# Patient Record
Sex: Female | Born: 1935 | Race: White | Hispanic: No | Smoking: Never smoker
Health system: Southern US, Community
[De-identification: ages and names within clinical notes are randomized; demographics above are authoritative.]

## PROBLEM LIST (undated history)

## (undated) DIAGNOSIS — I428 Other cardiomyopathies: Secondary | ICD-10-CM

## (undated) DIAGNOSIS — N189 Chronic kidney disease, unspecified: Secondary | ICD-10-CM

## (undated) DIAGNOSIS — J45909 Unspecified asthma, uncomplicated: Secondary | ICD-10-CM

## (undated) DIAGNOSIS — R0602 Shortness of breath: Secondary | ICD-10-CM

## (undated) DIAGNOSIS — M1990 Unspecified osteoarthritis, unspecified site: Secondary | ICD-10-CM

## (undated) DIAGNOSIS — M797 Fibromyalgia: Secondary | ICD-10-CM

## (undated) DIAGNOSIS — I10 Essential (primary) hypertension: Secondary | ICD-10-CM

## (undated) DIAGNOSIS — R5382 Chronic fatigue, unspecified: Secondary | ICD-10-CM

## (undated) DIAGNOSIS — M8580 Other specified disorders of bone density and structure, unspecified site: Secondary | ICD-10-CM

## (undated) DIAGNOSIS — J449 Chronic obstructive pulmonary disease, unspecified: Secondary | ICD-10-CM

## (undated) DIAGNOSIS — R140 Abdominal distension (gaseous): Secondary | ICD-10-CM

## (undated) DIAGNOSIS — C3490 Malignant neoplasm of unspecified part of unspecified bronchus or lung: Secondary | ICD-10-CM

## (undated) DIAGNOSIS — K5792 Diverticulitis of intestine, part unspecified, without perforation or abscess without bleeding: Secondary | ICD-10-CM

## (undated) DIAGNOSIS — I447 Left bundle-branch block, unspecified: Secondary | ICD-10-CM

## (undated) DIAGNOSIS — I509 Heart failure, unspecified: Secondary | ICD-10-CM

## (undated) DIAGNOSIS — E790 Hyperuricemia without signs of inflammatory arthritis and tophaceous disease: Secondary | ICD-10-CM

## (undated) DIAGNOSIS — Z95810 Presence of automatic (implantable) cardiac defibrillator: Secondary | ICD-10-CM

## (undated) HISTORY — DX: Chronic kidney disease, unspecified: N18.9

## (undated) HISTORY — DX: Fibromyalgia: M79.7

## (undated) HISTORY — DX: Essential (primary) hypertension: I10

## (undated) HISTORY — DX: Abdominal distension (gaseous): R14.0

## (undated) HISTORY — PX: OTHER SURGICAL HISTORY: SHX169

## (undated) HISTORY — DX: Presence of automatic (implantable) cardiac defibrillator: Z95.810

## (undated) HISTORY — DX: Left bundle-branch block, unspecified: I44.7

## (undated) HISTORY — PX: APPENDECTOMY: SHX54

## (undated) HISTORY — PX: ABDOMINAL HYSTERECTOMY: SUR658

## (undated) HISTORY — PX: TONSILLECTOMY: SUR1361

## (undated) HISTORY — DX: Diverticulitis of intestine, part unspecified, without perforation or abscess without bleeding: K57.92

## (undated) HISTORY — DX: Chronic fatigue, unspecified: R53.82

## (undated) HISTORY — DX: Hyperuricemia without signs of inflammatory arthritis and tophaceous disease: E79.0

## (undated) HISTORY — PX: NECK SURGERY: SHX720

## (undated) HISTORY — DX: Other cardiomyopathies: I42.8

## (undated) HISTORY — PX: REPLACEMENT TOTAL KNEE: SUR1224

## (undated) HISTORY — DX: Shortness of breath: R06.02

## (undated) HISTORY — DX: Other specified disorders of bone density and structure, unspecified site: M85.80

---

## 1997-07-27 ENCOUNTER — Encounter: Admission: RE | Admit: 1997-07-27 | Discharge: 1997-10-25 | Payer: Self-pay | Admitting: Orthopaedic Surgery

## 1998-03-16 ENCOUNTER — Encounter: Payer: Self-pay | Admitting: Emergency Medicine

## 1998-03-16 ENCOUNTER — Emergency Department (HOSPITAL_COMMUNITY): Admission: EM | Admit: 1998-03-16 | Discharge: 1998-03-16 | Payer: Self-pay | Admitting: Emergency Medicine

## 1998-09-28 ENCOUNTER — Ambulatory Visit (HOSPITAL_COMMUNITY): Admission: RE | Admit: 1998-09-28 | Discharge: 1998-09-28 | Payer: Self-pay | Admitting: Gastroenterology

## 1998-10-02 ENCOUNTER — Ambulatory Visit (HOSPITAL_COMMUNITY): Admission: RE | Admit: 1998-10-02 | Discharge: 1998-10-02 | Payer: Self-pay | Admitting: Gastroenterology

## 1998-10-02 ENCOUNTER — Encounter: Payer: Self-pay | Admitting: Gastroenterology

## 1998-11-14 ENCOUNTER — Encounter: Payer: Self-pay | Admitting: Gastroenterology

## 1998-11-14 ENCOUNTER — Ambulatory Visit (HOSPITAL_COMMUNITY): Admission: RE | Admit: 1998-11-14 | Discharge: 1998-11-14 | Payer: Self-pay | Admitting: Gastroenterology

## 1999-03-27 ENCOUNTER — Encounter: Admission: RE | Admit: 1999-03-27 | Discharge: 1999-03-27 | Payer: Self-pay | Admitting: Family Medicine

## 1999-03-27 ENCOUNTER — Encounter: Payer: Self-pay | Admitting: Family Medicine

## 1999-04-03 ENCOUNTER — Encounter: Admission: RE | Admit: 1999-04-03 | Discharge: 1999-04-03 | Payer: Self-pay | Admitting: Family Medicine

## 1999-04-03 ENCOUNTER — Encounter: Payer: Self-pay | Admitting: Family Medicine

## 1999-07-11 ENCOUNTER — Encounter: Admission: RE | Admit: 1999-07-11 | Discharge: 1999-07-11 | Payer: Self-pay | Admitting: Family Medicine

## 1999-07-11 ENCOUNTER — Encounter: Payer: Self-pay | Admitting: Family Medicine

## 2000-07-08 ENCOUNTER — Encounter: Admission: RE | Admit: 2000-07-08 | Discharge: 2000-07-08 | Payer: Self-pay | Admitting: Family Medicine

## 2000-07-08 ENCOUNTER — Encounter: Payer: Self-pay | Admitting: Family Medicine

## 2000-07-13 ENCOUNTER — Encounter: Payer: Self-pay | Admitting: Family Medicine

## 2000-07-13 ENCOUNTER — Encounter: Admission: RE | Admit: 2000-07-13 | Discharge: 2000-07-13 | Payer: Self-pay | Admitting: Family Medicine

## 2000-12-01 ENCOUNTER — Encounter (INDEPENDENT_AMBULATORY_CARE_PROVIDER_SITE_OTHER): Payer: Self-pay | Admitting: Specialist

## 2000-12-01 ENCOUNTER — Ambulatory Visit (HOSPITAL_COMMUNITY): Admission: RE | Admit: 2000-12-01 | Discharge: 2000-12-01 | Payer: Self-pay | Admitting: Gastroenterology

## 2001-05-24 ENCOUNTER — Encounter: Payer: Self-pay | Admitting: Family Medicine

## 2001-05-24 ENCOUNTER — Encounter: Admission: RE | Admit: 2001-05-24 | Discharge: 2001-05-24 | Payer: Self-pay | Admitting: Family Medicine

## 2001-07-19 ENCOUNTER — Encounter: Payer: Self-pay | Admitting: Family Medicine

## 2001-07-19 ENCOUNTER — Encounter: Admission: RE | Admit: 2001-07-19 | Discharge: 2001-07-19 | Payer: Self-pay | Admitting: Family Medicine

## 2001-10-06 ENCOUNTER — Encounter: Payer: Self-pay | Admitting: Family Medicine

## 2001-10-06 ENCOUNTER — Encounter: Admission: RE | Admit: 2001-10-06 | Discharge: 2001-10-06 | Payer: Self-pay | Admitting: Family Medicine

## 2001-12-09 ENCOUNTER — Encounter: Payer: Self-pay | Admitting: Family Medicine

## 2001-12-09 ENCOUNTER — Encounter: Admission: RE | Admit: 2001-12-09 | Discharge: 2001-12-09 | Payer: Self-pay | Admitting: Family Medicine

## 2002-01-24 ENCOUNTER — Encounter: Payer: Self-pay | Admitting: Family Medicine

## 2002-01-24 ENCOUNTER — Encounter: Admission: RE | Admit: 2002-01-24 | Discharge: 2002-01-24 | Payer: Self-pay | Admitting: Family Medicine

## 2002-07-22 ENCOUNTER — Encounter: Admission: RE | Admit: 2002-07-22 | Discharge: 2002-07-22 | Payer: Self-pay | Admitting: Family Medicine

## 2002-07-22 ENCOUNTER — Encounter: Payer: Self-pay | Admitting: Family Medicine

## 2003-01-19 ENCOUNTER — Ambulatory Visit (HOSPITAL_COMMUNITY): Admission: RE | Admit: 2003-01-19 | Discharge: 2003-01-19 | Payer: Self-pay | Admitting: Interventional Cardiology

## 2003-01-19 ENCOUNTER — Encounter: Payer: Self-pay | Admitting: *Deleted

## 2003-01-24 ENCOUNTER — Ambulatory Visit (HOSPITAL_COMMUNITY): Admission: RE | Admit: 2003-01-24 | Discharge: 2003-01-24 | Payer: Self-pay | Admitting: *Deleted

## 2003-01-24 ENCOUNTER — Encounter: Payer: Self-pay | Admitting: *Deleted

## 2003-02-03 ENCOUNTER — Encounter: Payer: Self-pay | Admitting: Family Medicine

## 2003-02-03 ENCOUNTER — Encounter: Admission: RE | Admit: 2003-02-03 | Discharge: 2003-02-03 | Payer: Self-pay | Admitting: Family Medicine

## 2003-02-23 ENCOUNTER — Ambulatory Visit (HOSPITAL_COMMUNITY): Admission: RE | Admit: 2003-02-23 | Discharge: 2003-02-23 | Payer: Self-pay | Admitting: Orthopaedic Surgery

## 2003-07-24 ENCOUNTER — Encounter: Admission: RE | Admit: 2003-07-24 | Discharge: 2003-07-24 | Payer: Self-pay | Admitting: Family Medicine

## 2003-08-22 ENCOUNTER — Encounter: Admission: RE | Admit: 2003-08-22 | Discharge: 2003-08-22 | Payer: Self-pay | Admitting: Family Medicine

## 2004-01-08 ENCOUNTER — Encounter (INDEPENDENT_AMBULATORY_CARE_PROVIDER_SITE_OTHER): Payer: Self-pay | Admitting: Specialist

## 2004-01-08 ENCOUNTER — Ambulatory Visit (HOSPITAL_COMMUNITY): Admission: RE | Admit: 2004-01-08 | Discharge: 2004-01-08 | Payer: Self-pay | Admitting: Gastroenterology

## 2004-02-02 ENCOUNTER — Encounter: Admission: RE | Admit: 2004-02-02 | Discharge: 2004-02-02 | Payer: Self-pay | Admitting: Family Medicine

## 2004-05-21 ENCOUNTER — Inpatient Hospital Stay (HOSPITAL_COMMUNITY): Admission: RE | Admit: 2004-05-21 | Discharge: 2004-05-24 | Payer: Self-pay | Admitting: Orthopaedic Surgery

## 2004-05-22 ENCOUNTER — Ambulatory Visit: Payer: Self-pay | Admitting: Physical Medicine & Rehabilitation

## 2004-05-24 ENCOUNTER — Inpatient Hospital Stay
Admission: RE | Admit: 2004-05-24 | Discharge: 2004-05-31 | Payer: Self-pay | Admitting: Physical Medicine & Rehabilitation

## 2004-08-01 ENCOUNTER — Encounter: Admission: RE | Admit: 2004-08-01 | Discharge: 2004-08-01 | Payer: Self-pay | Admitting: Family Medicine

## 2005-05-22 ENCOUNTER — Encounter: Admission: RE | Admit: 2005-05-22 | Discharge: 2005-05-22 | Payer: Self-pay | Admitting: Family Medicine

## 2005-08-08 ENCOUNTER — Encounter: Admission: RE | Admit: 2005-08-08 | Discharge: 2005-08-08 | Payer: Self-pay | Admitting: Family Medicine

## 2005-12-19 ENCOUNTER — Ambulatory Visit (HOSPITAL_COMMUNITY): Admission: RE | Admit: 2005-12-19 | Discharge: 2005-12-19 | Payer: Self-pay | Admitting: *Deleted

## 2005-12-19 ENCOUNTER — Encounter: Payer: Self-pay | Admitting: Pulmonary Disease

## 2006-01-02 ENCOUNTER — Encounter: Admission: RE | Admit: 2006-01-02 | Discharge: 2006-01-02 | Payer: Self-pay | Admitting: Family Medicine

## 2006-01-15 ENCOUNTER — Ambulatory Visit: Payer: Self-pay | Admitting: Pulmonary Disease

## 2006-01-19 ENCOUNTER — Ambulatory Visit (HOSPITAL_COMMUNITY): Admission: RE | Admit: 2006-01-19 | Discharge: 2006-01-19 | Payer: Self-pay | Admitting: Pulmonary Disease

## 2006-01-19 ENCOUNTER — Encounter: Payer: Self-pay | Admitting: Pulmonary Disease

## 2006-01-20 ENCOUNTER — Ambulatory Visit (HOSPITAL_COMMUNITY): Admission: RE | Admit: 2006-01-20 | Discharge: 2006-01-20 | Payer: Self-pay | Admitting: Pulmonary Disease

## 2006-01-28 ENCOUNTER — Ambulatory Visit: Payer: Self-pay | Admitting: Pulmonary Disease

## 2006-01-29 ENCOUNTER — Ambulatory Visit: Payer: Self-pay | Admitting: Cardiology

## 2006-01-29 ENCOUNTER — Encounter: Payer: Self-pay | Admitting: Pulmonary Disease

## 2006-02-05 ENCOUNTER — Ambulatory Visit: Payer: Self-pay | Admitting: Pulmonary Disease

## 2006-03-09 ENCOUNTER — Ambulatory Visit: Payer: Self-pay | Admitting: Pulmonary Disease

## 2006-03-16 ENCOUNTER — Inpatient Hospital Stay (HOSPITAL_BASED_OUTPATIENT_CLINIC_OR_DEPARTMENT_OTHER): Admission: RE | Admit: 2006-03-16 | Discharge: 2006-03-16 | Payer: Self-pay | Admitting: Interventional Cardiology

## 2006-03-16 ENCOUNTER — Encounter: Payer: Self-pay | Admitting: Pulmonary Disease

## 2006-04-07 ENCOUNTER — Encounter: Payer: Self-pay | Admitting: Pulmonary Disease

## 2006-04-27 ENCOUNTER — Encounter: Admission: RE | Admit: 2006-04-27 | Discharge: 2006-04-27 | Payer: Self-pay | Admitting: Gastroenterology

## 2006-05-12 ENCOUNTER — Ambulatory Visit (HOSPITAL_COMMUNITY): Admission: RE | Admit: 2006-05-12 | Discharge: 2006-05-12 | Payer: Self-pay | Admitting: Gastroenterology

## 2006-08-12 ENCOUNTER — Encounter: Admission: RE | Admit: 2006-08-12 | Discharge: 2006-08-12 | Payer: Self-pay | Admitting: Family Medicine

## 2007-01-11 ENCOUNTER — Encounter: Payer: Self-pay | Admitting: Cardiology

## 2007-01-20 ENCOUNTER — Ambulatory Visit (HOSPITAL_COMMUNITY): Admission: RE | Admit: 2007-01-20 | Discharge: 2007-01-20 | Payer: Self-pay | Admitting: Neurosurgery

## 2007-02-27 HISTORY — PX: NECK SURGERY: SHX720

## 2007-03-10 ENCOUNTER — Inpatient Hospital Stay (HOSPITAL_COMMUNITY): Admission: RE | Admit: 2007-03-10 | Discharge: 2007-03-19 | Payer: Self-pay | Admitting: Neurosurgery

## 2007-03-12 ENCOUNTER — Ambulatory Visit: Payer: Self-pay | Admitting: Physical Medicine & Rehabilitation

## 2007-04-03 ENCOUNTER — Encounter: Payer: Self-pay | Admitting: Pulmonary Disease

## 2007-04-03 DIAGNOSIS — E785 Hyperlipidemia, unspecified: Secondary | ICD-10-CM

## 2007-04-03 DIAGNOSIS — J45909 Unspecified asthma, uncomplicated: Secondary | ICD-10-CM | POA: Insufficient documentation

## 2007-05-21 ENCOUNTER — Encounter: Payer: Self-pay | Admitting: Pulmonary Disease

## 2007-06-24 ENCOUNTER — Encounter: Admission: RE | Admit: 2007-06-24 | Discharge: 2007-09-22 | Payer: Self-pay | Admitting: Neurosurgery

## 2007-08-26 ENCOUNTER — Encounter: Admission: RE | Admit: 2007-08-26 | Discharge: 2007-08-26 | Payer: Self-pay | Admitting: Family Medicine

## 2007-09-23 ENCOUNTER — Encounter: Admission: RE | Admit: 2007-09-23 | Discharge: 2007-10-07 | Payer: Self-pay | Admitting: Neurosurgery

## 2008-07-05 ENCOUNTER — Encounter: Payer: Self-pay | Admitting: Cardiology

## 2008-07-05 ENCOUNTER — Encounter: Payer: Self-pay | Admitting: Internal Medicine

## 2008-07-10 ENCOUNTER — Encounter: Admission: RE | Admit: 2008-07-10 | Discharge: 2008-07-10 | Payer: Self-pay | Admitting: Interventional Cardiology

## 2008-09-20 ENCOUNTER — Encounter: Payer: Self-pay | Admitting: Internal Medicine

## 2008-09-27 ENCOUNTER — Encounter: Admission: RE | Admit: 2008-09-27 | Discharge: 2008-09-27 | Payer: Self-pay | Admitting: Family Medicine

## 2008-09-28 ENCOUNTER — Encounter: Payer: Self-pay | Admitting: Internal Medicine

## 2008-10-04 ENCOUNTER — Encounter: Payer: Self-pay | Admitting: Internal Medicine

## 2008-10-10 ENCOUNTER — Ambulatory Visit: Payer: Self-pay | Admitting: Internal Medicine

## 2008-10-10 DIAGNOSIS — I5022 Chronic systolic (congestive) heart failure: Secondary | ICD-10-CM

## 2008-10-10 DIAGNOSIS — I447 Left bundle-branch block, unspecified: Secondary | ICD-10-CM | POA: Insufficient documentation

## 2008-10-23 ENCOUNTER — Encounter: Payer: Self-pay | Admitting: Internal Medicine

## 2008-10-23 ENCOUNTER — Encounter: Payer: Self-pay | Admitting: Cardiology

## 2008-10-27 ENCOUNTER — Telehealth: Payer: Self-pay | Admitting: Internal Medicine

## 2008-11-06 ENCOUNTER — Encounter: Payer: Self-pay | Admitting: Internal Medicine

## 2008-11-21 ENCOUNTER — Telehealth: Payer: Self-pay | Admitting: Internal Medicine

## 2008-11-22 ENCOUNTER — Ambulatory Visit: Payer: Self-pay | Admitting: Internal Medicine

## 2008-11-22 DIAGNOSIS — R0602 Shortness of breath: Secondary | ICD-10-CM | POA: Insufficient documentation

## 2008-11-22 LAB — CONVERTED CEMR LAB
Basophils Relative: 1 % (ref 0.0–3.0)
CO2: 31 meq/L (ref 19–32)
Chloride: 103 meq/L (ref 96–112)
Eosinophils Absolute: 0.2 10*3/uL (ref 0.0–0.7)
Eosinophils Relative: 2.6 % (ref 0.0–5.0)
HCT: 35.6 % — ABNORMAL LOW (ref 36.0–46.0)
Hemoglobin: 12.3 g/dL (ref 12.0–15.0)
MCHC: 34.5 g/dL (ref 30.0–36.0)
MCV: 95.6 fL (ref 78.0–100.0)
Monocytes Absolute: 0.6 10*3/uL (ref 0.1–1.0)
Neutro Abs: 3.4 10*3/uL (ref 1.4–7.7)
Neutrophils Relative %: 58.7 % (ref 43.0–77.0)
Potassium: 4.3 meq/L (ref 3.5–5.1)
Prothrombin Time: 10.9 s (ref 10.9–13.3)
RBC: 3.73 M/uL — ABNORMAL LOW (ref 3.87–5.11)
Sodium: 140 meq/L (ref 135–145)
WBC: 6 10*3/uL (ref 4.5–10.5)

## 2008-11-29 ENCOUNTER — Inpatient Hospital Stay (HOSPITAL_COMMUNITY): Admission: AD | Admit: 2008-11-29 | Discharge: 2008-11-30 | Payer: Self-pay | Admitting: Internal Medicine

## 2008-11-29 ENCOUNTER — Ambulatory Visit: Payer: Self-pay | Admitting: Internal Medicine

## 2008-11-30 ENCOUNTER — Encounter: Payer: Self-pay | Admitting: Cardiology

## 2008-11-30 ENCOUNTER — Encounter: Payer: Self-pay | Admitting: Internal Medicine

## 2008-12-04 ENCOUNTER — Telehealth (INDEPENDENT_AMBULATORY_CARE_PROVIDER_SITE_OTHER): Payer: Self-pay | Admitting: *Deleted

## 2008-12-04 ENCOUNTER — Telehealth: Payer: Self-pay | Admitting: Internal Medicine

## 2008-12-14 ENCOUNTER — Encounter: Payer: Self-pay | Admitting: Internal Medicine

## 2008-12-14 ENCOUNTER — Ambulatory Visit: Payer: Self-pay

## 2008-12-15 ENCOUNTER — Encounter: Payer: Self-pay | Admitting: Internal Medicine

## 2008-12-20 ENCOUNTER — Telehealth: Payer: Self-pay | Admitting: Internal Medicine

## 2008-12-21 ENCOUNTER — Ambulatory Visit: Payer: Self-pay

## 2008-12-21 ENCOUNTER — Encounter: Payer: Self-pay | Admitting: Internal Medicine

## 2008-12-21 ENCOUNTER — Telehealth (INDEPENDENT_AMBULATORY_CARE_PROVIDER_SITE_OTHER): Payer: Self-pay | Admitting: *Deleted

## 2008-12-22 ENCOUNTER — Telehealth: Payer: Self-pay | Admitting: Internal Medicine

## 2008-12-25 ENCOUNTER — Emergency Department (HOSPITAL_COMMUNITY): Admission: EM | Admit: 2008-12-25 | Discharge: 2008-12-25 | Payer: Self-pay | Admitting: Emergency Medicine

## 2008-12-25 ENCOUNTER — Ambulatory Visit: Payer: Self-pay | Admitting: Internal Medicine

## 2008-12-25 ENCOUNTER — Encounter: Payer: Self-pay | Admitting: Internal Medicine

## 2008-12-25 ENCOUNTER — Telehealth: Payer: Self-pay | Admitting: Nurse Practitioner

## 2008-12-27 ENCOUNTER — Telehealth: Payer: Self-pay | Admitting: Internal Medicine

## 2009-01-03 ENCOUNTER — Telehealth: Payer: Self-pay | Admitting: Internal Medicine

## 2009-01-05 ENCOUNTER — Telehealth (INDEPENDENT_AMBULATORY_CARE_PROVIDER_SITE_OTHER): Payer: Self-pay | Admitting: *Deleted

## 2009-01-08 ENCOUNTER — Telehealth (INDEPENDENT_AMBULATORY_CARE_PROVIDER_SITE_OTHER): Payer: Self-pay | Admitting: *Deleted

## 2009-01-10 ENCOUNTER — Ambulatory Visit: Payer: Self-pay

## 2009-01-10 ENCOUNTER — Ambulatory Visit: Payer: Self-pay | Admitting: Internal Medicine

## 2009-01-10 LAB — CONVERTED CEMR LAB: Pro B Natriuretic peptide (BNP): 62 pg/mL

## 2009-01-18 ENCOUNTER — Encounter: Payer: Self-pay | Admitting: Internal Medicine

## 2009-01-23 ENCOUNTER — Telehealth: Payer: Self-pay | Admitting: Internal Medicine

## 2009-01-25 ENCOUNTER — Telehealth (INDEPENDENT_AMBULATORY_CARE_PROVIDER_SITE_OTHER): Payer: Self-pay | Admitting: *Deleted

## 2009-03-02 ENCOUNTER — Ambulatory Visit: Payer: Self-pay | Admitting: Internal Medicine

## 2009-03-20 ENCOUNTER — Ambulatory Visit: Payer: Self-pay | Admitting: Internal Medicine

## 2009-04-03 ENCOUNTER — Encounter: Payer: Self-pay | Admitting: Internal Medicine

## 2009-05-11 ENCOUNTER — Encounter: Payer: Self-pay | Admitting: Pulmonary Disease

## 2009-06-04 ENCOUNTER — Encounter: Admission: RE | Admit: 2009-06-04 | Discharge: 2009-06-25 | Payer: Self-pay | Admitting: Neurosurgery

## 2009-06-11 ENCOUNTER — Telehealth: Payer: Self-pay | Admitting: Internal Medicine

## 2009-06-13 ENCOUNTER — Ambulatory Visit: Payer: Self-pay

## 2009-06-13 ENCOUNTER — Telehealth (INDEPENDENT_AMBULATORY_CARE_PROVIDER_SITE_OTHER): Payer: Self-pay | Admitting: *Deleted

## 2009-06-18 ENCOUNTER — Encounter: Payer: Self-pay | Admitting: Cardiology

## 2009-07-06 ENCOUNTER — Ambulatory Visit: Payer: Self-pay | Admitting: Cardiology

## 2009-07-10 ENCOUNTER — Encounter: Payer: Self-pay | Admitting: Cardiology

## 2009-07-16 ENCOUNTER — Ambulatory Visit: Payer: Self-pay | Admitting: Cardiology

## 2009-07-20 ENCOUNTER — Encounter: Payer: Self-pay | Admitting: Cardiology

## 2009-07-27 ENCOUNTER — Ambulatory Visit (HOSPITAL_COMMUNITY): Admission: RE | Admit: 2009-07-27 | Discharge: 2009-07-27 | Payer: Self-pay | Admitting: Neurosurgery

## 2009-08-01 ENCOUNTER — Encounter: Payer: Self-pay | Admitting: Cardiology

## 2009-08-06 ENCOUNTER — Ambulatory Visit (HOSPITAL_COMMUNITY): Admission: RE | Admit: 2009-08-06 | Discharge: 2009-08-06 | Payer: Self-pay | Admitting: Cardiology

## 2009-08-06 ENCOUNTER — Encounter: Payer: Self-pay | Admitting: Cardiology

## 2009-08-06 ENCOUNTER — Ambulatory Visit: Payer: Self-pay | Admitting: Cardiology

## 2009-08-06 ENCOUNTER — Ambulatory Visit: Payer: Self-pay | Admitting: Internal Medicine

## 2009-08-06 ENCOUNTER — Ambulatory Visit: Payer: Self-pay

## 2009-08-06 LAB — CONVERTED CEMR LAB
Basophils Relative: 0.7 % (ref 0.0–3.0)
CO2: 32 meq/L (ref 19–32)
Chloride: 104 meq/L (ref 96–112)
Creatinine, Ser: 1.6 mg/dL — ABNORMAL HIGH (ref 0.4–1.2)
Eosinophils Relative: 2.6 % (ref 0.0–5.0)
HCT: 33.6 % — ABNORMAL LOW (ref 36.0–46.0)
Hemoglobin: 11.5 g/dL — ABNORMAL LOW (ref 12.0–15.0)
Lymphs Abs: 1.6 10*3/uL (ref 0.7–4.0)
MCV: 95.9 fL (ref 78.0–100.0)
Monocytes Absolute: 0.6 10*3/uL (ref 0.1–1.0)
Monocytes Relative: 7.3 % (ref 3.0–12.0)
Neutro Abs: 5.9 10*3/uL (ref 1.4–7.7)
Potassium: 4.5 meq/L (ref 3.5–5.1)
Pro B Natriuretic peptide (BNP): 56 pg/mL (ref 0.0–100.0)
WBC: 8.4 10*3/uL (ref 4.5–10.5)

## 2009-08-09 ENCOUNTER — Inpatient Hospital Stay (HOSPITAL_BASED_OUTPATIENT_CLINIC_OR_DEPARTMENT_OTHER): Admission: RE | Admit: 2009-08-09 | Discharge: 2009-08-09 | Payer: Self-pay | Admitting: Cardiology

## 2009-08-27 ENCOUNTER — Ambulatory Visit: Payer: Self-pay | Admitting: Cardiology

## 2009-09-04 ENCOUNTER — Telehealth: Payer: Self-pay | Admitting: Cardiology

## 2009-09-21 ENCOUNTER — Encounter: Payer: Self-pay | Admitting: Internal Medicine

## 2009-09-25 ENCOUNTER — Ambulatory Visit: Payer: Self-pay | Admitting: Internal Medicine

## 2009-09-25 DIAGNOSIS — I959 Hypotension, unspecified: Secondary | ICD-10-CM

## 2009-10-01 ENCOUNTER — Encounter: Payer: Self-pay | Admitting: Cardiology

## 2009-10-01 ENCOUNTER — Ambulatory Visit: Payer: Self-pay | Admitting: Internal Medicine

## 2009-10-01 ENCOUNTER — Ambulatory Visit (HOSPITAL_COMMUNITY): Admission: RE | Admit: 2009-10-01 | Discharge: 2009-10-01 | Payer: Self-pay | Admitting: Cardiology

## 2009-10-04 ENCOUNTER — Encounter: Admission: RE | Admit: 2009-10-04 | Discharge: 2009-10-04 | Payer: Self-pay | Admitting: Family Medicine

## 2009-10-05 ENCOUNTER — Telehealth: Payer: Self-pay | Admitting: Cardiology

## 2009-10-10 ENCOUNTER — Ambulatory Visit: Payer: Self-pay | Admitting: Cardiology

## 2009-10-31 ENCOUNTER — Ambulatory Visit: Payer: Self-pay | Admitting: Pulmonary Disease

## 2009-11-02 ENCOUNTER — Encounter: Payer: Self-pay | Admitting: Pulmonary Disease

## 2009-11-02 ENCOUNTER — Ambulatory Visit: Admission: RE | Admit: 2009-11-02 | Discharge: 2009-11-02 | Payer: Self-pay | Admitting: Pulmonary Disease

## 2009-11-14 ENCOUNTER — Encounter: Payer: Self-pay | Admitting: Pulmonary Disease

## 2009-11-19 ENCOUNTER — Telehealth (INDEPENDENT_AMBULATORY_CARE_PROVIDER_SITE_OTHER): Payer: Self-pay | Admitting: *Deleted

## 2009-11-27 ENCOUNTER — Ambulatory Visit: Payer: Self-pay | Admitting: Cardiology

## 2009-12-06 ENCOUNTER — Ambulatory Visit: Payer: Self-pay | Admitting: Pulmonary Disease

## 2009-12-17 ENCOUNTER — Ambulatory Visit: Payer: Self-pay | Admitting: Internal Medicine

## 2009-12-20 ENCOUNTER — Telehealth: Payer: Self-pay | Admitting: Internal Medicine

## 2009-12-21 ENCOUNTER — Telehealth (INDEPENDENT_AMBULATORY_CARE_PROVIDER_SITE_OTHER): Payer: Self-pay | Admitting: *Deleted

## 2009-12-27 ENCOUNTER — Encounter: Payer: Self-pay | Admitting: Pulmonary Disease

## 2009-12-28 ENCOUNTER — Encounter: Payer: Self-pay | Admitting: Cardiology

## 2010-01-07 ENCOUNTER — Telehealth: Payer: Self-pay | Admitting: Pulmonary Disease

## 2010-01-15 ENCOUNTER — Telehealth (INDEPENDENT_AMBULATORY_CARE_PROVIDER_SITE_OTHER): Payer: Self-pay | Admitting: *Deleted

## 2010-01-16 ENCOUNTER — Ambulatory Visit: Payer: Self-pay | Admitting: Pulmonary Disease

## 2010-01-22 ENCOUNTER — Telehealth: Payer: Self-pay | Admitting: Pulmonary Disease

## 2010-01-22 ENCOUNTER — Telehealth (INDEPENDENT_AMBULATORY_CARE_PROVIDER_SITE_OTHER): Payer: Self-pay | Admitting: *Deleted

## 2010-01-28 ENCOUNTER — Telehealth (INDEPENDENT_AMBULATORY_CARE_PROVIDER_SITE_OTHER): Payer: Self-pay | Admitting: *Deleted

## 2010-02-13 ENCOUNTER — Ambulatory Visit: Payer: Self-pay | Admitting: Pulmonary Disease

## 2010-02-18 ENCOUNTER — Telehealth: Payer: Self-pay | Admitting: Pulmonary Disease

## 2010-03-13 ENCOUNTER — Ambulatory Visit: Payer: Self-pay | Admitting: Internal Medicine

## 2010-03-14 ENCOUNTER — Ambulatory Visit: Payer: Self-pay | Admitting: Cardiology

## 2010-03-14 DIAGNOSIS — R05 Cough: Secondary | ICD-10-CM

## 2010-03-14 DIAGNOSIS — R079 Chest pain, unspecified: Secondary | ICD-10-CM

## 2010-04-01 ENCOUNTER — Ambulatory Visit: Payer: Self-pay | Admitting: Pulmonary Disease

## 2010-05-03 ENCOUNTER — Encounter: Payer: Self-pay | Admitting: Pulmonary Disease

## 2010-05-19 ENCOUNTER — Encounter: Payer: Self-pay | Admitting: Family Medicine

## 2010-05-26 LAB — CONVERTED CEMR LAB
BUN: 21 mg/dL (ref 6–23)
CO2: 30 meq/L (ref 19–32)
Chloride: 103 meq/L (ref 96–112)
Creatinine, Ser: 1.2 mg/dL (ref 0.4–1.2)
Potassium: 5 meq/L (ref 3.5–5.1)
Pro B Natriuretic peptide (BNP): 68 pg/mL (ref 0.0–100.0)

## 2010-05-28 NOTE — Progress Notes (Signed)
Summary: bi pap-LMTCbx1  Phone Note Call from Patient   Caller: Patient Call For: Makayla Lanter Summary of Call: calling about  new nose piece for bi pap . its the same size as her old one what does she need to do with it. Initial call taken by: Rickard Patience,  February 18, 2010 10:17 AM  Follow-up for Phone Call        LMTCbx1. Carron Curie CMA  February 18, 2010 12:02 PM   Pt states mask [facility] gave her is the same as the mask that she already had, did not open same and wanted to know what can she do with it since she will not be using it. I advised pt when she is in our area to drop it off, she agreed to same. Will sign off and forward to [facility] desktop. Pt aware [facility] is out of the office. Zackery Barefoot CMA  February 18, 2010 2:22 PM      Appended Document: bi pap-LMTCbx1 this was a gecko strip for skin breakdown, not a mask.

## 2010-05-28 NOTE — Cardiovascular Report (Signed)
Summary: Heart Failure Program Status Report  Heart Failure Program Status Report   Imported By: Roderic Ovens 07/25/2009 16:23:07  _____________________________________________________________________  External Attachment:    Type:   Image     Comment:   External Document

## 2010-05-28 NOTE — Letter (Signed)
Summary: [facility]  [facility]   Imported By: Sherian Rein 11/08/2009 10:30:49  _____________________________________________________________________  External Attachment:    Type:   Image     Comment:   External Document

## 2010-05-28 NOTE — Letter (Signed)
Summary: [facility] Office Note   [facility] Office Note   Imported By: Roderic Ovens 01/25/2010 15:49:00  _____________________________________________________________________  External Attachment:    Type:   Image     Comment:   External Document

## 2010-05-28 NOTE — Progress Notes (Signed)
Summary: pt has questions  Phone Note Call from Patient Call back at Home Phone [phone]   Caller: Patient Reason for Call: Talk to Nurse, Talk to Doctor Summary of Call: pt has questions regarding her condition and she needs the questions answered before she can go on with something else she needs to do. She will go into more detail when you call her Initial call taken by: Omer Jack,  Sep 04, 2009 9:07 AM  Follow-up for Phone Call        wants to know if she can have a deep tissue message yet.  was told before by Dr Graciela Husbands no because of possible clot which she ruled out for.  Cant have message until MD gives OK because pulmonary and cardiac problems.  It is a deep tissue message Follow-up by: Charolotte Capuchin, RN,  Sep 04, 2009 11:10 AM  Additional Follow-up for Phone Call Additional follow up Details #1::        Can have massage. Additional Follow-up by: Rollene Rotunda, MD, [facility],  Sep 05, 2009 5:14 PM     Appended Document: pt has questions pt aware

## 2010-05-28 NOTE — Progress Notes (Signed)
Summary: low bp  Phone Note Call from Patient Call back at Home Phone [phone]   Reason for Call: Talk to Nurse Complaint: Earache/Ear Infection Summary of Call: low BP 90's/50's, did call primary cardiologist which they aren't doing anything about it, coming in to have pacer check on wednesday, request to be seen by Dr Graciela Husbands then Initial call taken by: Migdalia Dk,  June 11, 2009 9:30 AM  Follow-up for Phone Call        SPOKE WITH PT B/P RUNNING LOW AS LOW AS 72/47 TO AS HIGH AS 119/68. HIGH B/P READING IS FROM 06/11/09 AFTER HOLDING LISINOPRIL 20 MG SINCE 06/06/09. PT C/O FATIGUE AND DIZZINESS HAS APPT WITH YOU ON WED PER PT STATES ALSO FEELS BETER WITH HEART RATES IN THE LOW 60'S.INSTRUCTED PT TO CONT TO HOLD LISINOPRIL UNTIL MESSAGE ADDRESED.VERBALIZED UNDERSTANIDNG. ALSO AWARE TO CALL IF S/S WORSEN.NOT HAPPY WITH OTHER CARDIOLOGISTS. Follow-up by: Scherrie Bateman, LPN,  June 11, 2009 11:00 AM  Additional Follow-up for Phone Call Additional follow up Details #1::        pt returning call, Migdalia Dk  June 11, 2009 2:26 PM  SPOKE WITH PT OTHER NURSE CALLED AND LEFT MESSAGE NOT REALIZING THIS NURSE SPOKE WITH PT PT INSTRUCTED ON WHAT HAD HAPPENED. Additional Follow-up by: Scherrie Bateman, LPN,  June 11, 2009 2:33 PM

## 2010-05-28 NOTE — Progress Notes (Signed)
Summary: breathing problem-orer sent to [facility] to change bipap to 10/6  Phone Note Call from Patient   Caller: Patient Call For: clance Summary of Call: pt calling about breathing problem Initial call taken by: Rickard Patience,  January 22, 2010 1:30 PM  Follow-up for Phone Call        Pt was not informed that [facility] had sent an order to have her bipap setting changed, so I advised pt of this (see phone note from earlier today.) I also advised to call if the changes did not help her.Carron Curie CMA  January 22, 2010 3:21 PM

## 2010-05-28 NOTE — Progress Notes (Signed)
Summary: [facility]   [facility]   Imported By: Roderic Ovens 07/26/2009 15:31:03  _____________________________________________________________________  External Attachment:    Type:   Image     Comment:   External Document

## 2010-05-28 NOTE — Miscellaneous (Signed)
Summary: Orders Update   Clinical Lists Changes  Orders: Added new Referral order of Radiology Referral (Radiology) - Signed 

## 2010-05-28 NOTE — Letter (Signed)
Summary: Cardiac Catheterization Instructions- [facility]  [facility], Main Office  [address]   [place], [place] [address]   Phone: [phone]  Fax: [phone]         07/20/2009 MRN: [id number]  Heather Bullock [address] [place], [place]  [address]  Dear [name],   You are scheduled for a Cardiac Catheterization on 08/09/2009 with Dr.Honest Vanleer  Please arrive to the 1st floor of the Heart and Vascular Center at [facility] at 9:30 am on the day of your procedure. Please do not arrive before 6:30 a.m. Call the Heart and Vascular Center at [phone] if you are unable to make your appointmnet. The Code to get into the parking garage under the building is 0001. Take the elevators to the 1st floor. You must have someone to drive you home. Someone must be with you for the first 24 hours after you arrive home. Please wear clothes that are easy to get on and off and wear slip-on shoes. Do not eat or drink after midnight except water with your medications that morning. Bring all your medications and current insurance cards with you.  ___ DO NOT take these medications before your procedure: ________________________________________________________________  __x_ Make sure you take your aspirin.  __x_ You may take ALL of your medications with water that morning.  Please remember to come to the office for lab work on April 11,2011   The usual length of stay after your procedure is 2 to 3 hours. This can vary.  If you have any questions, please call the office at the number listed above.   Charolotte Capuchin, RN

## 2010-05-28 NOTE — Letter (Signed)
Summary: [facility]  [facility]   Imported By: Sherian Rein 11/01/2009 08:40:52  _____________________________________________________________________  External Attachment:    Type:   Image     Comment:   External Document

## 2010-05-28 NOTE — Progress Notes (Signed)
Summary: results-----requested results.   Phone Note Call from Patient Call back at [facility] Phone [phone]   Caller: Patient Call For: Candelario Steppe Reason for Call: Talk to Nurse Summary of Call: looking for results of nerve conductive test.  Results were to be sent to [facility].  Pt waiting for results.  Dr. Thad Ranger did test, [facility] talked to Dr. Ulla Potash and results were to be sent to [facility]. Initial call taken by: Eugene Gavia,  January 07, 2010 3:16 PM  Follow-up for Phone Call        Aundra Millet, have you seen these results? Pls advise thanks Vernie Murders  January 07, 2010 3:20 PM   Received nerve conducting study from [facility]. The results was put in [facility] very important look at folder [facility]  January 08, 2010 11:26 AM       Additional Follow-up for Phone Call Additional follow up Details #1::        did this pt ever see Dr Debarah Crape, the neuromuscular specialist?  he would have to review the studies with her....did he schedule f/u with her? Additional Follow-up by: Barbaraann Share MD,  January 09, 2010 5:04 PM    Additional Follow-up for Phone Call Additional follow up Details #2::    called and spoke with pt.  pt states she was seen by Dr. Thad Ranger, not Dr. Debarah Crape at [facility].  Pt states a nurse called her about the results and told her "the results were unchanged from the previous neuro test she had done."  Pt states she would like more detailed info regarding this. I informed pt per [facility] she needs to discuss this with Dr. Thad Ranger.  Therefore I encouraged pt to either call [facility] and ask to speak to Dr. Thad Ranger to discuss results in detail or either make an appt with Dr. Thad Ranger to discuss results in person.  Pt verbalized understanding.  Aundra Millet Reynolds LPN  January 09, 2010 5:33 PM    Appended Document: results-----requested results.  please let pt know that I will call dr Thad Ranger since he did not specifically address my question:  Can your spinal cord issue affect your  muscles of breathing?  Hopefully, he will discuss with me, and I am happy to discuss with her.  Appended Document: results-----requested results.  called and spoke with pt.  pt aware  that [facility] will call Dr. Thad Ranger to discuss results and will then discuss with pt.  pt verbalized understanding and will await for [facility] to call her.   Appended Document: results-----requested results.  called and spoke with Dr. Thad Ranger.  He sees most of the issue with c6 and c5, but is unable to test c3,c4.  He does not think she has a myopathy based on signal from infraspinatus.  He cannot exclude a neuromuscular issue such as myasthenia, but is happy to evaluate for this.  I spoke with pt...she either needs to make an apptm with Dr Thad Ranger for further evaluation, or I can refer her to the NM clinic at [facility].  she will call and make appt with Dr Thad Ranger to be eval.  for other NM disease.

## 2010-05-28 NOTE — Assessment & Plan Note (Signed)
Summary: medication titration ok per [facility]   Visit Type:  Follow-up Primary Provider:  Dr.Rob  Ehinger  CC:  CHF.  History of Present Illness: The patient presents for evaluation of dyspnea. At the last appointment I reviewed available data on her previous extensive cardiac workup. She has a dilated cardiomyopathy. She hasn't had improvement despite treatment with resynchronization therapy.  She was switched to carvedilol from Toprol at the last appointment. She's had no improvement in her symptoms. In fact her breathing might be a little worse. She is kept a blood pressure diary and her blood pressures have actually been low. At times they are systolic in the 80s for the entire day. At that point she feels lightheaded and fatigued. She is not describing any new palpitations, presyncope or syncope. She is not describing any new PND or orthopnea. She is unfortunately limited by her symptoms.  Problems Prior to Update: 1)  Automatic Cardiac Crt Stj  (ICD-V45.02) 2)  Cardiomyopathy, Primary, Dilated  (ICD-425.4) 3)  Lbbb  (ICD-426.3) 4)  Systolic Heart Failure, Chronic  (ICD-428.22) 5)  Encounter For Long-term Use of Other Medications  (ICD-V58.69) 6)  Shortness of Breath  (ICD-786.05) 7)  Dyslipidemia  (ICD-272.4) 8)  Asthma, Childhood  (ICD-493.00)  Current Medications (verified): 1)  Mobic 15 Mg  Tabs (Meloxicam) .Marland Kitchen.. 1 Tab Once Daily 2)  Adult Aspirin Ec Low Strength 81 Mg  Tbec (Aspirin) .... Once Daily 3)  Gabapentin 300 Mg Caps (Gabapentin) .... Take One Tab Two Times A Day 4)  Lisinopril 20 Mg Tabs (Lisinopril) .... Once Daily 5)  Furosemide 40 Mg Tabs (Furosemide) .... Take One Twice Daily 6)  Valium 2 Mg Tabs (Diazepam) .... One By Mouth Two Times A Day 7)  Hydrocodone-Acetaminophen 2.5-500 Mg Tabs (Hydrocodone-Acetaminophen) .... As Needed 8)  Multivitamins  Tabs (Multiple Vitamin) .... Once Daily 9)  Prevacid 15 Mg Cpdr (Lansoprazole) .Marland Kitchen.. 1 Tab Once Daily 10)  Osteo Bi-Flex  Triple Strength  Tabs (Misc Natural Products) .Marland Kitchen.. 1 Tab Once Daily 11)  Vitamin D (Ergocalciferol) 50000 Unit Caps (Ergocalciferol) .Marland Kitchen.. 1 Tab Weekly 12)  Vitamin C 500 Mg Tabs (Ascorbic Acid) .... 2 Tabs Once Daily 13)  Calcium Citrate 950 Mg Tabs (Calcium Citrate) .Marland Kitchen.. 1 Tab Once Daily 14)  Pravastatin Sodium 40 Mg Tabs (Pravastatin Sodium) .... One By Mouth Daily 15)  Dulcolax 5 Mg Tbec (Bisacodyl) .Marland Kitchen.. 1 By Mouth Dailyu 16)  Stool Softener 100 Mg Caps (Docusate Sodium) .Marland Kitchen.. 1 Po Daily 17)  Carvedilol 3.125 Mg Tabs (Carvedilol) .... One Two Times A Day 18)  Flector 1.3 % Ptch (Diclofenac Epolamine) .... As Needed For Back Pain  Allergies: 1)  ! Neosporin 2)  ! Reglan 3)  ! * Bacitracin Ointment 4)  ! Relafen 5)  ! * Artho Complex  Past History:  Past Medical History: Reviewed history from 07/06/2009 and no changes required. LBBB Nonischemic cardiomyopathy (EF 25-30% echo 2010) Hypertension Asthma Diverticulitis  Past Surgical History: Reviewed history from 07/06/2009 and no changes required. CRT-D-[employer] Knee replacement Hysterectomy Appendectomy Neck surgery associated with right arm weakness  Review of Systems       As stated in the HPI and negative for all other systems.   Vital Signs:  Patient profile:   77 year old female Height:      63 inches Weight:      172 pounds BMI:     30.58 Pulse rate:   66 / minute BP sitting:   100 / 62  (  left arm) Cuff size:   large  Vitals Entered By: Oswald Hillock (July 16, 2009 2:26 PM)  Physical Exam  General:  Well developed, well nourished, in no acute distress. Head:  normocephalic and atraumatic Eyes:  PERRLA/EOM intact; conjunctiva and lids normal. Mouth:  Teeth, gums and palate normal. Oral mucosa normal. Neck:  Neck supple, no JVD. No masses, thyromegaly or abnormal cervical nodes. Chest Wall:  no deformities or breast masses noted Lungs:  Clear bilaterally to auscultation and percussion. Abdomen:   Bowel sounds positive; abdomen soft and non-tender without masses, organomegaly, or hernias noted. No hepatosplenomegaly. Msk:  Back normal, normal gait. Muscle strength and tone normal. Extremities:  No clubbing or cyanosis. Neurologic:  Alert and oriented x 3. Skin:  Intact without lesions or rashes. Cervical Nodes:  no significant adenopathy Axillary Nodes:  no significant adenopathy Inguinal Nodes:  no significant adenopathy Psych:  Normal affect.   Detailed Cardiovascular Exam  Neck    Carotids: Carotids full and equal bilaterally without bruits.      Neck Veins: Normal, no JVD.    Heart    Inspection: no deformities or lifts noted.      Palpation: normal PMI with no thrills palpable.      Auscultation: regular rate and rhythm, S1, S2 without murmurs, rubs, gallops, or clicks.    Vascular    Abdominal Aorta: no palpable masses, pulsations, or audible bruits.      Femoral Pulses: normal femoral pulses bilaterally.      Pedal Pulses: normal pedal pulses bilaterally.      Radial Pulses: normal radial pulses bilaterally.      Peripheral Circulation: no clubbing, cyanosis, or edema noted with normal capillary refill.      ICD Specifications Following MD:  Sherryl Manges, MD     ICD Vendor:  [facility] [employer]     ICD Model Number:  ZO1096-04     ICD Serial Number:  [id number] ICD DOI:  11/29/2008     ICD Implanting MD:  Sherryl Manges, MD  Lead 1:    Location: RA     DOI: 11/29/2008     Model #: [id number]     Serial #: [id number]     Status: active Lead 2:    Location: RV     DOI: 11/29/2008     Model #: [id number]     Serial #: [id number]     Status: active Lead 3:    Location: LV     DOI: 11/29/2008     Model #: [id number]     Serial #: [id number]     Status: active  ICD Follow Up ICD Dependent:  No       ICD Device Measurements Configuration: BIPOLAR  Episodes Coumadin:  No  Brady Parameters Mode DDD     Lower Rate Limit:  60     Upper Rate Limit 120 PAV 140     Sensed AV Delay:  110  Tachy  Zones VF:  240     VT:  214     VT1:  171     Impression & Recommendations:  Problem # 1:  CARDIOMYOPATHY, PRIMARY, DILATED (ICD-425.4) She has significant symptoms as described. At this point I will repeat a right heart catheterization with left ventricular pressure. This has not been done since 2007. At that point I will do a shunt run. For now she will continue the meds as listed though she will hold carvedilol on the days that her blood pressure is  less than 90. I will check with her upcoming blood work a BNP level as well.  Problem # 2:  AUTOMATIC  CARDIAC CRT  STJ (ICD-V45.02) She is followed in the EP clinic and has had optimaazation of her device without improvement.  Problem # 3:  SHORTNESS OF BREATH (ICD-786.05) As above. I will also consider cardiopulmonary stress testing if the above testing is not helpful in identifying her very limiting symptom of dyspnea Orders: Echocardiogram (Echo)  Other Orders: EKG w/ Interpretation (93000)  Patient Instructions: 1)  Your physician recommends that you schedule a follow-up appointment after cardiac cath. 2)  Your physician recommends that you return for lab work before cath.  BNP,BMP,CBC, PT, PTT 3)  Your physician recommends that you continue on your current medications as directed. Please refer to the Current Medication list given to you today. 4)  Your physician has requested that you have an echocardiogram.  Echocardiography is a painless test that uses sound waves to create images of your heart. It provides your doctor with information about the size and shape of your heart and how well your heart's chambers and valves are working.  This procedure takes approximately one hour. There are no restrictions for this procedure. 5)  Your physician has requested that you have a cardiac catheterization.  Cardiac catheterization is used to diagnose and/or treat various heart conditions. Doctors may recommend this procedure for a number of  different reasons. The most common reason is to evaluate chest pain. Chest pain can be a symptom of coronary artery disease (CAD), and cardiac catheterization can show whether plaque is narrowing or blocking your heart's arteries. This procedure is also used to evaluate the valves, as well as measure the blood flow and oxygen levels in different parts of your heart.  For further information please visit [email or web address]  Please follow instruction sheet, as given.

## 2010-05-28 NOTE — Assessment & Plan Note (Signed)
Summary: per check out/sf   Visit Type:  Follow-up Primary Provider:  Dr. [name]  CC:  Dyspnea.  History of Present Illness: The patient returns for followup of this. She has had an extensive workup as previously described. Unfortunately are not seeing an easily reversible etiology for this. Since I last saw her she had a cardiopulmonary stress test. She had a very poor exercise tolerance probably because of back problems. There was no significant cardiopulmonary limitation. There was a question as to whether or she had RV pacing not CRT pacing with exercise. She continues to be dyspneic with mild activity which is limiting her. However, also limiting her is her severe back pain. She did see Dr. Graciela Husbands since I last saw her. There was a question as to whether she might need some adjustment to her CRT programming. There was also mention of possible referral to Dr. clamps. Finally the patient had been having some low blood pressures. Dr. Graciela Husbands move her lisinopril dosing to nighttime.  I review a blood pressure diary. Her blood pressure does fluctuate. She holds her lisinopril if she notices it to be less than 100. She does have some blood pressures though with systolics in the 130s. She's had no presyncope or syncope.  Current Medications (verified): 1)  Adult Aspirin Ec Low Strength 81 Mg  Tbec (Aspirin) .... Once Daily 2)  Gabapentin 300 Mg Caps (Gabapentin) .... Take One Tab Two Times A Day 3)  Lisinopril 20 Mg Tabs (Lisinopril) .... Take 1 Tablet By Mouth At Bedtime 4)  Furosemide 40 Mg Tabs (Furosemide) .... Take One Twice Daily 5)  Valium 5 Mg Tabs (Diazepam) .... Take One At Bedtime 6)  Hydrocodone-Acetaminophen 2.5-500 Mg Tabs (Hydrocodone-Acetaminophen) .... As Needed 7)  Multivitamins  Tabs (Multiple Vitamin) .... Once Daily 8)  Prevacid 15 Mg Cpdr (Lansoprazole) .Marland Kitchen.. 1 Tab Once Daily 9)  Osteo Bi-Flex Triple Strength  Tabs (Misc Natural Products) .Marland Kitchen.. 1 Tab Once Daily 10)  Vitamin D  (Ergocalciferol) 50000 Unit Caps (Ergocalciferol) .Marland Kitchen.. 1 Tab Weekly 11)  Vitamin C 500 Mg Tabs (Ascorbic Acid) .... 2 Tabs Once Daily 12)  Calcium Citrate 950 Mg Tabs (Calcium Citrate) .Marland Kitchen.. 1 Tab Once Daily 13)  Pravastatin Sodium 40 Mg Tabs (Pravastatin Sodium) .... One By Mouth Daily 14)  Dulcolax 5 Mg Tbec (Bisacodyl) .Marland Kitchen.. 1 By Mouth Dailyu 15)  Stool Softener 100 Mg Caps (Docusate Sodium) .Marland Kitchen.. 1 Po Daily 16)  Carvedilol 3.125 Mg Tabs (Carvedilol) .... One Two Times A Day 17)  Flector 1.3 % Ptch (Diclofenac Epolamine) .... As Needed For Back Pain  Allergies (verified): 1)  ! Neosporin 2)  ! Reglan 3)  ! * Bacitracin Ointment 4)  ! Relafen 5)  ! * Artho Complex  Past History:  Past Medical History: Reviewed history from 07/06/2009 and no changes required. LBBB Nonischemic cardiomyopathy (EF 25-30% echo 2010) Hypertension Asthma Diverticulitis  Past Surgical History: Reviewed history from 07/06/2009 and no changes required. CRT-D-[employer] Knee replacement Hysterectomy Appendectomy Neck surgery associated with right arm weakness  Review of Systems       As stated in the HPI and negative for all other systems.   Vital Signs:  Patient profile:   77 year old female Height:      63 inches Weight:      173 pounds BMI:     30.76 Pulse rate:   63 / minute Resp:     18 per minute BP sitting:   110 / 62  (  right arm)  Vitals Entered By: Marrion Coy, CNA (October 10, 2009 11:22 AM)  Physical Exam  General:  Well developed, well nourished, in no acute distress. Head:  normocephalic and atraumatic Neck:  Neck supple, no JVD. No masses, thyromegaly or abnormal cervical nodes. Chest Wall:  no deformities or breast masses noted, well-healed device pocket Lungs:  Clear bilaterally to auscultation and percussion. Abdomen:  Bowel sounds positive; abdomen soft and non-tender without masses, organomegaly, or hernias noted. No hepatosplenomegaly. Msk:  Back normal, normal gait.  Muscle strength and tone normal. Extremities:  No clubbing or cyanosis. Neurologic:  Alert and oriented x 3. Skin:  Intact without lesions or rashes. Psych:  Normal affect.   Detailed Cardiovascular Exam  Neck    Carotids: Carotids full and equal bilaterally without bruits.      Neck Veins: Normal, no JVD.    Heart    Inspection: no deformities or lifts noted.      Palpation: normal PMI with no thrills palpable.      Auscultation: regular rate and rhythm, S1, S2 without murmurs, rubs, gallops, or clicks.    Vascular    Abdominal Aorta: no palpable masses, pulsations, or audible bruits.      Femoral Pulses: normal femoral pulses bilaterally.      Pedal Pulses: normal pedal pulses bilaterally.      Radial Pulses: normal radial pulses bilaterally.      Peripheral Circulation: no clubbing, cyanosis, or edema noted with normal capillary refill.     EKG  Procedure date:  10/10/2009  Findings:      Sinus rhythm with ventricular pacing 100% capture   ICD Specifications Following MD:  Sherryl Manges, MD     ICD Vendor:  [employer]     ICD Model Number:  [id number]     ICD Serial Number:  [id number] ICD DOI:  11/29/2008     ICD Implanting MD:  Sherryl Manges, MD  Lead 1:    Location: RA     DOI: 11/29/2008     Model #: [id number]     Serial #: [id number]     Status: active Lead 2:    Location: RV     DOI: 11/29/2008     Model #: 7120     Serial #: [id number]     Status: active Lead 3:    Location: LV     DOI: 11/29/2008     Model #: 1158T     Serial #: [id number]     Status: active  ICD Follow Up ICD Dependent:  No       ICD Device Measurements Configuration: BIPOLAR  Episodes Coumadin:  No  Brady Parameters Mode DDD     Lower Rate Limit:  60     Upper Rate Limit 120 PAV 140     Sensed AV Delay:  110  Tachy Zones VF:  240     VT:  214     VT1:  171     Impression & Recommendations:  Problem # 1:  HYPOTENSION (ICD-458.9) I reviewed the blood pressures. I might suggest reducing her  lisinopril as well. Certainly we can titrate any meds upward. She does not want to reduce the lisinopril because of occasional high readings but would rather continue to dose herself not taking it if she reported below systolic 100.  Problem # 2:  SHORTNESS OF BREATH (ICD-786.05) This is of course a predominant complaint. At Dr. Odessa Fleming suggestion I will refer her back to see  Dr. Shelle Iron to see if there might be a pulmonary component.  In addition I said a flag to Dr. Graciela Husbands to ask whether he thought any reprogramming of her CRT device might be in order particularly in light of the fact that there was a question of pacing from the RV with exercise. Orders: Pulmonary Referral (Pulmonary)  Other Orders: EKG w/ Interpretation (93000)  Patient Instructions: 1)  Your physician recommends that you schedule a follow-up appointment in: Feb 2012 to see Dr Antoine Poche 2)  Your physician recommends that you continue on your current medications as directed. Please refer to the Current Medication list given to you today. 3)  You have been referred to Dr Shelle Iron for shortness of breath

## 2010-05-28 NOTE — Progress Notes (Signed)
Summary: referral/cpap  Phone Note Call from Patient Call back at Home Phone [phone]   Caller: Patient Call For: clance Reason for Call: Talk to Nurse Summary of Call: Patient calling about referral to dr Thad Ranger and also cpap machine. Initial call taken by: Lehman Prom,  January 15, 2010 3:01 PM  Follow-up for Phone Call        Called and spoke with pt.  She states that she is still waiting to hear back from Dr Thad Ranger to make followup with him.  She states that she plans to call them back today about this.  She states that [facility] had told her about "face mask" that would help with her breathing.  She states that if this is something he still wants her to have, she would like order for this.  Pls advise thanks Follow-up by: Vernie Murders,  January 15, 2010 3:22 PM  Additional Follow-up for Phone Call Additional follow up Details #1::        I would like to talk with her a little more about this.  See if she will come in to see me for ov to discuss. Additional Follow-up by: Barbaraann Share MD,  January 15, 2010 4:22 PM    Additional Follow-up for Phone Call Additional follow up Details #2::    Spoke with pt and sched ov with [facility] for tommorrow at 3 pm. Follow-up by: Vernie Murders,  January 15, 2010 4:27 PM

## 2010-05-28 NOTE — Procedures (Signed)
Summary: Cardiology Device Clinic   Current Medications (verified): 1)  Adult Aspirin Ec Low Strength 81 Mg  Tbec (Aspirin) .... Once Daily 2)  Gabapentin 300 Mg Caps (Gabapentin) .... Take One Tab Two Times A Day 3)  Lisinopril 10 Mg Tabs (Lisinopril) .Marland Kitchen.. 1 Once Daily 4)  Furosemide 40 Mg Tabs (Furosemide) .... Take One Twice Daily 5)  Valium 5 Mg Tabs (Diazepam) .... Take 1/2 Tab By Mouth Two Times A Day 6)  Hydrocodone-Acetaminophen 10-500 Mg Tabs (Hydrocodone-Acetaminophen) .... Take 1 Tab By Mouth At Bedtime and As Needed 7)  Multivitamins  Tabs (Multiple Vitamin) .... Once Daily 8)  Prevacid 15 Mg Cpdr (Lansoprazole) .Marland Kitchen.. 1 Tab Once Daily 9)  Osteo Bi-Flex Triple Strength  Tabs (Misc Natural Products) .... 2 Tab Once Daily 10)  Vitamin D (Ergocalciferol) 50000 Unit Caps (Ergocalciferol) .Marland Kitchen.. 1 Tab Weekly 11)  Vitamin C 500 Mg Tabs (Ascorbic Acid) .... 2 Tabs Once Daily 12)  Calcium Citrate 950 Mg Tabs (Calcium Citrate) .Marland Kitchen.. 1 Tab Once Daily 13)  Pravastatin Sodium 40 Mg Tabs (Pravastatin Sodium) .... One By Mouth Daily 14)  Dulcolax 5 Mg Tbec (Bisacodyl) .Marland Kitchen.. 1 By Mouth Daily As Needed 15)  Vegetable Laxative Otc .... Take 1 Tablet By Mouth Once A Day As Needed 16)  Carvedilol 3.125 Mg Tabs (Carvedilol) .... One Two Times A Day 17)  Flector 1.3 % Ptch (Diclofenac Epolamine) .... As Needed For Back Pain  Allergies (verified): 1)  ! Neosporin 2)  ! Reglan 3)  ! * Bacitracin Ointment 4)  ! Relafen 5)  ! * Artho Complex 6)  ! * Non Steriodal Anti Inflammatories 7)  ! * Nitroglycerin   ICD Specifications Following MD:  Sherryl Manges, MD     ICD Vendor:  [facility]     ICD Model Number:  ZO1096-04     ICD Serial Number:  [id number] ICD DOI:  11/29/2008     ICD Implanting MD:  Sherryl Manges, MD  Lead 1:    Location: RA     DOI: 11/29/2008     Model #: [id number]     Serial #: [id number]     Status: active Lead 2:    Location: RV     DOI: 11/29/2008     Model #: 7120     Serial #: [id number]      Status: active Lead 3:    Location: LV     DOI: 11/29/2008     Model #: 1158T     Serial #: [id number]     Status: active  ICD Follow Up Remote Check?  No Charge Time:  8.9 seconds     Battery Est. Longevity:  7.2years Underlying rhythm:  SR ICD Dependent:  No       ICD Device Measurements Atrium:  Amplitude: 1.6 mV, Impedance: 580 ohms, Threshold: 0.5 V at 0.4 msec Right Ventricle:  Amplitude: 12 mV, Impedance: 630 ohms, Threshold: 0.625 V at 0.4 msec Left Ventricle:  Impedance: 910 ohms, Threshold: 1.5 V at 0.4 msec Configuration: BIPOLAR Shock Impedance: 48 ohms   Episodes MS Episodes:  0     Percent Mode Switch:  0     Coumadin:  No Shock:  0     ATP:  0     Nonsustained:  0     Atrial Pacing:  37%     Ventricular Pacing:  <1%  Brady Parameters Mode DDD     Lower Rate Limit:  60  Upper Rate Limit 120 PAV 140     Sensed AV Delay:  110  Tachy Zones VF:  240     VT:  214     VT1:  171     Next Cardiology Appt Due:  05/29/2010 Tech Comments:  No parameter changes.  Device fucntion normal.  Ms. Pflum continues to c/o SOB witih minimal exertion.  At her last visit Dr. Graciela Husbands turned off her ventricular pacing.  She hasn't noticed any difference so therefore I left the programming as is.  ROV 3months with the clinic. Altha Harm, LPN  March 13, 2010 11:26 AM

## 2010-05-28 NOTE — Letter (Signed)
Summary[facility]oraha Sprang Physician Office Note  [facility] Office Note   Imported By: Roderic Ovens 07/13/2009 13:34:14  _____________________________________________________________________  External Attachment:    Type:   Image     Comment:   External Document

## 2010-05-28 NOTE — Progress Notes (Signed)
Summary: still SOB on BIPAP  Phone Note Call from Patient Call back at Home Phone [phone]   Caller: Patient Call For: clance Summary of Call: Pt states she was given bipap for her sob, but she isn't any better, also says the bridge of her nose is broken out from the cpap mask, pls advise.//[email or web address] Initial call taken by: Darletta Moll,  January 28, 2010 9:39 AM  Follow-up for Phone Call        Spoke with pt.  She states that her mask that she is using is causing red, scaly rash across the bridge of her nose.  She states that she has tried "gel" given to her by [facility], but this makes mask slide off of her face.  She states that she feels that her breathing has improved at night, but is still strugglin with breathing during the day- no better or worse than when last seen.  She states that she has a nurse at [facility] coming out to visit her on 01/30/10, so if there are any adjustments needed she asks that you send order today.  Pls advise thanks Follow-up by: Vernie Murders,  January 28, 2010 9:58 AM  Additional Follow-up for Phone Call Additional follow up Details #1::        they need to look at a different mask for her!  regarding her breathing, there is nothing else to do from a pulmonary standpoint since this is not a lung disease.  I would give this a little more time after wearing consistently all night long and see if things improve.  let me know. Additional Follow-up by: Barbaraann Share MD,  January 28, 2010 5:41 PM    Additional Follow-up for Phone Call Additional follow up Details #2::    Called, spoke with pt.  She was informed of above per [facility] and verbalized understanding Follow-up by: Gweneth Dimitri RN,  January 28, 2010 5:44 PM

## 2010-05-28 NOTE — Procedures (Signed)
Summary: device check   Allergies: 1)  ! Neosporin 2)  ! Reglan 3)  ! * Bacitracin Ointment 4)  ! Relafen 5)  ! * Artho Complex 6)  ! * Non Steriodal Anti Inflammatories 7)  ! * Nitroglycerin   ICD Specifications Following MD:  Sherryl Manges, MD     ICD Vendor:  [employer]     ICD Model Number:  UX3235-57     ICD Serial Number:  [id number] ICD DOI:  11/29/2008     ICD Implanting MD:  Sherryl Manges, MD  Lead 1:    Location: RA     DOI: 11/29/2008     Model #: [id number]     Serial #: [id number]     Status: active Lead 2:    Location: RV     DOI: 11/29/2008     Model #: 7120     Serial #: [id number]     Status: active Lead 3:    Location: LV     DOI: 11/29/2008     Model #: 1158T     Serial #: [id number]     Status: active  ICD Follow Up ICD Dependent:  No       ICD Device Measurements Configuration: BIPOLAR  Episodes Coumadin:  No  Brady Parameters Mode DDD     Lower Rate Limit:  60     Upper Rate Limit 120 PAV 140     Sensed AV Delay:  110  Tachy Zones VF:  240     VT:  214     VT1:  171

## 2010-05-28 NOTE — Progress Notes (Signed)
Summary: returning a call  Phone Note Call from Patient Call back at Home Phone [phone]   Caller: Patient Call For: clance Summary of Call: claims someone called her about an appt with dr dohmeir who [facility] called pt is waiting on an appt with then i cant find anything in the system Initial call taken by: Lacinda Axon,  December 21, 2009 11:18 AM  Follow-up for Phone Call        No documentation of our staff attempting to contact pt. I called Dr. Oliva Bustard office and spoke with Nicki Guadalajara who advised no one from their office has tried to contact pt. Pt was last see by Darrol Angel November 23, 2009. Pt has no pending appointments with Dr. Vickey Huger. Called and spoke with pt who advised KC was to call and speak to Dr. Vickey Huger to have additional evaluation on pt. Pt states she has called Dr. Algis Downs office and was told they have not received paperwork for [facility] office to schedule appointment. Will forward to [facility] for review. Please advise. Thanks. Zackery Barefoot CMA  December 21, 2009 12:12 PM   Additional Follow-up for Phone Call Additional follow up Details #1::        I did speak with Dr. Vickey Huger, and she offered her an apptm with Dr Debarah Crape who does neuromuscular disease.  I sent the time and date of the apptm to [facility] by flag, asking them to contact pt and let her know.  Unsure if this ever happened.  Someone may have called her about this apptm.  please check and see if she was set up with Dr. Debarah Crape. Additional Follow-up by: Barbaraann Share MD,  December 21, 2009 5:20 PM    Additional Follow-up for Phone Call Additional follow up Details #2::    PCCs, pls advise if you received the flag from [facility] regarding this appt and if so, was the pt contacted about this?  Thanks! Gweneth Dimitri RN  December 21, 2009 5:24 PM    waiting on call back from [facility] Oneita Jolly  December 24, 2009 11:13 AM  spoke to [email or web address] she will get an order from her to make this pt and appt to see dr yen Follow-up by: Oneita Jolly,  December 24, 2009 4:03 PM

## 2010-05-28 NOTE — Progress Notes (Signed)
Summary: ct  Phone Note Call from Patient Call back at Home Phone [phone]   Caller: Patient Call For: clance Reason for Call: Talk to Nurse Summary of Call: pt says no one has called her re: her CT. Initial call taken by: Eugene Gavia,  November 19, 2009 1:59 PM  Follow-up for Phone Call        called spoke with patient who states she was not contacted with her CT appt.  per EMR, Almyra Free spoke with patient regarding this on 7.20.11.  either way, gave pt the date/time - pt would like this rescheduled.  she states any day next week will be fine except for tuesday.  will forward message to [facility].  Follow-up by: Boone Master CNA/MA,  November 19, 2009 2:06 PM  Additional Follow-up for Phone Call Additional follow up Details #1::        appt @[facility]  11/27/09@11 :00am pt is aware of this appt  Additional Follow-up by: Oneita Jolly,  November 19, 2009 3:53 PM

## 2010-05-28 NOTE — Assessment & Plan Note (Signed)
Summary: rov to discuss ct and pft results.   Copy to:  Dr. Antoine Poche Primary Provider/Referring Provider:  Dr. [name]  CC:  Pt is here for a f/u appt to discuss PFT and CT results.  .  History of Present Illness: The pt comes in today for f/u of her recent ct chest and repeat pfts for evaluation of dyspnea.  Her chest ct showed no interstitial disease, and only mild LLL peribronchial thickening with a few scatterred nodules that are unchanged from 2007 (pt live in [place] for 81yrs).  Her pfts showed no obstruction, very mild restriction, mild airtrapping (also on pfts in past but no response to a trial of bronchodilator regimen).  Her TLC is actually improved from prior.  Her dlco was moderately reduced, but corrected with Av.  This is not unexpected given her h/o cardiac disease.  The pt was unable to do post bronchodilator spirometry and muscle pressure evaluation due to severe fatigue.  I have reviewed all of the studies with her in detail, and answered all of her questions.  Current Medications (verified): 1)  Adult Aspirin Ec Low Strength 81 Mg  Tbec (Aspirin) .... Once Daily 2)  Gabapentin 300 Mg Caps (Gabapentin) .... Take One Tab Two Times A Day 3)  Lisinopril 20 Mg Tabs (Lisinopril) .... Take 1 Tablet By Mouth At Bedtime 4)  Furosemide 40 Mg Tabs (Furosemide) .... Take One Twice Daily 5)  Valium 5 Mg Tabs (Diazepam) .... Take 1/2 Tab By Mouth Two Times A Day 6)  Hydrocodone-Acetaminophen 10-500 Mg Tabs (Hydrocodone-Acetaminophen) .... Take 1 Tab By Mouth At Bedtime and As Needed 7)  Multivitamins  Tabs (Multiple Vitamin) .... Once Daily 8)  Prevacid 15 Mg Cpdr (Lansoprazole) .Marland Kitchen.. 1 Tab Once Daily 9)  Osteo Bi-Flex Triple Strength  Tabs (Misc Natural Products) .... 2 Tab Once Daily 10)  Vitamin D (Ergocalciferol) 50000 Unit Caps (Ergocalciferol) .Marland Kitchen.. 1 Tab Weekly 11)  Vitamin C 500 Mg Tabs (Ascorbic Acid) .... 2 Tabs Once Daily 12)  Calcium Citrate 950 Mg Tabs (Calcium Citrate)  .Marland Kitchen.. 1 Tab Once Daily 13)  Pravastatin Sodium 40 Mg Tabs (Pravastatin Sodium) .... One By Mouth Daily 14)  Dulcolax 5 Mg Tbec (Bisacodyl) .Marland Kitchen.. 1 By Mouth Daily As Needed 15)  Vegetable Laxative Otc .... Take 1 Tablet By Mouth Once A Day 16)  Carvedilol 3.125 Mg Tabs (Carvedilol) .... One Two Times A Day 17)  Flector 1.3 % Ptch (Diclofenac Epolamine) .... As Needed For Back Pain  Allergies (verified): 1)  ! Neosporin 2)  ! Reglan 3)  ! * Bacitracin Ointment 4)  ! Relafen 5)  ! * Artho Complex  Review of Systems       The patient complains of shortness of breath with activity, shortness of breath at rest, chest pain, hand/feet swelling, and joint stiffness or pain.  The patient denies productive cough, non-productive cough, coughing up blood, irregular heartbeats, acid heartburn, indigestion, loss of appetite, weight change, abdominal pain, difficulty swallowing, sore throat, tooth/dental problems, headaches, nasal congestion/difficulty breathing through nose, sneezing, itching, ear ache, anxiety, depression, rash, change in color of mucus, and fever.    Vital Signs:  Patient profile:   77 year old female Height:      63 inches Weight:      174 pounds BMI:     30.93 O2 Sat:      93 % on Room air Temp:     98.2 degrees F oral Pulse rate:   63 /  minute BP sitting:   118 / 64  (right arm) Cuff size:   regular  Vitals Entered By: Arman Filter LPN (December 06, 2009 10:50 AM)  O2 Flow:  Room air CC: Pt is here for a f/u appt to discuss PFT and CT results.   Comments Medications reviewed with patient Arman Filter LPN  December 06, 2009 10:56 AM    Physical Exam  General:  ow female in nad Lungs:  decreased diaphragmatic excursion, but clear lung fields. Heart:  rrr Extremities:  mild edema, no cyanosis Neurologic:  alert and oriented, moves all 4.   Impression & Recommendations:  Problem # 1:  SHORTNESS OF BREATH (ICD-786.05) the pt's ct shows no evidence for ISLD, and  therefore I suspect the mild restriction on her pfts is due to either her obesity or some type of muscle weakness.  I do not see any other pulmonary abnormality to explain her dyspnea.  I have been concerned from the beginning about some type of neuromuscular weakness related to her cervical spine disease and surgery.  She also has voice changes/weakness that have remained unexplained.  I will discuss her case with Dr. Vickey Huger, and see if she would be willing to evaluate again.    Medications Added to Medication List This Visit: 1)  Valium 5 Mg Tabs (Diazepam) .... Take 1/2 tab by mouth two times a day 2)  Hydrocodone-acetaminophen 10-500 Mg Tabs (Hydrocodone-acetaminophen) .... Take 1 tab by mouth at bedtime and as needed 3)  Osteo Bi-flex Triple Strength Tabs (Misc natural products) .... 2 tab once daily 4)  Dulcolax 5 Mg Tbec (Bisacodyl) .Marland Kitchen.. 1 by mouth daily as needed 5)  Vegetable Laxative Otc  .... Take 1 tablet by mouth once a day  Other Orders: Est. Patient Level III (36644)  Patient Instructions: 1)  will call Dr Vickey Huger and see about further neurological evaluation.

## 2010-05-28 NOTE — Cardiovascular Report (Signed)
Summary: Heart Failure Program Summary Report  Heart Failure Program Summary Report   Imported By: Roderic Ovens 08/14/2009 11:22:01  _____________________________________________________________________  External Attachment:    Type:   Image     Comment:   External Document

## 2010-05-28 NOTE — Progress Notes (Signed)
Summary: had pacmaker reset now having pains, question about Lisinopril  Phone Note Call from Patient Call back at Home Phone [phone]   Caller: Patient Summary of Call: Pt calling regarding her Lisinopril,was in on 12/17/09 and had pacmaker reset and have been having pain since. Initial call taken by: Judie Grieve,  December 20, 2009 10:57 AM  Follow-up for Phone Call        PER PT UNABLE TO  CUT LISINOPRIL TAB IN HALF INFORMED PT WILL SEND IN NEW SCRIPT VIA EMR . ALSO C/O CHEST PAIN 2 EPISODES ON  TUES AND FEW ON AUGUST 24 AND 1 EPISODE THIS AM PT CONTRIBUTING IT TO REPROGRAMMING OF DEVICE.PLEASE ADVISE.  Follow-up by: Scherrie Bateman, LPN,  December 20, 2009 12:51 PM  Additional Follow-up for Phone Call Additional follow up Details #1::        PER DR Graciela Husbands PAIN NOT RELATED TO DEVICE INSTRUCTED PT TO CONT TO MONITOR IF NO IMPROVEMENT TO CALL BACK  IF S/S INCREASE  GO TO ER. VERBALIZED UNDERSTANDING. Additional Follow-up by: Scherrie Bateman, LPN,  December 20, 2009 6:02 PM    New/Updated Medications: LISINOPRIL 10 MG TABS (LISINOPRIL) 1 once daily Prescriptions: LISINOPRIL 10 MG TABS (LISINOPRIL) 1 once daily  #30 x 11   Entered by:   Scherrie Bateman, LPN   Authorized by:   Nathen May, MD, [facility]   Signed by:   Scherrie Bateman, LPN on 40/01/2724   Method used:   Electronically to        [facility]  [phone]* (retail)       [address]       [place], [place]  [address]       Ph: [phone] or [phone]       Fax: [phone]   RxID:   [id number]

## 2010-05-28 NOTE — Cardiovascular Report (Signed)
Summary: [place]  [place]   Imported By: Sherian Rein 11/01/2009 08:42:26  _____________________________________________________________________  External Attachment:    Type:   Image     Comment:   External Document

## 2010-05-28 NOTE — Miscellaneous (Signed)
  Clinical Lists Changes  Observations: Added new observation of CARDCATHFIND: Normal left and right heart pressures.  Normal right saturations with no evidence of left-to-right shunt.  There was no equalization of pressures in the left and right chambers.   PLAN:  Based on the above, I see no clear cardiac etiology for her dyspnea.  A recent echocardiogram demonstrated that her ejection fraction is much improved.  At this point, we could consider cardiopulmonary stress test.         Rollene Rotunda, MD, [facility]     (08/10/2009 14:59) Added new observation of ECHOINTERP:  - Left ventricle: The cavity size was normal. Wall thickness was       normal. Systolic function was normal. The estimated ejection       fraction was in the range of 55% to 60%. Doppler parameters are       consistent with abnormal left ventricular relaxation (grade 1       diastolic dysfunction).     - Aortic valve: Mild regurgitation.     - Mitral valve: Mild regurgitation.     - Left atrium: The atrium was mildly dilated.        (08/06/2009 15:00)      Cardiac Cath  Procedure date:  08/10/2009  Findings:      Normal left and right heart pressures.  Normal right saturations with no evidence of left-to-right shunt.  There was no equalization of pressures in the left and right chambers.   PLAN:  Based on the above, I see no clear cardiac etiology for her dyspnea.  A recent echocardiogram demonstrated that her ejection fraction is much improved.  At this point, we could consider cardiopulmonary stress test.         Rollene Rotunda, MD, [facility]      Echocardiogram  Procedure date:  08/06/2009  Findings:       - Left ventricle: The cavity size was normal. Wall thickness was       normal. Systolic function was normal. The estimated ejection       fraction was in the range of 55% to 60%. Doppler parameters are       consistent with abnormal left ventricular relaxation (grade 1       diastolic  dysfunction).     - Aortic valve: Mild regurgitation.     - Mitral valve: Mild regurgitation.     - Left atrium: The atrium was mildly dilated.

## 2010-05-28 NOTE — Assessment & Plan Note (Signed)
Summary: DISCUSS OPTIONS//LMR   Visit Type:  Follow-up  CC:  pt states she is here to discuss her options for a mask to help with breathing. pt states her nerve conducting test is scheduled for friday..  Current Medications (verified): 1)  Adult Aspirin Ec Low Strength 81 Mg  Tbec (Aspirin) .... Once Daily 2)  Gabapentin 300 Mg Caps (Gabapentin) .... Take One Tab Two Times A Day 3)  Lisinopril 10 Mg Tabs (Lisinopril) .Marland Kitchen.. 1 Once Daily 4)  Furosemide 40 Mg Tabs (Furosemide) .... Take One Twice Daily 5)  Valium 5 Mg Tabs (Diazepam) .... Take 1/2 Tab By Mouth Two Times A Day 6)  Hydrocodone-Acetaminophen 10-500 Mg Tabs (Hydrocodone-Acetaminophen) .... Take 1 Tab By Mouth At Bedtime and As Needed 7)  Multivitamins  Tabs (Multiple Vitamin) .... Once Daily 8)  Prevacid 15 Mg Cpdr (Lansoprazole) .Marland Kitchen.. 1 Tab Once Daily 9)  Osteo Bi-Flex Triple Strength  Tabs (Misc Natural Products) .... 2 Tab Once Daily 10)  Vitamin D (Ergocalciferol) 50000 Unit Caps (Ergocalciferol) .Marland Kitchen.. 1 Tab Weekly 11)  Vitamin C 500 Mg Tabs (Ascorbic Acid) .... 2 Tabs Once Daily 12)  Calcium Citrate 950 Mg Tabs (Calcium Citrate) .Marland Kitchen.. 1 Tab Once Daily 13)  Pravastatin Sodium 40 Mg Tabs (Pravastatin Sodium) .... One By Mouth Daily 14)  Dulcolax 5 Mg Tbec (Bisacodyl) .Marland Kitchen.. 1 By Mouth Daily As Needed 15)  Vegetable Laxative Otc .... Take 1 Tablet By Mouth Once A Day 16)  Carvedilol 3.125 Mg Tabs (Carvedilol) .... One Two Times A Day 17)  Flector 1.3 % Ptch (Diclofenac Epolamine) .... As Needed For Back Pain  Allergies: 1)  ! Neosporin 2)  ! Reglan 3)  ! * Bacitracin Ointment 4)  ! Relafen 5)  ! * Artho Complex 6)  ! * Non Steriodal Anti Inflammatories 7)  ! * Nitroglycerin  Vital Signs:  Patient profile:   77 year old female Height:      63 inches Weight:      175.13 pounds BMI:     31.14 O2 Sat:      96 % on Room air Temp:     98.1 degrees F oral Pulse rate:   63 / minute BP sitting:   136 / 78  (left arm) Cuff  size:   regular  Vitals Entered By: Carver Fila (January 16, 2010 2:59 PM)  O2 Flow:  Room air CC: pt states she is here to discuss her options for a mask to help with breathing. pt states her nerve conducting test is scheduled for friday. Comments meds and allergies updated Phone number updated Carver Fila  January 16, 2010 3:05 PM     Other Orders: DME Referral (DME) Est. Patient Level III [id number])  Patient Instructions: 1)  will start on bipap as a trial to see if your breathing will improve during the day 2)  please call if tolerance issues with the device 3)  followup with me in 4weeks.   Orders Added: 1)  DME Referral [DME] 2)  Est. Patient Level III [95284]    Immunization History:  Influenza Immunization History:    Influenza:  historical (01/26/2009)   Appended Document: rov to discuss bilevel treatment.     Copy to:  Dr. Antoine Poche Primary Provider/Referring Provider:  Dr. [name]   History of Present Illness: the pt comes in today for discussion of possible treatment options for her dyspnea.  I believe this is due to some type of neuromuscular disease or issue  that is affecting diaphragm function and muscles of respirations.  She has cervical spine issues, and has documented problem at c5 and c6.  She has a known CM, but no significant lung disease noted.  Her history and description of her sob is c/w some type of neuromuscular issue.  Allergies: 1)  ! Neosporin 2)  ! Reglan 3)  ! * Bacitracin Ointment 4)  ! Relafen 5)  ! * Artho Complex 6)  ! * Non Steriodal Anti Inflammatories 7)  ! * Nitroglycerin  Physical Exam  General:  wd female in nad   Impression & Recommendations:  Problem # 1:  SHORTNESS OF BREATH (ICD-786.05) I am very suspicious the pt has a neuromuscular issue leading to some of her dyspnea that can occur at rest or with exertion.  Pulmonary w/u has been unrevealing.  She did have hypercarbia in april of this year, with a  pCO2 of 50 at one time.  She is currently having a w/u with [facility] as well.  I have recommended a trial of bilevel in order to rest her muscles of respiration while sleeping.  This will often translate into improved respiratory muscle strength during the day, and less symptomatic dyspnea.  The pt is willing to give this a try.  Other than this, I really do not have anything else to offer from a pulmonary standpoint.  Time spent discussing the above with the pt was .  Patient Instructions: 1)  please see first part of addendum

## 2010-05-28 NOTE — Letter (Signed)
Summary: [facility] Physician  [facility] Physician   Imported By: Roderic Ovens 08/16/2009 15:24:13  _____________________________________________________________________  External Attachment:    Type:   Image     Comment:   External Document

## 2010-05-28 NOTE — Assessment & Plan Note (Signed)
Summary: PER PAULA/CP/SAF   Visit Type:  Follow-up Primary Provider:  Dr. [name]  CC:  chest pain.  History of Present Illness: The patient was added to my schedule to discuss an episode of chest discomfort. This happened about 5 days ago. This was at rest. Across her upper chest. It was a burning discomfort. It lasted for about. There was no radiation to her neck or to her arms. There was no associated nausea vomiting or diaphoresis. She did not have palpitations, presyncope or syncope. She had not had this before. She did become weak with it. It went away spontaneously. She has had no further symptoms. She mentioned it to our electrophysiology nurses at her recent followup and was referred to come here. She continues to be limited by tiredness and significant dyspnea. It is suggested that she might have a neurologic disorder contributing to some difficulty with her breathing. She has otherwise had extensive workup.  Current Medications (verified): 1)  Adult Aspirin Ec Low Strength 81 Mg  Tbec (Aspirin) .... Once Daily 2)  Gabapentin 300 Mg Caps (Gabapentin) .... Take One Tab Two Times A Day 3)  Lisinopril 10 Mg Tabs (Lisinopril) .Marland Kitchen.. 1 Once Daily 4)  Furosemide 40 Mg Tabs (Furosemide) .... Take One Twice Daily 5)  Valium 5 Mg Tabs (Diazepam) .... Take 1/2 Tab By Mouth Two Times A Day 6)  Hydrocodone-Acetaminophen 10-500 Mg Tabs (Hydrocodone-Acetaminophen) .... Take 1 Tab By Mouth At Bedtime and As Needed 7)  Multivitamins  Tabs (Multiple Vitamin) .... Once Daily 8)  Prevacid 15 Mg Cpdr (Lansoprazole) .Marland Kitchen.. 1 Tab Once Daily 9)  Osteo Bi-Flex Triple Strength  Tabs (Misc Natural Products) .... 2 Tab Once Daily 10)  Vitamin D (Ergocalciferol) 50000 Unit Caps (Ergocalciferol) .Marland Kitchen.. 1 Tab Weekly 11)  Vitamin C 500 Mg Tabs (Ascorbic Acid) .... 2 Tabs Once Daily 12)  Calcium Citrate 950 Mg Tabs (Calcium Citrate) .Marland Kitchen.. 1 Tab Once Daily 13)  Pravastatin Sodium 40 Mg Tabs (Pravastatin Sodium) ....  One By Mouth Daily 14)  Dulcolax 5 Mg Tbec (Bisacodyl) .Marland Kitchen.. 1 By Mouth Daily As Needed 15)  Vegetable Laxative Otc .... Take 1 Tablet By Mouth Once A Day As Needed 16)  Carvedilol 3.125 Mg Tabs (Carvedilol) .... One Two Times A Day 17)  Flector 1.3 % Ptch (Diclofenac Epolamine) .... As Needed For Back Pain  Allergies (verified): 1)  ! Neosporin 2)  ! Reglan 3)  ! * Bacitracin Ointment 4)  ! Relafen 5)  ! * Artho Complex 6)  ! * Non Steriodal Anti Inflammatories 7)  ! * Nitroglycerin  Past History:  Past Medical History: Reviewed history from 10/31/2009 and no changes required. LBBB Nonischemic cardiomyopathy (EF 25-30% echo 2010) Hypertension Diverticulitis  Past Surgical History: Reviewed history from 07/06/2009 and no changes required. CRT-D-[employer] Knee replacement Hysterectomy Appendectomy Neck surgery associated with right arm weakness  Review of Systems       As stated in the HPI and negative for all other systems.   Vital Signs:  Patient profile:   77 year old female Height:      63 inches Weight:      172 pounds BMI:     30.58 Pulse rate:   67 / minute Resp:     16 per minute BP sitting:   140 / 68  (right arm)  Vitals Entered By: Marrion Coy, CNA (March 14, 2010 11:10 AM)  Physical Exam  General:  Well developed, well nourished, in no acute  distress. Head:  normocephalic and atraumatic Mouth:  Teeth, gums and palate normal. Oral mucosa normal. Neck:  Neck supple, no JVD. No masses, thyromegaly or abnormal cervical nodes. Chest Wall:  no deformities or breast masses noted Lungs:  Clear bilaterally to auscultation and percussion. Heart:  Non-displaced PMI, chest non-tender; regular rate and rhythm, S1, S2 without murmurs, rubs or gallops. Carotid upstroke normal, no bruit. Normal abdominal aortic size, no bruits. Femorals normal pulses, no bruits. Pedals normal pulses. No edema, no varicosities. Abdomen:  Bowel sounds positive; abdomen soft and  non-tender without masses, organomegaly, or hernias noted. No hepatosplenomegaly. Msk:  Back normal, normal gait. Muscle strength and tone normal. Extremities:  No clubbing or cyanosis. Neurologic:  Alert and oriented x 3. Skin:  Intact without lesions or rashes. Cervical Nodes:  no significant adenopathy Axillary Nodes:  no significant adenopathy Inguinal Nodes:  no significant adenopathy Psych:  Normal affect.   EKG  Procedure date:  03/14/2010  Findings:      Sinus rhythm, left bundle branch block   ICD Specifications Following MD:  Sherryl Manges, MD     ICD Vendor:  [facility] [employer]     ICD Model Number:  ZO1096-04     ICD Serial Number:  [id number] ICD DOI:  11/29/2008     ICD Implanting MD:  Sherryl Manges, MD  Lead 1:    Location: RA     DOI: 11/29/2008     Model #: [id number]     Serial #: [id number]     Status: active Lead 2:    Location: RV     DOI: 11/29/2008     Model #: [id number]     Serial #: [id number]     Status: active Lead 3:    Location: LV     DOI: 11/29/2008     Model #: [id number]     Serial #: [id number]     Status: active  ICD Follow Up ICD Dependent:  No       ICD Device Measurements Configuration: BIPOLAR  Episodes Coumadin:  No  Brady Parameters Mode DDD     Lower Rate Limit:  60     Upper Rate Limit 120 PAV 140     Sensed AV Delay:  110  Tachy Zones VF:  240     VT:  214     VT1:  171     Impression & Recommendations:  Problem # 1:  CHEST PAIN (ICD-786.50) Her chest pain very atypical. There has been no objective evidence of ischemia. She has had normal coronaries and I reviewed this From 2007. Stress testing this year with cardiopulmonary testing did not suggest ischemia. At this point I would not suggest further cardiovascular testing unless her symptoms recur.  Problem # 2:  SYSTOLIC HEART FAILURE, CHRONIC (ICD-428.22) She continues with medical management. No change in therapy is indicated.  Problem # 3:  HYPOTENSION (ICD-458.9) Despite the fact that her blood pressure  is elevated now she has been hypotensive previously. Therefore, I am hesitant to titrate meds unless she demonstrates a clear trend of normal or higher blood pressures.  Problem # 4:  COUGH (ICD-786.2) She is describing a nonproductive dry cough. I am going to switch her to Cozaar 25 mg daily and discontinue her lisinopril.  Other Orders: EKG w/ Interpretation (93000)  Patient Instructions: 1)  Your physician recommends that you schedule a follow-up appointment as needed 2)  Your physician has recommended you make the following change in your medication:  Stop Lisinopril and start Losartan  25 mg once a day. Prescriptions: LOSARTAN POTASSIUM 25 MG TABS (LOSARTAN POTASSIUM) one daily by mouth  #90 x 3   Entered by:   Charolotte Capuchin, RN   Authorized by:   Rollene Rotunda, MD, [facility]   Signed by:   Charolotte Capuchin, RN on 03/14/2010   Method used:   Electronically to        [facility]  [phone]* (retail)       [address]       [place], [place]  [address]       Ph: [phone] or [phone]       Fax: [phone]   RxID:   [id number]  I have reviewed and approved all prescriptions at the time of this visit. Rollene Rotunda, MD, [facility]  March 14, 2010 12:19 PM

## 2010-05-28 NOTE — Assessment & Plan Note (Signed)
Summary: OLD PATIENT OF DR.SMITH/[name]   Visit Type:  Follow-up Primary Provider:  Dr.Rob  Ehinger  CC:  Dyspnea and Cardiomyopathy.  History of Present Illness: The patient presents for evaluation of dyspnea. She reports a long history of cardiomyopathy dating back years to when she lived in [place]. She has had 2 catheterizations with the most recent being in 2004. I reviewed these records at which point she had a right heart catheterization as well with only mildly elevated right-sided pressures. Coronary arteries were essentially normal. Unfortunately she's had a slowly progressive course of dyspnea to the point where she is now short of breath taking a shower or doing even minimal activities. She does not describe PND although she has chronically slept on 3 pillows. She was followed by another cardiology group it is seeking another assessment. She has been seen by Dr. Graciela Husbands in our office.  He inserted a biventricular ICD in October when it was found that her ejection fraction had progressively declined with an EF now 20-30%. Initially after resynchronization therapy she said she had significant improvement. However, this lasted for only a few weeks. She's had adjustments to her pacemaker timing with optimization but no significant improvement in symptoms. Other evaluations have included referral to Dr. Shelle Iron for pulmonary workup. No therapies have thus far been successful in improving her dyspnea and no clear etiology is defined. She was seen by an ENT but reports a diagnosis with this either. I did review the last echocardiogram demonstrating moderate left ventricular dilatation with an EF of 25-30% but no significant valvular abnormalities. She's had chronic left bundle-branch block. I do note that recent BNP is having well below 100. She did not improve with an increased dose of diuretic recently. She denies chest pressure, neck or arm discomfort. She doesn't report palpitations, presyncope or  syncope. She's had some mild chronic lower extremity swelling.  Current Medications (verified): 1)  Mobic 15 Mg  Tabs (Meloxicam) .Marland Kitchen.. 1 Tab Once Daily 2)  Adult Aspirin Ec Low Strength 81 Mg  Tbec (Aspirin) .... Once Daily 3)  Gabapentin 300 Mg Caps (Gabapentin) .... Take One Tab Two Times A Day 4)  Lisinopril 20 Mg Tabs (Lisinopril) .... Once Daily 5)  Furosemide 40 Mg Tabs (Furosemide) .... Take One Twice Daily 6)  Valium 2 Mg Tabs (Diazepam) .... One By Mouth Two Times A Day 7)  Hydrocodone-Acetaminophen 2.5-500 Mg Tabs (Hydrocodone-Acetaminophen) .... As Needed 8)  Multivitamins  Tabs (Multiple Vitamin) .... Once Daily 9)  Prevacid 15 Mg Cpdr (Lansoprazole) .Marland Kitchen.. 1 Tab Once Daily 10)  Osteo Bi-Flex Triple Strength  Tabs (Misc Natural Products) .Marland Kitchen.. 1 Tab Once Daily 11)  Vitamin D (Ergocalciferol) 50000 Unit Caps (Ergocalciferol) .Marland Kitchen.. 1 Tab Weekly 12)  Vitamin C 500 Mg Tabs (Ascorbic Acid) .... 2 Tabs Once Daily 13)  Calcium Citrate 950 Mg Tabs (Calcium Citrate) .Marland Kitchen.. 1 Tab Once Daily 14)  Pravastatin Sodium 40 Mg Tabs (Pravastatin Sodium) .... One By Mouth Daily 15)  Dulcolax 5 Mg Tbec (Bisacodyl) .Marland Kitchen.. 1 By Mouth Dailyu 16)  Stool Softener 100 Mg Caps (Docusate Sodium) .Marland Kitchen.. 1 Po Daily 17)  Carvedilol 3.125 Mg Tabs (Carvedilol) .... One Two Times A Day  Allergies (verified): 1)  ! Neosporin 2)  ! Reglan 3)  ! * Bacitracin Ointment 4)  ! Relafen 5)  ! * Artho Complex  Past History:  Past Medical History: LBBB Nonischemic cardiomyopathy (EF 25-30% echo 2010) Hypertension Asthma Diverticulitis  Past Surgical History: CRT-D-[employer] Knee replacement Hysterectomy  Appendectomy Neck surgery associated with right arm weakness  Family History: Reviewed history from 10/10/2008 and no changes required. Negative FH of Diabetes, Hypertension, or Coronary Artery Disease son died suddenly autopsy apparently showed coronary disease but in the son's father's family is a broad  history of premature sudden cardiac death that his mom and unexplained  Social History: Reviewed history from 10/10/2008 and no changes required. Widowed ; 3 children 2 surviving Tobacco Use - Never  Alcohol Use - no Regular Exercise - no  Vital Signs:  Patient profile:   77 year old female Height:      63 inches Weight:      170 pounds BMI:     30.22 Pulse rate:   69 / minute Resp:     16 per minute BP sitting:   118 / 74  (right arm)  Vitals Entered By: Marrion Coy, CNA (July 06, 2009 12:01 PM)  Physical Exam  General:  Well developed, well nourished, in no acute distress. Head:  normocephalic and atraumatic Eyes:  PERRLA/EOM intact; conjunctiva and lids normal. Mouth:  Teeth, gums and palate normal. Oral mucosa normal. Neck:  Neck supple, no JVD. No masses, thyromegaly or abnormal cervical nodes. Chest Wall:  no deformities or breast masses noted Lungs:  Clear bilaterally to auscultation and percussion. Abdomen:  Bowel sounds positive; abdomen soft and non-tender without masses, organomegaly, or hernias noted. No hepatosplenomegaly. Msk:  Back normal, normal gait. Muscle strength and tone normal. Extremities:  No clubbing or cyanosis. Neurologic:  Alert and oriented x 3. Skin:  Intact without lesions or rashes. Cervical Nodes:  no significant adenopathy Axillary Nodes:  no significant adenopathy Inguinal Nodes:  no significant adenopathy Psych:  Normal affect.   Detailed Cardiovascular Exam  Neck    Carotids: Carotids full and equal bilaterally without bruits.      Neck Veins: Normal, no JVD.    Heart    Inspection: no deformities or lifts noted.      Palpation: normal PMI with no thrills palpable.      Auscultation: regular rate and rhythm, S1, S2 without murmurs, rubs, gallops, or clicks.    Vascular    Abdominal Aorta: no palpable masses, pulsations, or audible bruits.      Femoral Pulses: normal femoral pulses bilaterally.      Pedal Pulses: normal  pedal pulses bilaterally.      Radial Pulses: normal radial pulses bilaterally.      Peripheral Circulation: no clubbing, cyanosis, or edema noted with normal capillary refill.     EKG  Procedure date:  07/06/2009  Findings:      sinus rhythm rate 69, ventricular pacing 100% capture   ICD Specifications Following MD:  Sherryl Manges, MD     ICD Vendor:  [employer]     ICD Model Number:  ZO1096-04     ICD Serial Number:  [id number] ICD DOI:  11/29/2008     ICD Implanting MD:  Sherryl Manges, MD  Lead 1:    Location: RA     DOI: 11/29/2008     Model #: [id number]     Serial #: [id number]     Status: active Lead 2:    Location: RV     DOI: 11/29/2008     Model #: [id number]     Serial #: [id number]     Status: active Lead 3:    Location: LV     DOI: 11/29/2008     Model #: [id number]  Serial #: [id number]     Status: active  ICD Follow Up ICD Dependent:  No       ICD Device Measurements Configuration: BIPOLAR  Episodes Coumadin:  No  Brady Parameters Mode DDD     Lower Rate Limit:  60     Upper Rate Limit 120 PAV 140     Sensed AV Delay:  110  Tachy Zones VF:  240     VT:  214     VT1:  171     Impression & Recommendations:  Problem # 1:  SHORTNESS OF BREATH (ICD-786.05) This complaint seems out of proportion to the degree of cardiomyopathy and certainly out of proportion to her BNP. I'll make a simple changed her meds to begin Coreg instead of Toprol with plans for up titration. I don't suspect dramatic improvement with this. Other testing that might be helpful his cardiopulmonary stress testing or repeat right heart catheterization with a shunt run. I see her back in a couple of weeks to titrate her meds and further decide on repeat studies. Orders: EKG w/ Interpretation (93000)  Problem # 2:  DYSLIPIDEMIA (ICD-272.4) This will be followed by her primary M.D.  Problem # 3:  AUTOMATIC  CARDIAC CRT  STJ (ICD-V45.02) She is followed in our EP clinic.  Patient Instructions: 1)  Your physician  recommends that you schedule a follow-up appointment in: 2 weeks with Dr Antoine Poche 2)  Your physician has recommended you make the following change in your medication: Stop Metoprolol and start Carvedilol 3.125 mg twice a day 3)  Your physician has requested that you limit the intake of sodium (salt) in your diet to two grams daily. Please see [facility] handout. 4)  You have been diagnosed with Congestive Heart Failure or CHF.  CHF is a condition in which a problem with the structure or function of the heart impairs its ability to supply sufficient blood flow to meet the body's needs.  For further information please visit www.cardiosmart.org for detailed information on CHF. 5)  Your physician recommends that you weigh, daily, at the same time every day, and in the same amount of clothing.  Please record your daily weights on the handout provided and bring it to your next appointment. Prescriptions: FUROSEMIDE 40 MG TABS (FUROSEMIDE) take one twice daily  #60 x 11   Entered by:   Charolotte Capuchin, RN   Authorized by:   Rollene Rotunda, MD, [facility]   Signed by:   Charolotte Capuchin, RN on 07/06/2009   Method used:   Electronically to        [facility]  [phone]* (retail)       [address]       [place], [place]  [address]       Ph: [phone] or [phone]       Fax: [phone]   RxID:   [id number] LISINOPRIL 20 MG TABS (LISINOPRIL) once daily  #30 x 11   Entered by:   Charolotte Capuchin, RN   Authorized by:   Rollene Rotunda, MD, [facility]   Signed by:   Charolotte Capuchin, RN on 07/06/2009   Method used:   Electronically to        [facility]  [phone]* (retail)       [address]       [place], [place]  [address]       Ph:  [phone] or [phone]       Fax: [phone]   RxID:   [id number] CARVEDILOL 3.125 MG TABS (CARVEDILOL) one two times a day  #60 x 6   Entered by:   Charolotte Capuchin, RN    Authorized by:   Rollene Rotunda, MD, [facility]   Signed by:   Charolotte Capuchin, RN on 07/06/2009   Method used:   Electronically to        [facility]  [phone]* (retail)       [address]       [place], [place]  [address]       Ph: [phone] or [phone]       Fax: [phone]   RxID:   [id number]

## 2010-05-28 NOTE — Progress Notes (Signed)
Summary: increased sob with bipap    Phone Note Other Incoming Call back at [phone]   Caller: Theodoro Grist from [facility] Summary of Call: Theodoro Grist called wanting to speak to Dr. Shelle Iron or his nurse regarding pt.  Only info he would give me is that pt contacted him recently stating she is having more increased sob since starting on Bipap.    I ATC pt at home # to get more info regarding this.  [email or web address].  Aundra Millet Reynolds LPN  January 22, 2010 10:46 AM  Initial call taken by: Arman Filter LPN,  January 22, 2010 10:46 AM  Follow-up for Phone Call        See other phone note dated 01/22/10 Follow-up by: Vernie Murders,  January 22, 2010 12:57 PM

## 2010-05-28 NOTE — Procedures (Signed)
Summary: device/saf   Current Medications (verified): 1)  Mobic 15 Mg  Tabs (Meloxicam) .Marland Kitchen.. 1 Tab Once Daily 2)  Metoprolol Succinate 25 Mg Xr24h-Tab (Metoprolol Succinate) .... Once Daily 3)  Adult Aspirin Ec Low Strength 81 Mg  Tbec (Aspirin) .... Once Daily 4)  Gabapentin 300 Mg Caps (Gabapentin) .... Take One Tab Two Times A Day 5)  Lisinopril 20 Mg Tabs (Lisinopril) .... Once Daily 6)  Furosemide 40 Mg Tabs (Furosemide) .... Take One Twice Daily 7)  Valium 2 Mg Tabs (Diazepam) .... One By Mouth Two Times A Day 8)  Hydrocodone-Acetaminophen 2.5-500 Mg Tabs (Hydrocodone-Acetaminophen) .... As Needed 9)  Multivitamins  Tabs (Multiple Vitamin) .... Once Daily 10)  Prevacid 15 Mg Cpdr (Lansoprazole) .Marland Kitchen.. 1 Tab Once Daily 11)  Osteo Bi-Flex Triple Strength  Tabs (Misc Natural Products) .Marland Kitchen.. 1 Tab Once Daily 12)  Vitamin D (Ergocalciferol) 50000 Unit Caps (Ergocalciferol) .Marland Kitchen.. 1 Tab Weekly 13)  Vitamin C 500 Mg Tabs (Ascorbic Acid) .... 2 Tabs Once Daily 14)  Calcium Citrate 950 Mg Tabs (Calcium Citrate) .Marland Kitchen.. 1 Tab Once Daily 15)  Pravastatin Sodium 40 Mg Tabs (Pravastatin Sodium) .... One By Mouth Daily  Allergies (verified): 1)  ! Neosporin 2)  ! Reglan 3)  ! * Bacitracin Ointment 4)  ! Relafen 5)  ! * Artho Complex    ICD Specifications Following MD:  Sherryl Manges, MD     ICD Vendor:  [facility] Jude     ICD Model Number:  DG6440-34     ICD Serial Number:  [id number] ICD DOI:  11/29/2008     ICD Implanting MD:  Sherryl Manges, MD  Lead 1:    Location: RA     DOI: 11/29/2008     Model #: [id number]     Serial #: [id number]     Status: active Lead 2:    Location: RV     DOI: 11/29/2008     Model #: 7120     Serial #: [id number]     Status: active Lead 3:    Location: LV     DOI: 11/29/2008     Model #: [id number]     Serial #: [id number]     Status: active  ICD Follow Up Remote Check?  No Charge Time:  8.4 seconds     Battery Est. Longevity:  7.6 years Underlying rhythm:  SR ICD Dependent:  No        ICD Device Measurements Atrium:  Amplitude: 1.7 mV, Impedance: 630 ohms, Threshold: 0.5 V at 0.4 msec Right Ventricle:  Amplitude: 12 mV, Impedance: 640 ohms, Threshold: 0.5 V at 0.4 msec Left Ventricle:  Impedance: 1000 ohms, Threshold: 0.875 V at 0.4 msec Configuration: BIPOLAR Shock Impedance: 53 ohms   Episodes MS Episodes:  1     Percent Mode Switch:  <1%     Coumadin:  No Shock:  0     ATP:  0     Nonsustained:  0     Atrial Pacing:  55%     Ventricular Pacing:  100%  Brady Parameters Mode DDD     Lower Rate Limit:  60     Upper Rate Limit 120 PAV 140     Sensed AV Delay:  110  Tachy Zones VF:  240     VT:  214     VT1:  171     Next Cardiology Appt Due:  08/26/2009 Tech Comments:  The patient continues to c/o SOB with minimal  exertion and fatigue.  On her last visit we reprogrammed her AV delays and turned off her rate response.  Her QRS today was wider with LV-RV so we did not reprogram her.  She also was c/o her b/p dipping below 90 systolic, she checks it twice daily.  Her primary Cardiology is Dr. Katrinka Blazing but today she is requesting to being seen by someone here permanently.  We have scheduled her to establish with Dr. Antoine Poche and a 3 month visit with Dr. Graciela Husbands for her device check. Altha Harm, LPN  June 13, 2009 3:27 PM

## 2010-05-28 NOTE — Cardiovascular Report (Signed)
Summary: Office Visit   Office Visit   Imported By: Roderic Ovens 12/17/2009 14:23:22  _____________________________________________________________________  External Attachment:    Type:   Image     Comment:   External Document

## 2010-05-28 NOTE — Progress Notes (Signed)
Summary: pt would like results   CPX  Phone Note Call from Patient Call back at Home Phone [phone]   Caller: Patient Reason for Call: Talk to Nurse, Talk to Doctor, Lab or Test Results Summary of Call: pt had a test done on Monday 6/6 and she wants to know the results of that test Initial call taken by: Omer Jack,  October 05, 2009 11:23 AM  Follow-up for Phone Call        results not available yet.  Pt has an appointment Wed with Dr [place]acedale Lions Follow-up by: Charolotte Capuchin, RN,  October 05, 2009 2:52 PM

## 2010-05-28 NOTE — Cardiovascular Report (Signed)
Summary: Pre Cath Orders  Pre Cath Orders   Imported By: Roderic Ovens 08/15/2009 09:18:09  _____________________________________________________________________  External Attachment:    Type:   Image     Comment:   External Document  Appended Document: Pre Cath Orders Preliminarily reviewed. Forwarded to MD desktop for review and signature

## 2010-05-28 NOTE — Progress Notes (Signed)
  Phone Note Call from Patient   Caller: [email or web address]  Call For: clance Summary of Call: ist night on bipap she did well but has headache and more sob this am,  dave's suggestion that we lower the pressure to 8/4 Initial call taken by: Oneita Jolly,  January 22, 2010 10:52 AM  Follow-up for Phone Call        lets try 10/6, but make sure she isn't pulling mask too tight and causing headaches. Follow-up by: Barbaraann Share MD,  January 22, 2010 12:41 PM  Additional Follow-up for Phone Call Additional follow up Details #1::        Spoke with Theodoro Grist and notified of the above.  Order sent to [facility]. Additional Follow-up by: Vernie Murders,  January 22, 2010 12:57 PM

## 2010-05-28 NOTE — Assessment & Plan Note (Signed)
Summary: rov for dyspnea   Visit Type:  Follow-up Copy to:  Dr. Annitta Jersey Neurologic Primary Provider/Referring Provider:  Dr. [name]  CC:  4 week follow up. pt states she still gets real sob with any activity.  Pt states while being on bipap she sleeps better at night but she can't tell a difference in the breathing during the day. pt states the mask has been leaving marks on her face. pt states she has tried many masks and they all do the same.  pt is never smoker.  History of Present Illness: the pt comes in today for f/u of her dyspnea.  She has had an extensive pulmonary w/u with no intrathoracic cause for her sob.  The working hypothesis is that she may have some type of neuromuscular disease that may be contributing to this.  She has had cervical spine interventions, and is known to have some type of c5/c6 issue, but unknown if involves high cervical spine.  I have felt that she has some type of respiratory muscle weakness/fatigue, and she has had documented hypercarbia at one time.  She has been tried on bipap to help with sleep and also to see if her breathing would be better during the day.  She comes in where she feels that she is definitely sleeping better, but has not seen an improvement in her sob with exertion during the day.  She has no issue with her current pressure level, but is having issues with mask fit.  She has not been able to tolerate anything but a full face mask.  She is due to see a new neurologist (her last just left the practice), and he will review her last round of testing for NM disease.    Current Medications (verified): 1)  Adult Aspirin Ec Low Strength 81 Mg  Tbec (Aspirin) .... Once Daily 2)  Gabapentin 300 Mg Caps (Gabapentin) .... Take One Tab Two Times A Day 3)  Lisinopril 10 Mg Tabs (Lisinopril) .Marland Kitchen.. 1 Once Daily 4)  Furosemide 40 Mg Tabs (Furosemide) .... Take One Twice Daily 5)  Valium 5 Mg Tabs (Diazepam) .... Take 1/2 Tab By Mouth Two  Times A Day 6)  Hydrocodone-Acetaminophen 10-500 Mg Tabs (Hydrocodone-Acetaminophen) .... Take 1 Tab By Mouth At Bedtime and As Needed 7)  Multivitamins  Tabs (Multiple Vitamin) .... Once Daily 8)  Prevacid 15 Mg Cpdr (Lansoprazole) .Marland Kitchen.. 1 Tab Once Daily 9)  Osteo Bi-Flex Triple Strength  Tabs (Misc Natural Products) .... 2 Tab Once Daily 10)  Vitamin D (Ergocalciferol) 50000 Unit Caps (Ergocalciferol) .Marland Kitchen.. 1 Tab Weekly 11)  Vitamin C 500 Mg Tabs (Ascorbic Acid) .... 2 Tabs Once Daily 12)  Calcium Citrate 950 Mg Tabs (Calcium Citrate) .Marland Kitchen.. 1 Tab Once Daily 13)  Pravastatin Sodium 40 Mg Tabs (Pravastatin Sodium) .... One By Mouth Daily 14)  Dulcolax 5 Mg Tbec (Bisacodyl) .Marland Kitchen.. 1 By Mouth Daily As Needed 15)  Vegetable Laxative Otc .... Take 1 Tablet By Mouth Once A Day As Needed 16)  Carvedilol 3.125 Mg Tabs (Carvedilol) .... One Two Times A Day 17)  Flector 1.3 % Ptch (Diclofenac Epolamine) .... As Needed For Back Pain  Allergies (verified): 1)  ! Neosporin 2)  ! Reglan 3)  ! * Bacitracin Ointment 4)  ! Relafen 5)  ! * Artho Complex 6)  ! * Non Steriodal Anti Inflammatories 7)  ! * Nitroglycerin  Review of Systems       The patient complains of shortness  of breath with activity, acid heartburn, hand/feet swelling, and joint stiffness or pain.  The patient denies shortness of breath at rest, productive cough, non-productive cough, coughing up blood, chest pain, irregular heartbeats, indigestion, loss of appetite, weight change, abdominal pain, difficulty swallowing, sore throat, tooth/dental problems, headaches, nasal congestion/difficulty breathing through nose, sneezing, itching, ear ache, anxiety, depression, rash, change in color of mucus, and fever.    Vital Signs:  Patient profile:   77 year old female Height:      63 inches Weight:      174.50 pounds BMI:     31.02 O2 Sat:      98 % on Room air Temp:     97.7 degrees F oral Pulse rate:   65 / minute BP sitting:   130 / 70   (left arm) Cuff size:   regular  Vitals Entered By: Carver Fila (February 13, 2010 1:43 PM)  O2 Flow:  Room air CC: 4 week follow up. pt states she still gets real sob with any activity.  Pt states while being on bipap she sleeps better at night but she can't tell a difference in the breathing during the day. pt states the mask has been leaving marks on her face. pt states she has tried many masks and they all do the same.  pt is never smoker Comments meds and allergies updated Phone number updated Carver Fila  February 13, 2010 1:43 PM    Physical Exam  General:  obese female in nad Nose:  mild excoriation over bridge of nose, but no skin breakdown from mask Lungs:  minimal crackles in bases Heart:  rrr Extremities:  mild edema but no cyanosis  Neurologic:  alert and oriented, does not appear sleepy. moves all 4.   Impression & Recommendations:  Problem # 1:  SHORTNESS OF BREATH (ICD-786.05) the pt has no parenchymal lung disease or airway disease to explain her sob.  There is some suggestion of NM weakness playing a role here, and we are trying her on bipap to see if it would help.  She is sleeping better, but has not seen an improvement in daytime dyspnea.  She is due to establish with a new neurologist soon, and will continue on bilevel for now to see how she does.  Medications Added to Medication List This Visit: 1)  Vegetable Laxative Otc  .... Take 1 tablet by mouth once a day as needed  Other Orders: Est. Patient Level III (16109) DME Referral (DME)  Patient Instructions: 1)  will see if we can get a cpap mask for you that will fit better. 2)  stay on bipap to help your sleep 3)  keep apptm with new neurologist to review studies to date. 4)  followup with me in 6mos or sooner if problems   Immunization History:  Influenza Immunization History:    Influenza:  historical (02/08/2010)

## 2010-05-28 NOTE — Assessment & Plan Note (Signed)
Summary: 3 MONTH/[name]   Referring Provider:  Verdis Prime Primary Provider:  Trish Fountain  CC:  3 month follow up. Pt SOB today.  Says some days are better.  History of Present Illness: Heather Bullock is seen in followup for a cardiomyopathy with depressed left ventricular function for which she underwent CRT implantation. This is associated with subsequent significant improvement and then  deterioration. she continues to struggle with shortness of breath. Some days are better than others. There there does not seem to be a correlation with swelling of her feet    she is undergone extensive and recurrent evaluation including a catheterization April 14 demonstrating normal left and right heart pressures. She had normal right saturations with no evidence of left to right shunt. There is no equalization of pressures in the left and the right chambers.  There is no clear cardiac etiology for her dyspnea. At this point cardiopulmonary stress testing is pending for next week   She also notes that she has had episodes of hypotension. She has recorded her blood pressure is associated with a weak feeling and has obtained numbers of 70-80 or so. These can persist for a day or 2 before normalization. They do not seem to correlate with her shortness of breath.     Anticoagulation Management History:      Positive risk factors for bleeding include an age of 34 years or older and presence of serious comorbidities.  The bleeding index is 'intermediate risk'.  Positive CHADS2 values include History of CHF and Age > 75 years old.  Her last INR was 1.0 ratio.     Current Medications (verified): 1)  Adult Aspirin Ec Low Strength 81 Mg  Tbec (Aspirin) .... Once Daily 2)  Gabapentin 300 Mg Caps (Gabapentin) .... Take One Tab Two Times A Day 3)  Lisinopril 20 Mg Tabs (Lisinopril) .... Once Daily 4)  Furosemide 40 Mg Tabs (Furosemide) .... Take One Twice Daily 5)  Valium 5 Mg Tabs (Diazepam) .... Take One At  Bedtime 6)  Hydrocodone-Acetaminophen 2.5-500 Mg Tabs (Hydrocodone-Acetaminophen) .... As Needed 7)  Multivitamins  Tabs (Multiple Vitamin) .... Once Daily 8)  Prevacid 15 Mg Cpdr (Lansoprazole) .Marland Kitchen.. 1 Tab Once Daily 9)  Osteo Bi-Flex Triple Strength  Tabs (Misc Natural Products) .Marland Kitchen.. 1 Tab Once Daily 10)  Vitamin D (Ergocalciferol) 50000 Unit Caps (Ergocalciferol) .Marland Kitchen.. 1 Tab Weekly 11)  Vitamin C 500 Mg Tabs (Ascorbic Acid) .... 2 Tabs Once Daily 12)  Calcium Citrate 950 Mg Tabs (Calcium Citrate) .Marland Kitchen.. 1 Tab Once Daily 13)  Pravastatin Sodium 40 Mg Tabs (Pravastatin Sodium) .... One By Mouth Daily 14)  Dulcolax 5 Mg Tbec (Bisacodyl) .Marland Kitchen.. 1 By Mouth Dailyu 15)  Stool Softener 100 Mg Caps (Docusate Sodium) .Marland Kitchen.. 1 Po Daily 16)  Carvedilol 3.125 Mg Tabs (Carvedilol) .... One Two Times A Day 17)  Flector 1.3 % Ptch (Diclofenac Epolamine) .... As Needed For Back Pain  Allergies (verified): 1)  ! Neosporin 2)  ! Reglan 3)  ! * Bacitracin Ointment 4)  ! Relafen 5)  ! * Artho Complex  Past History:  Past Medical History: Last updated: 07/06/2009 LBBB Nonischemic cardiomyopathy (EF 25-30% echo 2010) Hypertension Asthma Diverticulitis  Past Surgical History: Last updated: 07/06/2009 CRT-D-[employer] Jude's Knee replacement Hysterectomy Appendectomy Neck surgery associated with right arm weakness  Family History: Last updated: 10/10/2008 Negative FH of Diabetes, Hypertension, or Coronary Artery Disease son died suddenly autopsy apparently showed coronary disease but in the son's father's family is  a broad history of premature sudden cardiac death that his mom and unexplained  Social History: Last updated: 07/06/2009 Widowed ; 3 children 2 surviving Tobacco Use - Never  Alcohol Use - no Regular Exercise - no  Vital Signs:  Patient profile:   77 year old female Height:      63 inches Weight:      174 pounds BMI:     30.93 Pulse rate:   64 / minute Pulse rhythm:   regular BP  sitting:   108 / 62  (right arm) Cuff size:   regular  Vitals Entered By: Judithe Modest CMA (Sep 25, 2009 11:00 AM)  Physical Exam  General:  The patient was alert and oriented in no acute distress. HEENT Normal.  Neck veins were flat, carotids were brisk.  Lungs were clear.  Heart sounds were regular without murmurs or gallops.  Abdomen was soft with active bowel sounds. There is no clubbing cyanosis; trace edema Skin Warm and dry \\par    ICD Specifications Following MD:  Sherryl Manges, MD     ICD Vendor:  St Jude     ICD Model Number:  NA3557-32     ICD Serial Number:  [id number] ICD DOI:  11/29/2008     ICD Implanting MD:  Sherryl Manges, MD  Lead 1:    Location: RA     DOI: 11/29/2008     Model #: [id number]     Serial #: [id number]     Status: active Lead 2:    Location: RV     DOI: 11/29/2008     Model #: 7120     Serial #: [id number]     Status: active Lead 3:    Location: LV     DOI: 11/29/2008     Model #: 1158T     Serial #: [id number]     Status: active  ICD Follow Up Charge Time:  8.5 seconds     Battery Est. Longevity:  7.4-7.69YRS Underlying rhythm:  SR ICD Dependent:  No       ICD Device Measurements Atrium:  Amplitude: 2.8 mV, Impedance: 550 ohms, Threshold: 0.375 V at 0.4 msec Right Ventricle:  Amplitude: 12.0 mV, Impedance: 540 ohms, Threshold: 0.5 V at 0.4 msec Left Ventricle:  Impedance: 910 ohms, Threshold: 0.875 V at 0.4 msec Configuration: BIPOLAR Shock Impedance: 45 ohms   Episodes MS Episodes:  0     Percent Mode Switch:  0%     Coumadin:  No Shock:  0     ATP:  0     Nonsustained:  0     Atrial Therapies:  0 Atrial Pacing:  51%     Ventricular Pacing:  >99%  Brady Parameters Mode DDD     Lower Rate Limit:  60     Upper Rate Limit 120 PAV 140     Sensed AV Delay:  110  Tachy Zones VF:  240     VT:  214     VT1:  171     Tech Comments:  NORMAL DEVICE FUNCTION.  NO EPISODES SINCE LAST CHECK.  TURNED ACAP CONFIRM ON AND A AMPLITUDE FROM 2.0 TO 1.375 VVella Kohler  Sep 25, 2009 11:19 AM  Impression & Recommendations:  Problem # 1:  SHORTNESS OF BREATH (ICD-786.05) Or shortness of breath continues to be a major issue. She'll undertake krypton or stress testing next week. She is to followup with Dr. Antoine Poche after that. She seen Dr.  Clance in the past. His input may be desirable. Her updated medication list for this problem includes:    Adult Aspirin Ec Low Strength 81 Mg Tbec (Aspirin) ..... Once daily    Lisinopril 20 Mg Tabs (Lisinopril) .Marland Kitchen... Take 1 tablet by mouth at bedtime    Furosemide 40 Mg Tabs (Furosemide) .Marland Kitchen... Take one twice daily    Carvedilol 3.125 Mg Tabs (Carvedilol) ..... One two times a day  Problem # 2:  CARDIOMYOPATHY, PRIMARY, RECOVERED-EF 55% (ICD-425.4) Her cardiomyopathy has largely recovered. At this point given her hypotension I don't plan to decrease her lisinopril from 20-10. It made be worth further down titrating. Her updated medication list for this problem includes:    Adult Aspirin Ec Low Strength 81 Mg Tbec (Aspirin) ..... Once daily    Lisinopril 20 Mg Tabs (Lisinopril) .Marland Kitchen... Take 1 tablet by mouth at bedtime    Furosemide 40 Mg Tabs (Furosemide) .Marland Kitchen... Take one twice daily    Carvedilol 3.125 Mg Tabs (Carvedilol) ..... One two times a day  Problem # 3:  HYPOTENSION (ICD-458.9) her episodic hypotension is quite problematic for her in terms of weakness. I don't understand the mechanism of it. We'll plan to decrease her lisinopril as noted.  Problem # 4:  ICD CRT  STJ (ICD-V45.02) Device parameters and data were reviewed and LV offset was programmed at -40.  Patient Instructions: 1)  Your physician has recommended you make the following change in your medication: Change Lisinopril to taking at bedtime.  2)  Your physician recommends that you schedule a follow-up appointment in: 3 months with Dr Graciela Husbands.

## 2010-05-28 NOTE — Assessment & Plan Note (Signed)
Summary: f19m/icd check also   Referring Provider:  Dr. Antoine Poche Primary Provider:  Dr. [name]  CC:  f60m.  ICD check.  Pt still feeling SOB and has since last visit seen Dr. Shelle Iron and had a CT scan.  Pt does not feel improved.  History of Present Illness: Heather Bullock continues to struggle with shortness of breath for which she seen Dr. Shelle Iron and for which a neuromuscular consultation is pending She underwent a CPX testing which suggested submaximal effort and a possibility was raised as to RV apical pacing. She has undergone extensive and recurrent evaluation including a catheterization April 14 demonstrating normal left and right heart pressures. She had normal right saturations with no evidence of left to right shunt. There is no equalization of pressures in the left and the right chambers.   her symptoms remain quite limiting. They are variable day to day  CT reviewed without clear contributing diagnosis      Current Medications (verified): 1)  Adult Aspirin Ec Low Strength 81 Mg  Tbec (Aspirin) .... Once Daily 2)  Gabapentin 300 Mg Caps (Gabapentin) .... Take One Tab Two Times A Day 3)  Lisinopril 20 Mg Tabs (Lisinopril) .... Take 1 Tablet By Mouth At Bedtime 4)  Furosemide 40 Mg Tabs (Furosemide) .... Take One Twice Daily 5)  Valium 5 Mg Tabs (Diazepam) .... Take 1/2 Tab By Mouth Two Times A Day 6)  Hydrocodone-Acetaminophen 10-500 Mg Tabs (Hydrocodone-Acetaminophen) .... Take 1 Tab By Mouth At Bedtime and As Needed 7)  Multivitamins  Tabs (Multiple Vitamin) .... Once Daily 8)  Prevacid 15 Mg Cpdr (Lansoprazole) .Marland Kitchen.. 1 Tab Once Daily 9)  Osteo Bi-Flex Triple Strength  Tabs (Misc Natural Products) .... 2 Tab Once Daily 10)  Vitamin D (Ergocalciferol) 50000 Unit Caps (Ergocalciferol) .Marland Kitchen.. 1 Tab Weekly 11)  Vitamin C 500 Mg Tabs (Ascorbic Acid) .... 2 Tabs Once Daily 12)  Calcium Citrate 950 Mg Tabs (Calcium Citrate) .Marland Kitchen.. 1 Tab Once Daily 13)  Pravastatin Sodium 40 Mg Tabs  (Pravastatin Sodium) .... One By Mouth Daily 14)  Dulcolax 5 Mg Tbec (Bisacodyl) .Marland Kitchen.. 1 By Mouth Daily As Needed 15)  Vegetable Laxative Otc .... Take 1 Tablet By Mouth Once A Day 16)  Carvedilol 3.125 Mg Tabs (Carvedilol) .... One Two Times A Day 17)  Flector 1.3 % Ptch (Diclofenac Epolamine) .... As Needed For Back Pain  Allergies (verified): 1)  ! Neosporin 2)  ! Reglan 3)  ! * Bacitracin Ointment 4)  ! Relafen 5)  ! * Artho Complex  Past History:  Past Medical History: Last updated: 10/31/2009 LBBB Nonischemic cardiomyopathy (EF 25-30% echo 2010) Hypertension Diverticulitis  Past Surgical History: Last updated: 07/06/2009 CRT-D-[employer]. Jude's Knee replacement Hysterectomy Appendectomy Neck surgery associated with right arm weakness  Family History: Last updated: 10/31/2009 Negative FH of Diabetes, Hypertension, or Coronary Artery Disease son died suddenly autopsy apparently showed coronary disease but in the son's father's family is a broad history of premature sudden cardiac death that his mom and unexplained Family History Colon Cancer---sister Family History Lung Cancer---father  Social History: Last updated: 07/06/2009 Widowed ; 3 children 2 surviving Tobacco Use - Never  Alcohol Use - no Regular Exercise - no  Vital Signs:  Patient profile:   77 year old female Height:      63 inches Weight:      173 pounds BMI:     30.76 Pulse rate:   73 / minute Pulse rhythm:   regular Cuff size:  regular  Vitals Entered By: Judithe Modest CMA (December 17, 2009 10:53 AM)  Physical Exam  General:  The patient was alert and oriented with mild shortness of breath while talking HEENT Normal.  Neck veins were flat, carotids were brisk.  Lungs were clear.  Heart sounds were regular without murmurs or gallops.  Abdomen was soft with active bowel sounds. There is no clubbing cyanosis or edema. Skin Warm and dry     ICD Specifications Following MD:  Sherryl Manges, MD      ICD Vendor:  St Jude     ICD Model Number:  [id number]     ICD Serial Number:  [id number] ICD DOI:  11/29/2008     ICD Implanting MD:  Sherryl Manges, MD  Lead 1:    Location: RA     DOI: 11/29/2008     Model #: [id number]     Serial #: [id number]     Status: active Lead 2:    Location: RV     DOI: 11/29/2008     Model #: 7120     Serial #: [id number]     Status: active Lead 3:    Location: LV     DOI: 11/29/2008     Model #: 1158T     Serial #: [id number]     Status: active  ICD Follow Up Battery Voltage:  86% V     Charge Time:  8.9 seconds     Battery Est. Longevity:  7.2 YRS Underlying rhythm:  SR ICD Dependent:  No       ICD Device Measurements Atrium:  Amplitude: 2.1 mV, Impedance: 540 ohms, Threshold: 0.5 V at 0.4 msec Right Ventricle:  Amplitude: 12.0 mV, Impedance: 580 ohms, Threshold: 0.5 V at 0.4 msec Left Ventricle:  Impedance: 950 ohms, Threshold: 1.0 V at 0.4 msec Configuration: BIPOLAR Shock Impedance: 43 ohms   Episodes MS Episodes:  0     Coumadin:  No Shock:  0     ATP:  0     Nonsustained:  0     Atrial Therapies:  0 Atrial Pacing:  46%     Ventricular Pacing:  >99%  Brady Parameters Mode DDD     Lower Rate Limit:  60     Upper Rate Limit 120 PAV 140     Sensed AV Delay:  110  Tachy Zones VF:  240     VT:  214     VT1:  171     Next Cardiology Appt Due:  02/26/2010 Tech Comments:  PT COMPLAINS OF SOB AND FATIGUE. TURNED RATE RESPONSE ON AND PERFORMED WALK TEST--PT FELT WORSE WITH RATE RESPONSE. NO EPISODES SINCE LAST CHECK. NORMAL DEVICE FUNCTION. NO CHANGES MADE EXCEPT TURNING RATE RESPONSE ON DURING MODE SWITCH.  Heather Bullock  December 17, 2009 11:28 AM  Impression & Recommendations:  Problem # 1:  ICD CRT  STJ (ICD-V45.02) I have taken the liberty of programming her ventricular pacing off. We'll see  Problem # 2:  HYPOTENSION (ICD-458.9) she continues to have problems with dizziness-orthostatic. I have decreased her lisinopril from 20 mg to 10 mg daily  Problem #  3:  LBBB (ICD-426.3) as above Her updated medication list for this problem includes:    Adult Aspirin Ec Low Strength 81 Mg Tbec (Aspirin) ..... Once daily    Lisinopril 20 Mg Tabs (Lisinopril) .Marland Kitchen... Take 1 tablet by mouth at bedtime    Carvedilol 3.125 Mg Tabs (Carvedilol) ..... One two times a  day  Problem # 4:  SHORTNESS OF BREATH (ICD-786.05) this remains a major issue. As noted neuro-muscular evaluation is pending Her updated medication list for this problem includes:    Adult Aspirin Ec Low Strength 81 Mg Tbec (Aspirin) ..... Once daily    Lisinopril 20 Mg Tabs (Lisinopril) .Marland Kitchen... Take 1 tablet by mouth at bedtime    Furosemide 40 Mg Tabs (Furosemide) .Marland Kitchen... Take one twice daily    Carvedilol 3.125 Mg Tabs (Carvedilol) ..... One two times a day  Patient Instructions: 1)  Your physician recommends that you schedule a follow-up appointment in: 3 MONTHS WITH KRISTIN AND PAULA 2)  Your physician has recommended you make the following change in your medication: LISINOPRIL 10 MG AT BEDTIME

## 2010-05-28 NOTE — Progress Notes (Signed)
  Pt.Signed ROI, faxed over to [facility] on [address] to get records for [facility]  June 13, 2009 12:51 PM    Appended Document:  Recieved Records from [facility] forwarded to [employer] Document:  Recieved another Set of Records from [employer] forwarded to [employer])

## 2010-05-28 NOTE — Assessment & Plan Note (Signed)
Summary: eph/jml   Visit Type:  Follow-up Referring Provider:  Verdis Prime Primary Provider:  Trish Fountain  CC:  edema in legs last evening and sob.  History of Present Illness: This is a 77 year old white female patient with history of cardiomyopathy, whose ejection fraction recently improved from 25-30% up to 55-60%. She continues to have dyspnea on exertion and underwent right heart and left heart catheterization, August 09, 2009. This showed normal left and right heart pressures. She had normal right saturations with no evidence of left to right shunt. There is no equalization of pressures in the left. The right chambers. There is no clear cardiac etiology for her dyspnea. At this point cardiopulmonary stress testing was recommended.  Patient comes in today and still complains of dyspnea on exertion, as well as no energy. She has trouble doing her daily activities, such as shopping or doing laundry. She seems quite depressed overall of this. She states she can't afford cardiac rehabilitation.  Current Medications (verified): 1)  Adult Aspirin Ec Low Strength 81 Mg  Tbec (Aspirin) .... Once Daily 2)  Gabapentin 300 Mg Caps (Gabapentin) .... Take One Tab Two Times A Day 3)  Lisinopril 20 Mg Tabs (Lisinopril) .... Once Daily 4)  Furosemide 40 Mg Tabs (Furosemide) .... Take One Twice Daily 5)  Valium 5 Mg Tabs (Diazepam) .... Take One At Bedtime 6)  Hydrocodone-Acetaminophen 2.5-500 Mg Tabs (Hydrocodone-Acetaminophen) .... As Needed 7)  Multivitamins  Tabs (Multiple Vitamin) .... Once Daily 8)  Prevacid 15 Mg Cpdr (Lansoprazole) .Marland Kitchen.. 1 Tab Once Daily 9)  Osteo Bi-Flex Triple Strength  Tabs (Misc Natural Products) .Marland Kitchen.. 1 Tab Once Daily 10)  Vitamin D (Ergocalciferol) 50000 Unit Caps (Ergocalciferol) .Marland Kitchen.. 1 Tab Weekly 11)  Vitamin C 500 Mg Tabs (Ascorbic Acid) .... 2 Tabs Once Daily 12)  Calcium Citrate 950 Mg Tabs (Calcium Citrate) .Marland Kitchen.. 1 Tab Once Daily 13)  Pravastatin Sodium 40 Mg Tabs  (Pravastatin Sodium) .... One By Mouth Daily 14)  Dulcolax 5 Mg Tbec (Bisacodyl) .Marland Kitchen.. 1 By Mouth Dailyu 15)  Stool Softener 100 Mg Caps (Docusate Sodium) .Marland Kitchen.. 1 Po Daily 16)  Carvedilol 3.125 Mg Tabs (Carvedilol) .... One Two Times A Day 17)  Flector 1.3 % Ptch (Diclofenac Epolamine) .... As Needed For Back Pain  Allergies: 1)  ! Neosporin 2)  ! Reglan 3)  ! * Bacitracin Ointment 4)  ! Relafen 5)  ! * Artho Complex  Past History:  Past Medical History: Last updated: 07/06/2009 LBBB Nonischemic cardiomyopathy (EF 25-30% echo 2010) Hypertension Asthma Diverticulitis  Past Surgical History: Last updated: 07/06/2009 CRT-D-St. Jude's Knee replacement Hysterectomy Appendectomy Neck surgery associated with right arm weakness  Social History: Last updated: 07/06/2009 Widowed ; 3 children 2 surviving Tobacco Use - Never  Alcohol Use - no Regular Exercise - no  Review of Systems       see the history of present illness  Vital Signs:  Patient profile:   77 year old female Height:      63 inches Weight:      169 pounds Pulse rate:   60 / minute Pulse rhythm:   regular BP sitting:   118 / 68  (left arm)  Vitals Entered By: Jacquelin Hawking, CMA (Aug 27, 2009 11:10 AM)  Physical Exam  General:   Well-nournished, in no acute distress. Neck: No JVD, HJR, Bruit, or thyroid enlargement Lungs: No tachypnea, clear without wheezing, rales, or rhonchi Cardiovascular: RRR, PMI not displaced, heart sounds normal, no murmurs,  gallops, bruit, thrill, or heave. Abdomen: BS normal. Soft without organomegaly, masses, lesions or tenderness. Extremitiesthe right groin without hematoma or hemorrhage,without cyanosis, clubbing or edema. Good distal pulses bilateral SKin: Warm, no lesions or rashes  Musculoskeletal: No deformities Neuro: no focal signs     ICD Specifications Following MD:  Sherryl Manges, MD     ICD Vendor:  St Jude     ICD Model Number:  ZO1096-04     ICD Serial  Number:  [id number] ICD DOI:  11/29/2008     ICD Implanting MD:  Sherryl Manges, MD  Lead 1:    Location: RA     DOI: 11/29/2008     Model #: [id number]     Serial #: [id number]     Status: active Lead 2:    Location: RV     DOI: 11/29/2008     Model #: 7120     Serial #: [id number]     Status: active Lead 3:    Location: LV     DOI: 11/29/2008     Model #: 1158T     Serial #: [id number]     Status: active  ICD Follow Up ICD Dependent:  No       ICD Device Measurements Configuration: BIPOLAR  Episodes Coumadin:  No  Brady Parameters Mode DDD     Lower Rate Limit:  60     Upper Rate Limit 120 PAV 140     Sensed AV Delay:  110  Tachy Zones VF:  240     VT:  214     VT1:  171     Impression & Recommendations:  Problem # 1:  CARDIOMYOPATHY, PRIMARY, DILATED (ICD-425.4) patient had a recent echo, August 06, 2009 with improvement of her ejection fraction from 55-60%. Right and left heart catheterization did not reveal a clear etiology for her dyspnea. Cardiopulmonary stress testing has been recommended. The patient remains symptomatic. Blood pressures and weights from home have been very stable. I spent about 20 min of time with the patient answering all of her questions. Her updated medication list for this problem includes:    Adult Aspirin Ec Low Strength 81 Mg Tbec (Aspirin) ..... Once daily    Lisinopril 20 Mg Tabs (Lisinopril) ..... Once daily    Furosemide 40 Mg Tabs (Furosemide) .Marland Kitchen... Take one twice daily    Carvedilol 3.125 Mg Tabs (Carvedilol) ..... One two times a day  Problem # 2:  SHORTNESS OF BREATH (ICD-786.05) Patient's dyspnea continues. Cardiopulmonary testing will be ordered. Her updated medication list for this problem includes:    Adult Aspirin Ec Low Strength 81 Mg Tbec (Aspirin) ..... Once daily    Lisinopril 20 Mg Tabs (Lisinopril) ..... Once daily    Furosemide 40 Mg Tabs (Furosemide) .Marland Kitchen... Take one twice daily    Carvedilol 3.125 Mg Tabs (Carvedilol) ..... One two times a  day  Other Orders: [facility] (CPX Test)  Patient Instructions: 1)  Your physician recommends that you schedule a follow-up appointment in: 1 months with Dr. Antoine Poche. 2)  Your physician has recommended that you have a cardiopulmonary stress test (CPX).  CPX testing is a non-invasive measurement of heart and lung function. It replaces a traditional treadmill stress test. This type of test provides a tremendous amount of information that relates not only to your present condition but also for future outcomes.  This test combines measurements of your ventilation, respiratory gas exchange in the lungs, electrocardiogram (EKG),  blood pressure and physical response before, during, and following an exercise protocol.

## 2010-05-28 NOTE — Assessment & Plan Note (Signed)
Summary: consult for dyspnea   Visit Type:  Initial Consult Copy to:  Dr. Antoine Poche Primary Provider/Referring Provider:  Dr. [name]  CC:  Pulmonary consult for increased sob. the patient c/o sob with exertion and at rest..  History of Present Illness: The pt is a 77y/o female who I have been asked to see for dyspnea.  She is known to me from a previous evaluation in 2007, where nothing specific was found.  She had an unremarkable right and left heart cath, unimpressive hrct, and pfts which showed only mild airtrapping and couldn't do DLCO manuever.  She was tried on symbicort for a short period with no change in symptoms.  She had ENT eval for her chronic dysphonia, and tells me she had nothing structurally going on.  I had also referred her to neuro for possible muscle weakness, and have never received those records.  The pt tells me they didn't think she had anything to explain her sob, but I will try and obtain those records.  Currently, the pt is continuing to have doe that is interfering with her QOL, but does not feel it has been progressive from 2007.  She still finds it difficult to take a deep breath, and get chest pressure when she exerts herself that does not go away until she lies down to rest.  She still has a weak voice with intermittant dysphonia.  She experiences dyspnea just walking thru her house, and with any of her ADL's.  She denies any cough or mucus.  She tells me that her breathing was very good when they first put in her pacer, but over a period of time it went back to her previous level.  The pt weights is actually 4 pounds less than when I last saw her.  She has had a f/u cardiac evaluation with normal right heart cath and normal shunt run, echo that showed improvement in her EF, and a CPST that showed no cardiopulmonary limitation but did reveal poor exercise tolerance due to her back issues.    Preventive Screening-Counseling & Management  Alcohol-Tobacco     Alcohol  drinks/day: 0     Smoking Status: never  Current Medications (verified): 1)  Adult Aspirin Ec Low Strength 81 Mg  Tbec (Aspirin) .... Once Daily 2)  Gabapentin 300 Mg Caps (Gabapentin) .... Take One Tab Two Times A Day 3)  Lisinopril 20 Mg Tabs (Lisinopril) .... Take 1 Tablet By Mouth At Bedtime 4)  Furosemide 40 Mg Tabs (Furosemide) .... Take One Twice Daily 5)  Valium 5 Mg Tabs (Diazepam) .... Take One At Bedtime 6)  Hydrocodone-Acetaminophen 2.5-500 Mg Tabs (Hydrocodone-Acetaminophen) .... As Needed 7)  Multivitamins  Tabs (Multiple Vitamin) .... Once Daily 8)  Prevacid 15 Mg Cpdr (Lansoprazole) .Marland Kitchen.. 1 Tab Once Daily 9)  Osteo Bi-Flex Triple Strength  Tabs (Misc Natural Products) .Marland Kitchen.. 1 Tab Once Daily 10)  Vitamin D (Ergocalciferol) 50000 Unit Caps (Ergocalciferol) .Marland Kitchen.. 1 Tab Weekly 11)  Vitamin C 500 Mg Tabs (Ascorbic Acid) .... 2 Tabs Once Daily 12)  Calcium Citrate 950 Mg Tabs (Calcium Citrate) .Marland Kitchen.. 1 Tab Once Daily 13)  Pravastatin Sodium 40 Mg Tabs (Pravastatin Sodium) .... One By Mouth Daily 14)  Dulcolax 5 Mg Tbec (Bisacodyl) .Marland Kitchen.. 1 By Mouth Dailyu 15)  Stool Softener 100 Mg Caps (Docusate Sodium) .Marland Kitchen.. 1 Po Daily 16)  Carvedilol 3.125 Mg Tabs (Carvedilol) .... One Two Times A Day 17)  Flector 1.3 % Ptch (Diclofenac Epolamine) .... As Needed  For Back Pain  Allergies (verified): 1)  ! Neosporin 2)  ! Reglan 3)  ! * Bacitracin Ointment 4)  ! Relafen 5)  ! * Artho Complex  Past History:  Past Medical History: LBBB Nonischemic cardiomyopathy (EF 25-30% echo 2010) Hypertension Diverticulitis  Past Surgical History: Reviewed history from 07/06/2009 and no changes required. CRT-D-[employer] Knee replacement Hysterectomy Appendectomy Neck surgery associated with right arm weakness  Family History: Reviewed history from 10/10/2008 and no changes required. Negative FH of Diabetes, Hypertension, or Coronary Artery Disease son died suddenly autopsy apparently showed  coronary disease but in the son's father's family is a broad history of premature sudden cardiac death that his mom and unexplained Family History Colon Cancer---sister Family History Lung Cancer---father  Social History: Reviewed history from 07/06/2009 and no changes required. Widowed ; 3 children 2 surviving Tobacco Use - Never  Alcohol Use - no Regular Exercise - no Alcohol drinks/day:  0  Review of Systems       The patient complains of shortness of breath with activity, shortness of breath at rest, chest pain, hand/feet swelling, and joint stiffness or pain.  The patient denies productive cough, non-productive cough, coughing up blood, irregular heartbeats, acid heartburn, indigestion, loss of appetite, weight change, abdominal pain, difficulty swallowing, sore throat, tooth/dental problems, headaches, nasal congestion/difficulty breathing through nose, sneezing, itching, ear ache, anxiety, depression, rash, change in color of mucus, and fever.    Vital Signs:  Patient profile:   77 year old female Height:      63 inches (160.02 cm) Weight:      174 pounds (79.09 kg) BMI:     30.93 O2 Sat:      99 % on Room air Temp:     97.8 degrees F (36.56 degrees C) oral Pulse rate:   78 / minute BP sitting:   118 / 80  (left arm) Cuff size:   regular  Vitals Entered By: Michel Bickers CMA (October 31, 2009 10:48 AM)  O2 Sat at Rest %:  99 O2 Flow:  Room air CC: Pulmonary consult for increased sob. the patient c/o sob with exertion and at rest. Comments Medications reviewed. Daytime phone verified. Michel Bickers CMA  October 31, 2009 10:50 AM   Physical Exam  General:  ow female in nad Eyes:  PERRLA and EOMI.   Nose:  patent without discharge Mouth:  clear Neck:  no jvd, tmg, LN Lungs:  totally clear to auscultation poor chest wall excursion during deep inspiration Heart:  rrr, no mrg Abdomen:  soft and nontender, bs+ Extremities:  mild edema, pulses intact distally no  cyanosis Neurologic:  alert and oriented, moves all 4.   Impression & Recommendations:  Problem # 1:  SHORTNESS OF BREATH (ICD-786.05) the pt is continuing to have dyspnea that is unexplained.  She has had an extensive cardiac and pulmonary w/u in the past, and repeat cardiac w/u presently with nothing being found to explain her symptoms.  I am still impressed as before with her poor inspiratory excursion, her voice changes, and the fact that she gets very sob when lying down unless elevated on multiple pillows.  I wonder if she has some type of neuromuscular disease, or if her spine issues (especially in neck) are resulting in nerve compression/palsy of critical nerve roots which supply the specific muscles of respiration?? This may explain why there is no specific cardiopulmonary pathology found, but an issue with the "bellows"?  I would like to recheck  her pfts along with muscle pressures, and may also recheck HRCT.  Will try and obtain records from neurology as well.    Other Orders: Consultation Level V (66440) Pulmonary Referral (Pulmonary) T-2 View CXR (71020TC)  Patient Instructions: 1)  will set up for breathing studies and chest xray, and will arrange for followup once results are available.  Appended Document: consult for dyspnea received results of PFTs from [facility] and put in your very important look at folder.    Appended Document: consult for dyspnea pfts show no obstruction except minimal airtrapping, mild restriction, and a moderate decrease in dlco that corrects with Av.  Her TLC is actually better than last check a few years ago.  She could not do DLCO manuever in past for comparison to current value.  The pt was not able to do muscle pressures due to extreme fatigue.  Megan, let pt know that pfts show she does have an abnormal "lung capacity", but the numbers are actually better than previous study.  She needs a repeat ct chest to compare to last years to see if that has  changed.  Go ahead and make ov after ct so we can discuss results.  Will send an order to [facility] once you give me the ok.  Appended Document: consult for dyspnea called and spoke with pt.  pt aware of PFT results. Pt verbalized understanding.  Pt ok with scheduling f/u CT scan.  Will schedule pt for an OV to discuss these results once I know the date of the CT scan.  Will forward message back to [facility] to send order for CT to [facility].

## 2010-05-30 NOTE — Assessment & Plan Note (Signed)
Summary: Heather Bullock for dyspnea   Visit Type:  Follow-up Copy to:  Dr. Annitta Jersey Neurologic Primary Destenie Ingber/Referring Jahniah Pallas:  Dr. [name]  CC:  follow up. pt states her sob is not doing well during the day. pt states she uses her bipap everynight x5-6 hrs a night. Pt states she is having no problems with her mask. pt states her mask is making marks on her face.  History of Present Illness: the pt comes in today for f/u of her dyspnea.  She is felt to have some type of neuromuscular issue that is contributing to this problem, and she is being tried on bilevel at HS to see if this will help respiratory muscle fatigue during the day to improve her breathing.  She has seen a great improvement in her sleep, and feels much more rested upon arising.  However, she has not seen a difference in her sob during the day.  She has had no issues with mask fit or pressure tolerance.  Current Medications (verified): 1)  Adult Aspirin Ec Low Strength 81 Mg  Tbec (Aspirin) .... Once Daily 2)  Gabapentin 300 Mg Caps (Gabapentin) .... Take One Tab Two Times A Day 3)  Furosemide 40 Mg Tabs (Furosemide) .... Take One Twice Daily 4)  Valium 5 Mg Tabs (Diazepam) .... Take 1/2 Tab By Mouth Two Times A Day 5)  Hydrocodone-Acetaminophen 10-500 Mg Tabs (Hydrocodone-Acetaminophen) .... Take 1 Tab By Mouth At Bedtime and As Needed 6)  Multivitamins  Tabs (Multiple Vitamin) .... Once Daily 7)  Prevacid 15 Mg Cpdr (Lansoprazole) .Marland Kitchen.. 1 Tab Once Daily 8)  Osteo Bi-Flex Triple Strength  Tabs (Misc Natural Products) .... 2 Tab Once Daily 9)  Vitamin D (Ergocalciferol) 50000 Unit Caps (Ergocalciferol) .Marland Kitchen.. 1 Tab Weekly 10)  Vitamin C 500 Mg Tabs (Ascorbic Acid) .... 2 Tabs Once Daily 11)  Calcium Citrate 950 Mg Tabs (Calcium Citrate) .Marland Kitchen.. 1 Tab Once Daily 12)  Pravastatin Sodium 40 Mg Tabs (Pravastatin Sodium) .... One By Mouth Daily 13)  Dulcolax 5 Mg Tbec (Bisacodyl) .Marland Kitchen.. 1 By Mouth Daily As Needed 14)  Vegetable  Laxative Otc .... Take 1 Tablet By Mouth Once A Day As Needed 15)  Carvedilol 3.125 Mg Tabs (Carvedilol) .... One Two Times A Day 16)  Flector 1.3 % Ptch (Diclofenac Epolamine) .... As Needed For Back Pain 17)  Losartan Potassium 25 Mg Tabs (Losartan Potassium) .... One Daily By Mouth  Allergies (verified): 1)  ! Neosporin 2)  ! Reglan 3)  ! * Bacitracin Ointment 4)  ! Relafen 5)  ! * Artho Complex 6)  ! * Non Steriodal Anti Inflammatories 7)  ! * Nitroglycerin  Review of Systems       The patient complains of shortness of breath with activity, shortness of breath at rest, non-productive cough, difficulty swallowing, hand/feet swelling, and joint stiffness or pain.  The patient denies productive cough, coughing up blood, chest pain, irregular heartbeats, acid heartburn, indigestion, weight change, abdominal pain, tooth/dental problems, headaches, nasal congestion/difficulty breathing through nose, sneezing, itching, ear ache, anxiety, depression, rash, change in color of mucus, and fever.    Vital Signs:  Patient profile:   77 year old female Height:      63 inches Weight:      174.38 pounds BMI:     31.00 O2 Sat:      95 % on Room air Temp:     97.9 degrees F oral Pulse rate:   68 / minute BP  sitting:   120 / 62  (left arm) Cuff size:   regular  Vitals Entered By: Carver Fila (April 01, 2010 1:38 PM)  O2 Flow:  Room air CC: follow up. pt states her sob is not doing well during the day. pt states she uses her bipap everynight x5-6 hrs a night. Pt states she is having no problems with her mask. pt states her mask is making marks on her face Comments meds and allergies updated Phone number updated Carver Fila  April 01, 2010 1:38 PM    Physical Exam  General:  wd female in nad Nose:  no skin breakdown or pressure necrosis from cpap mask Lungs:  clear to auscultation Heart:  rrr Extremities:  no significant edema, no cyanosis  Neurologic:  alert, oriented, moves all  4   Impression & Recommendations:  Problem # 1:  SHORTNESS OF BREATH (ICD-786.05)  felt to be secondary to some type of neuromuscular process?  She is scheduled to see neurology in the near future, and has had what sounds like a tensilon test??  She has seen a significant improvement in her sleep with the bilevel, but has not seen a difference in her breathing.  She has had a complete pulmonary w/u, with nothing specific being found.    Other Orders: Est. Patient Level III (45409)  Patient Instructions: 1)  continue with bilevel 2)  followup with me in 6mos, but we will talk again once I receive the new consultation from your upcoming neurology apptm.

## 2010-05-30 NOTE — Miscellaneous (Signed)
Summary: Physician Statement for Device / [facility]  Physician Statement for Device / [facility]   Imported By: Lennie Odor 05/10/2010 09:55:19  _____________________________________________________________________  External Attachment:    Type:   Image     Comment:   External Document

## 2010-05-31 NOTE — Letter (Signed)
Summary: [facility]  [facility] Progress [facility]   Imported By: Roderic Ovens 07/13/2009 13:35:03  _____________________________________________________________________  External Attachment:    Type:   Image     Comment:   External Document

## 2010-05-31 NOTE — Cardiovascular Report (Signed)
Summary: Office Note   Office Note   Imported By: Roderic Ovens 10/09/2009 13:20:51  _____________________________________________________________________  External Attachment:    Type:   Image     Comment:   External Document

## 2010-05-31 NOTE — Letter (Signed)
Summary: [facility]  [facility]   Imported By: Roderic Ovens 08/16/2009 15:25:23  _____________________________________________________________________  External Attachment:    Type:   Image     Comment:   External Document

## 2010-06-18 ENCOUNTER — Telehealth (INDEPENDENT_AMBULATORY_CARE_PROVIDER_SITE_OTHER): Payer: Self-pay | Admitting: *Deleted

## 2010-06-20 ENCOUNTER — Encounter (INDEPENDENT_AMBULATORY_CARE_PROVIDER_SITE_OTHER): Payer: Medicare PPO

## 2010-06-20 ENCOUNTER — Encounter: Payer: Self-pay | Admitting: Internal Medicine

## 2010-06-20 DIAGNOSIS — I428 Other cardiomyopathies: Secondary | ICD-10-CM

## 2010-06-25 ENCOUNTER — Ambulatory Visit: Payer: Self-pay | Admitting: Cardiology

## 2010-06-25 NOTE — Progress Notes (Signed)
  Phone Note Other Incoming   Request: Send information Summary of Call: Mailed patient a [facility] medical release form to  [address] [place], [place] [address]

## 2010-07-04 NOTE — Procedures (Signed)
Summary: device/saf  [facility]   Current Medications (verified): 1)  Adult Aspirin Ec Low Strength 81 Mg  Tbec (Aspirin) .... Once Daily 2)  Gabapentin 300 Mg Caps (Gabapentin) .... Take One Tab Two Times A Day 3)  Furosemide 40 Mg Tabs (Furosemide) .... Take One Twice Daily 4)  Valium 5 Mg Tabs (Diazepam) .... Take 1/2 Tab By Mouth Two Times A Day 5)  Hydrocodone-Acetaminophen 10-500 Mg Tabs (Hydrocodone-Acetaminophen) .... Take 1 Tab By Mouth At Bedtime and As Needed 6)  Multivitamins  Tabs (Multiple Vitamin) .... Once Daily 7)  Prevacid 15 Mg Cpdr (Lansoprazole) .Marland Kitchen.. 1 Tab Once Daily 8)  Osteo Bi-Flex Triple Strength  Tabs (Misc Natural Products) .... 2 Tab Once Daily 9)  Vitamin D (Ergocalciferol) 50000 Unit Caps (Ergocalciferol) .Marland Kitchen.. 1 Tab Weekly 10)  Vitamin C 500 Mg Tabs (Ascorbic Acid) .... 2 Tabs Once Daily 11)  Calcium Citrate 950 Mg Tabs (Calcium Citrate) .Marland Kitchen.. 1 Tab Once Daily 12)  Pravastatin Sodium 40 Mg Tabs (Pravastatin Sodium) .... One By Mouth Daily 13)  Dulcolax 5 Mg Tbec (Bisacodyl) .Marland Kitchen.. 1 By Mouth Daily As Needed 14)  Vegetable Laxative Otc .... Take 1 Tablet By Mouth Once A Day As Needed 15)  Carvedilol 3.125 Mg Tabs (Carvedilol) .... One Two Times A Day 16)  Flector 1.3 % Ptch (Diclofenac Epolamine) .... As Needed For Back Pain 17)  Losartan Potassium 25 Mg Tabs (Losartan Potassium) .... One Daily By Mouth  Allergies (verified): 1)  ! Neosporin 2)  ! Reglan 3)  ! * Bacitracin Ointment 4)  ! Relafen 5)  ! * Artho Complex 6)  ! * Non Steriodal Anti Inflammatories 7)  ! * Nitroglycerin   ICD Specifications Following MD:  Sherryl Manges, MD     ICD Vendor:  [facility] [employer]     ICD Model Number:  [id number]     ICD Serial Number:  [id number] ICD DOI:  11/29/2008     ICD Implanting MD:  Sherryl Manges, MD  Lead 1:    Location: RA     DOI: 11/29/2008     Model #: [id number]     Serial #: [id number]     Status: active Lead 2:    Location: RV     DOI: 11/29/2008     Model #: [id number]     Serial #:  [id number]     Status: active Lead 3:    Location: LV     DOI: 11/29/2008     Model #: [id number]     Serial #: [id number]     Status: active  ICD Follow Up ICD Dependent:  No       ICD Device Measurements Configuration: BIPOLAR  Episodes Coumadin:  No  Brady Parameters Mode DDD     Lower Rate Limit:  60     Upper Rate Limit 120 PAV 140     Sensed AV Delay:  110  Tachy Zones VF:  240     VT:  214     VT1:  171     Tech Comments:  See [employer]

## 2010-07-04 NOTE — Cardiovascular Report (Signed)
Summary: Office Visit   Office Visit   Imported By: Roderic Ovens 06/27/2010 16:16:04  _____________________________________________________________________  External Attachment:    Type:   Image     Comment:   External Document

## 2010-07-08 ENCOUNTER — Ambulatory Visit (INDEPENDENT_AMBULATORY_CARE_PROVIDER_SITE_OTHER): Payer: Medicare PPO | Admitting: Cardiology

## 2010-07-08 ENCOUNTER — Encounter: Payer: Self-pay | Admitting: Cardiology

## 2010-07-08 DIAGNOSIS — I10 Essential (primary) hypertension: Secondary | ICD-10-CM

## 2010-07-08 DIAGNOSIS — I5032 Chronic diastolic (congestive) heart failure: Secondary | ICD-10-CM

## 2010-07-16 NOTE — Assessment & Plan Note (Signed)
Summary: rov/sl/sp   Visit Type:  Follow-up Primary Provider:  Dr. [name]  CC:  Dyspnea .  History of Present Illness: The patient presents for followup of her dyspnea. Since I last saw her in November she saw Dr. Shelle Iron and she has been treated with CPAP. This helps her breathing at night. However, she is still quite dyspneic during the day. It is thought there might be some component of a neuromuscular disorder. It was suggested that she be seen at [facility]. However, she does not want to do this. She does get dyspneic with mild exertion. She is not describing new PND or orthopnea. She is not describing new palpitations, presyncope or syncope. She has diffuse joint and muscle aches and has been diagnosed with fibromyalgia. She also has some chronic back pain.  Current Medications (verified): 1)  Adult Aspirin Ec Low Strength 81 Mg  Tbec (Aspirin) .... Once Daily 2)  Gabapentin 300 Mg Caps (Gabapentin) .... Take One Tab Two Times A Day 3)  Furosemide 40 Mg Tabs (Furosemide) .... Take One Twice Daily 4)  Valium 5 Mg Tabs (Diazepam) .... Take 1/2 Tab By Mouth Two Times A Day 5)  Hydrocodone-Acetaminophen 10-500 Mg Tabs (Hydrocodone-Acetaminophen) .... Take 1 Tab By Mouth At Bedtime and As Needed 6)  Multivitamins  Tabs (Multiple Vitamin) .... Once Daily 7)  Prevacid 15 Mg Cpdr (Lansoprazole) .Marland Kitchen.. 1 Tab Once Daily 8)  Osteo Bi-Flex Triple Strength  Tabs (Misc Natural Products) .... 2 Tab Once Daily 9)  Vitamin D (Ergocalciferol) 50000 Unit Caps (Ergocalciferol) .Marland Kitchen.. 1 Tab Weekly 10)  Vitamin C 500 Mg Tabs (Ascorbic Acid) .... 2 Tabs Once Daily 11)  Calcium Citrate 950 Mg Tabs (Calcium Citrate) .Marland Kitchen.. 1 Tab Once Daily 12)  Pravastatin Sodium 40 Mg Tabs (Pravastatin Sodium) .... One By Mouth Daily 13)  Dulcolax 5 Mg Tbec (Bisacodyl) .Marland Kitchen.. 1 By Mouth Daily As Needed 14)  Vegetable Laxative Otc .... Take 1 Tablet By Mouth Once A Day As Needed 15)  Carvedilol 3.125 Mg Tabs (Carvedilol) .... One Two  Times A Day 16)  Flector 1.3 % Ptch (Diclofenac Epolamine) .... As Needed For Back Pain 17)  Losartan Potassium 25 Mg Tabs (Losartan Potassium) .... One Daily By Mouth  Allergies (verified): 1)  ! Neosporin 2)  ! Reglan 3)  ! * Bacitracin Ointment 4)  ! Relafen 5)  ! * Artho Complex 6)  ! * Non Steriodal Anti Inflammatories 7)  ! * Nitroglycerin  Past History:  Past Medical History: LBBB Nonischemic cardiomyopathy (EF 25-30% echo 2010, 60% by echo November 2001) Hypertension Diverticulitis  Past Surgical History: Reviewed history from 07/06/2009 and no changes required. CRT-D-St. Jude's Knee replacement Hysterectomy Appendectomy Neck surgery associated with right arm weakness  Review of Systems       As stated in the HPI and negative for all other systems.   Vital Signs:  Patient profile:   77 year old female Height:      63 inches Weight:      171 pounds BMI:     30.40 Pulse rate:   64 / minute Resp:     18 per minute BP sitting:   138 / 72  (right arm)  Vitals Entered By: Marrion Coy, CNA (July 08, 2010 11:54 AM)  Physical Exam  General:  Well developed, well nourished, in no acute distress. Head:  normocephalic and atraumatic Eyes:  PERRLA/EOM intact; conjunctiva and lids normal. Mouth:  Teeth, gums and palate normal. Oral mucosa  normal. Neck:  Neck supple, no JVD. No masses, thyromegaly or abnormal cervical nodes. Lungs:  Clear bilaterally to auscultation and percussion. Abdomen:  Bowel sounds positive; abdomen soft and non-tender without masses, organomegaly, or hernias noted. No hepatosplenomegaly. Msk:  Back normal, normal gait. Muscle strength and tone normal. Pulses:  pulses normal in all 4 extremities Extremities:  No clubbing or cyanosis, Mild edema Neurologic:  Alert and oriented x 3. Skin:  Intact without lesions or rashes. Cervical Nodes:  no significant adenopathy Axillary Nodes:  no significant adenopathy Inguinal Nodes:  no significant  adenopathy Psych:  Normal affect.   Detailed Cardiovascular Exam  Neck    Carotids: Carotids full and equal bilaterally without bruits.      Neck Veins: Normal, no JVD.    Heart    Inspection: no deformities or lifts noted.      Palpation: normal PMI with no thrills palpable.      Auscultation: regular rate and rhythm, S1, S2 without murmurs, rubs, gallops, or clicks.    Vascular    Abdominal Aorta: no palpable masses, pulsations, or audible bruits.      Femoral Pulses: normal femoral pulses bilaterally.      Pedal Pulses: normal pedal pulses bilaterally.      Radial Pulses: normal radial pulses bilaterally.      Peripheral Circulation: no clubbing, cyanosis,  with normal capillary refill.     EKG  Procedure date:  07/08/2010  Findings:      Sinus rhythm, rate 65, left bundle branch block, left axis deviation   ICD Specifications Following MD:  Sherryl Manges, MD     ICD Vendor:  St Jude     ICD Model Number:  EA5409-81     ICD Serial Number:  [id number] ICD DOI:  11/29/2008     ICD Implanting MD:  Sherryl Manges, MD  Lead 1:    Location: RA     DOI: 11/29/2008     Model #: [id number]     Serial #: [id number]     Status: active Lead 2:    Location: RV     DOI: 11/29/2008     Model #: [id number]     Serial #: [id number]     Status: active Lead 3:    Location: LV     DOI: 11/29/2008     Model #: [id number]     Serial #: [id number]     Status: active  ICD Follow Up ICD Dependent:  No       ICD Device Measurements Configuration: BIPOLAR  Episodes Coumadin:  No  Brady Parameters Mode DDD     Lower Rate Limit:  60     Upper Rate Limit 120 PAV 140     Sensed AV Delay:  110  Tachy Zones VF:  240     VT:  214     VT1:  171     Impression & Recommendations:  Problem # 1:  SHORTNESS OF BREATH (ICD-786.05) I had had no suggestion that this was recurrent systolic dysfunction. BNP was normal in the fall. It is probably multifactorial. Because she has some increased lower extremity edema today I will  increase Lasix by 20 mg for 2 days only and repeat a basic metabolic profile with another BNP in 2 weeks.  Problem # 2:  LBBB (ICD-426.3) We reviewed this physiology. No change in therapy is indicated. Orders: EKG w/ Interpretation (93000)  Problem # 3:  HYPOTENSION (ICD-458.9) She brings a blood pressure diary  today which I reviewed. Her blood pressures are actually trending up about 140 systolic and so I will increase her Cozaar to 25 b.i.d.  Patient Instructions: 1)  Your physician recommends that you schedule a follow-up appointment in: 6 months with Dr Antoine Poche 2)  Your physician recommends that you return for lab work in:  2 weeks for BNP and BMP  428.32  401.1 v58.69 3)  Your physician has recommended you make the following change in your medication: Increase Losartan to 25 mg two times a day, take extra Lasix for 2 days Prescriptions: CARVEDILOL 3.125 MG TABS (CARVEDILOL) one two times a day  #180 x 3   Entered by:   Charolotte Capuchin, RN   Authorized by:   Rollene Rotunda, MD, [facility]   Signed by:   Charolotte Capuchin, RN on 07/08/2010   Method used:   Faxed to ...       [facility] (mail-order)             , [place]         Ph: [phone]       Fax: [phone]   RxID:   [id number] PRAVASTATIN SODIUM 40 MG TABS (PRAVASTATIN SODIUM) one by mouth daily  #90 x 3   Entered by:   Charolotte Capuchin, RN   Authorized by:   Rollene Rotunda, MD, [facility]   Signed by:   Charolotte Capuchin, RN on 07/08/2010   Method used:   Faxed to ...       [facility] (mail-order)             , [place]         Ph: [phone]       Fax: [phone]   RxID:   [id number] FUROSEMIDE 40 MG TABS (FUROSEMIDE) take one twice daily  #180 x 3   Entered by:   Charolotte Capuchin, RN   Authorized by:   Rollene Rotunda, MD, [facility]   Signed by:   Charolotte Capuchin, RN on 07/08/2010   Method used:   Faxed to ...       [facility] (mail-order)             , [place]         Ph:  [phone]       Fax: [phone]   RxID:   [id number] LOSARTAN POTASSIUM 25 MG TABS (LOSARTAN POTASSIUM) one two times a day  #180 x 3   Entered by:   Charolotte Capuchin, RN   Authorized by:   Rollene Rotunda, MD, [facility]   Signed by:   Charolotte Capuchin, RN on 07/08/2010   Method used:   Faxed to ...       [facility] (mail-order)             , [place]         Ph: [phone]       Fax: [phone]   RxID:   [id number]  I have reviewed and approved all prescriptions at the time of this visit. Rollene Rotunda, MD, [facility]  July 08, 2010 12:53 PM

## 2010-07-17 LAB — POCT I-STAT 3, VENOUS BLOOD GAS (G3P V)
Acid-Base Excess: 1 mmol/L (ref 0.0–2.0)
Acid-Base Excess: 2 mmol/L (ref 0.0–2.0)
Acid-Base Excess: 2 mmol/L (ref 0.0–2.0)
Bicarbonate: 26.4 mEq/L — ABNORMAL HIGH (ref 20.0–24.0)
Bicarbonate: 26.8 mEq/L — ABNORMAL HIGH (ref 20.0–24.0)
Bicarbonate: 27.9 mEq/L — ABNORMAL HIGH (ref 20.0–24.0)
Bicarbonate: 28.6 mEq/L — ABNORMAL HIGH (ref 20.0–24.0)
Bicarbonate: 28.7 mEq/L — ABNORMAL HIGH (ref 20.0–24.0)
O2 Saturation: 62 %
O2 Saturation: 67 %
O2 Saturation: 67 %
TCO2: 29 mmol/L (ref 0–100)
TCO2: 29 mmol/L (ref 0–100)
TCO2: 29 mmol/L (ref 0–100)
TCO2: 30 mmol/L (ref 0–100)
TCO2: 30 mmol/L (ref 0–100)
pCO2, Ven: 45.9 mmHg (ref 45.0–50.0)
pCO2, Ven: 46.1 mmHg (ref 45.0–50.0)
pCO2, Ven: 47.3 mmHg (ref 45.0–50.0)
pCO2, Ven: 47.5 mmHg (ref 45.0–50.0)
pCO2, Ven: 48.2 mmHg (ref 45.0–50.0)
pCO2, Ven: 49.4 mmHg (ref 45.0–50.0)
pH, Ven: 7.372 — ABNORMAL HIGH (ref 7.250–7.300)
pH, Ven: 7.373 — ABNORMAL HIGH (ref 7.250–7.300)
pH, Ven: 7.373 — ABNORMAL HIGH (ref 7.250–7.300)
pH, Ven: 7.389 — ABNORMAL HIGH (ref 7.250–7.300)
pH, Ven: 7.457 — ABNORMAL HIGH (ref 7.250–7.300)
pO2, Ven: 33 mmHg (ref 30.0–45.0)
pO2, Ven: 36 mmHg (ref 30.0–45.0)
pO2, Ven: 36 mmHg (ref 30.0–45.0)
pO2, Ven: 36 mmHg (ref 30.0–45.0)

## 2010-07-17 LAB — POCT I-STAT 3, ART BLOOD GAS (G3+)
Acid-Base Excess: 2 mmol/L (ref 0.0–2.0)
O2 Saturation: 93 %
TCO2: 28 mmol/L (ref 0–100)
pCO2 arterial: 43.8 mmHg (ref 35.0–45.0)

## 2010-07-22 ENCOUNTER — Other Ambulatory Visit (INDEPENDENT_AMBULATORY_CARE_PROVIDER_SITE_OTHER): Payer: Medicare PPO | Admitting: *Deleted

## 2010-07-22 DIAGNOSIS — I5022 Chronic systolic (congestive) heart failure: Secondary | ICD-10-CM

## 2010-07-22 DIAGNOSIS — R0609 Other forms of dyspnea: Secondary | ICD-10-CM

## 2010-07-22 DIAGNOSIS — Z79899 Other long term (current) drug therapy: Secondary | ICD-10-CM

## 2010-07-22 DIAGNOSIS — I10 Essential (primary) hypertension: Secondary | ICD-10-CM

## 2010-07-22 LAB — BASIC METABOLIC PANEL
BUN: 27 mg/dL — ABNORMAL HIGH (ref 6–23)
Chloride: 102 mEq/L (ref 96–112)
GFR: 44.27 mL/min — ABNORMAL LOW (ref >60.00)
Potassium: 4.2 mEq/L (ref 3.5–5.1)
Sodium: 140 mEq/L (ref 135–145)

## 2010-07-22 LAB — BRAIN NATRIURETIC PEPTIDE: Pro B Natriuretic peptide (BNP): 74.5 pg/mL (ref 0.0–100.0)

## 2010-07-24 ENCOUNTER — Telehealth: Payer: Self-pay | Admitting: *Deleted

## 2010-07-24 NOTE — Telephone Encounter (Signed)
Message copied by Rocco Serene on Wed Jul 24, 2010  9:05 AM ------      Message from: Rollene Rotunda      Created: Tue Jul 23, 2010  8:35 PM       The labs are OK.  Call Ms. Spaugh with the results and send results to the primary provider.

## 2010-08-03 LAB — HEMOGLOBIN AND HEMATOCRIT, BLOOD
HCT: 32.7 % — ABNORMAL LOW (ref 36.0–46.0)
Hemoglobin: 11.2 g/dL — ABNORMAL LOW (ref 12.0–15.0)

## 2010-08-15 ENCOUNTER — Ambulatory Visit (INDEPENDENT_AMBULATORY_CARE_PROVIDER_SITE_OTHER): Payer: Medicare PPO | Admitting: Pulmonary Disease

## 2010-08-15 ENCOUNTER — Encounter: Payer: Self-pay | Admitting: Pulmonary Disease

## 2010-08-15 VITALS — BP 118/64 | HR 65 | Temp 98.0°F | Ht 63.0 in | Wt 173.4 lb

## 2010-08-15 DIAGNOSIS — R0600 Dyspnea, unspecified: Secondary | ICD-10-CM | POA: Insufficient documentation

## 2010-08-15 DIAGNOSIS — R0609 Other forms of dyspnea: Secondary | ICD-10-CM

## 2010-08-15 NOTE — Patient Instructions (Signed)
Stay on bipap, and keep up with mask changes and supplies followup with me in one year.

## 2010-08-15 NOTE — Progress Notes (Signed)
  Subjective:    Patient ID: Heather Bullock, female    DOB: [birth year], 77 y.o.   MRN: [id number]  HPI The pt comes in today for f/u of her dyspnea.  This is felt to be due to some type of NM disease, and she has been on bilevel at night successfully as a respiratory assist device.  She feels she sleeps much better, and is more rested.  However, it has not made a difference with her symptoms during the day .  She is having no issue with mask fit or pressure currently    Review of Systems  Constitutional: Negative for fever and unexpected weight change.  HENT: Positive for rhinorrhea, sneezing and dental problem. Negative for ear pain, nosebleeds, congestion, sore throat, trouble swallowing, postnasal drip and sinus pressure.   Eyes: Negative for redness and itching.  Respiratory: Positive for chest tightness and shortness of breath. Negative for cough and wheezing.   Cardiovascular: Positive for leg swelling. Negative for palpitations.  Gastrointestinal: Negative for nausea and vomiting.  Genitourinary: Negative for dysuria.  Musculoskeletal: Negative for joint swelling.  Skin: Negative for rash.  Neurological: Negative for headaches.  Hematological: Does not bruise/bleed easily.  Psychiatric/Behavioral: Negative for dysphoric mood. The patient is not nervous/anxious.        Objective:   Physical Exam Ow female in nad No skin breakdown or pressure necrosis from cpap mask LE without significant edema, no cyanosis Alert, oriented, moves all 4        Assessment & Plan:

## 2010-08-21 ENCOUNTER — Telehealth: Payer: Self-pay | Admitting: Cardiology

## 2010-08-21 NOTE — Telephone Encounter (Signed)
Pt calling re her blood pressure. Pt has question re blood pressure meds. Pt states her blood pressure goes up and down.

## 2010-08-21 NOTE — Telephone Encounter (Signed)
Pt states her BP is going up and down frequently.  Last night it was 153/78 when she took it and only 84/48 this am.  She states this happens quite often where her BP runs 91/42  93 50 in the AM but then will be high in the pm. Reviewed medications with pt and she is taking them as prescribed.  Cozaar 25 mg BID, Carvediolol 3.125 mg BID, Furosemide 40 mg BID.  She also takes Valium 5 mg at bedtime and Hydrocodone-Acetaminophen at bedtime for sleep problems she has due to chronic pain.  Pt states she feels very bad when her BP go to extremes and has to go back to bed.  Pt aware I will discuss with Dr Antoine Poche and call her back with changes

## 2010-08-23 NOTE — Assessment & Plan Note (Signed)
The pt is doing well with bilevel, and feels it continues to help her sleep.  She is having no mask or pressure issues.  I have asked her to keep up with mask changes and supplies, and to f/u with me in one year.

## 2010-08-23 NOTE — Telephone Encounter (Signed)
Pt aware to take 50 mg Cozaar in the AM and none in the pm.  She will continue to monitor her BP thru the weekend.

## 2010-08-23 NOTE — Telephone Encounter (Signed)
We will ask her to start taking the 50mg  Cozaar all in the am and none in the PM.

## 2010-08-28 ENCOUNTER — Other Ambulatory Visit: Payer: Self-pay | Admitting: Family Medicine

## 2010-08-28 DIAGNOSIS — Z1231 Encounter for screening mammogram for malignant neoplasm of breast: Secondary | ICD-10-CM

## 2010-08-28 NOTE — Telephone Encounter (Signed)
Patient states this morning she felt like she was going to past out. She tried to take her B/P with her regular machine , but she was unable to get it. She was able to  get 100/60 with the CHF program machine. Then at about 9:30 AM again she felt lightheaded like she was going to passed out. Her  B/P was 64/41. She went to bed and fell asleep. When she got up  About 2 hours later, she felt better. Her B/P now is 118/66.  Pt. Would like to know why her B/P goes up and down. Patient thinks that the B/P medication dose is too much for her. Pt. Is taken the Cozaar 50 mg in the AM only as recommended.

## 2010-08-28 NOTE — Telephone Encounter (Signed)
Per pt calling ,C/O blood pressure issues.lightheadedness, weak.   100/60 taken by machine. Morning meds was taken.

## 2010-08-28 NOTE — Telephone Encounter (Signed)
OK to go back to Cozaar 25 mg daily.  This can be take at night.  Keep the BP diary.

## 2010-08-29 NOTE — Telephone Encounter (Signed)
Pt is aware to take 25 mg at bedtime only.  BP today was better because she had not taken her medication since yesterday.  She will restart tonight and follow her BP at home.  She will let us know if she has any problems with it being too low on the decreased amount.

## 2010-08-30 ENCOUNTER — Telehealth: Payer: Self-pay | Admitting: Cardiology

## 2010-08-30 NOTE — Telephone Encounter (Signed)
Pt having problem with bp-pls call

## 2010-08-30 NOTE — Telephone Encounter (Signed)
Per Dr. Shirlee Latch (DOD) recommendation pt will stop Cozaar for now.  She will continue to keep a bp log.  Pt and I also discussed spacing  her coreg and furosemide during the day.  Pt will call back if she has further problems. Mylo Red RN

## 2010-08-30 NOTE — Telephone Encounter (Signed)
Pt calls today with low blood pressure and feeling lightheaded.  Pt restarted Cozaar 25mg  last pm. This am her blood pressure was 118/76.  She felt better and took her furosemide, neurontin, coreg, and zanaflex.  By 10am  She was not feeling well.  Her bp was 72/47.  She laid down for awhile.  She calls at 3:10  And her bp is 127/68 with hr 66.  She is feeling much better. Pt wants to know what she should do about her medications and her bp dropping.  This has been going on since 4/19 according to pt.  She is keeping a bp log. I will send to DOD for review and return call to pt. Mylo Red RN

## 2010-09-10 NOTE — Discharge Summary (Signed)
NAMEJANNIE, Heather Bullock NO.:  [id number]   MEDICAL RECORD NO.:  [id number]          PATIENT TYPE:  INP   LOCATION:  3709                         FACILITY:  [facility]   PHYSICIAN:  Duke Salvia, MD, FACCDATE OF BIRTH:  14-Jul-1928   DATE OF ADMISSION:  11/29/2008  DATE OF DISCHARGE:  11/30/2008                               DISCHARGE SUMMARY   TIME FOR THIS DICTATION AND PREPARATION:  Greater than 45 minutes.   She has allergies to REGLAN, NEOSPORIN, and now CODEINE since Tylenol 3  gave her nausea.   FINAL DIAGNOSIS:  Discharging day 1, status post implant of a St. Jude  Unify biventricular implantable cardioverter-defibrillator.  The patient  did not have a defibrillator threshold study.   SECONDARY DIAGNOSES:  1. Nonischemic cardiomyopathy, ejection fraction 25-30% by      echocardiogram.      a.     New York Heart Association class IIB - III congestive heart       failure.  2. Left bundle-branch block.  3. Dyslipidemia.  4. Childhood asthma.  5. Glaucoma, now in remission after cataract surgery.  6. Hypertension.  7. Status post right total knee arthroplasty, hysterectomy,      appendectomy.   PROCEDURE:  On November 29, 2008, implant of the [place]. Jude Unify system, Dr.  Sherryl Manges.  X-ray shows that the leads are in appropriate position,  and there is no pneumothorax.  The device has been interrogated within  normal limits.   BRIEF HISTORY:  [name] is a 77 year old female.  She is referred for  consultation by Dr. Katrinka Blazing.  The patient has a nonischemic cardiomyopathy  and congestive heart failure which is impacting her lifestyle.   She was initially diagnosed with a cardiomyopathy about 12 years ago at  catheterization.  She has remained notably nonischemic.  Repeat  catheterizations showed no obstructive coronary artery disease, ejection  fraction 40-50%.   Over the last year, she has begun developing progressive symptoms of  shortness of breath  and peripheral edema.  Most recent ejection fraction  by echocardiogram was 20-30%.  The patient is on beta-blockers and ACE  inhibitors.  She has some improvement in her symptoms; however, she is  unable to walk more than 100 feet.  The patient has peripheral edema,  nocturnal dyspnea, 3- to 4-pillow orthopnea, and electrocardiogram  showed left bundle-branch block.  The patient has no history of syncope.   ASSESSMENT:  The patient has long-standing history of cardiomyopathy  which dates back to 12 years and indication for ICD shows that the  patient has class III congestive heart failure, nonischemic  cardiomyopathy.  Her uptitration of medication is limited by hypotension  and bradycardia.  The patient will have elective placement of a BiV ICD   HOSPITAL COURSE:  The patient presents electively on November 29, 2008.  She underwent implantation of the St. Jude Unify device with Dr. Sherryl Manges.  Once again, all 3 leads are in appropriate position by followup  chest x-ray.  The patient has moderate discomfort at the incision site.  She has had some nausea on Tylenol No. 3 in the postprocedure period.  This has now resolved with Zofran and Maalox.  The patient unfortunately  took Tylenol No. 3 her second dose in 6 hours on an empty stomach.  The  device has been interrogated postprocedure day #1, all values within  normal limits.  She will see Dr[facility] Graciela [facility] on Thursday,  December 14, 2008, at 10 o'clock, also once again on March 02, 2009, at  11 o'clock.  She will have pain medication given at discharge for  discomfort for a short period.   MEDICATIONS AT DISCHARGE:  Bacitracin ointment, apply topically for a  tape abrasion with covering of a 2 x 2 gauze, also new medication  Darvocet-N 100 one to two tablets every 6 hours as needed, this should  avoid nausea.  She will continue her regular medications which are as  follows:  1. Aspirin 81 mg daily.  2. Dulcolax 5 mg  daily.  3. Calcium and vitamin D 1 tablet daily.  4. Furosemide 40 mg daily.  5. Gabapentin 300 mg twice daily.  6. Hydrochlorothiazide 25 mg one-half tab daily.  7. The patient has Vicodin 1 tab daily at bedtime as needed.  8. Lisinopril 20 mg daily.  9. Metoprolol succinate 25 mg 1 tablet daily.  10.Mobic 15 mg daily at noon.  11.Multivitamin daily.  12.Prevacid 15 mg daily after dinner.  13.Valium 5 mg daily at bedtime as needed.  14.Simvastatin 40 mg daily at bedtime.  15.Vitamin C 500 mg 2 tablets daily.  16.Vitamin D two 50,000 units weekly.   The patient is asked to hold off on taking travoprost 0.004% ophthalmic  solution.  She had been on this for glaucoma caused by cataracts.  Now  that she has had cataract resection, she no longer needs to take this.   Laboratory studies pertinent to this admission drawn on November 22, 2008,  sodium 140, potassium 4.3, chloride 103, carbonate 31, glucose 120, BUN  is 22, creatinine 1.1.  white cells 6.0, hemoglobin 12.3, hematocrit  35.6, platelets 255, protime 10.9, INR is 1.0.      Maple Mirza, [place]      Duke Salvia, MD, [facility]  Electronically Signed    GM/MEDQ  D:  11/30/2008  T:  11/30/2008  Job:  [id number]   cc:   Lyn Records, M.D.  Bryan Lemma. Manus Gunning, M.D.

## 2010-09-10 NOTE — Op Note (Signed)
NAMESHALESE, STRAHAN NO.:  [id number]   MEDICAL RECORD NO.:  [id number]          PATIENT TYPE:  INP   LOCATION:  3009                         FACILITY:  [facility]   PHYSICIAN:  Cristi Loron, M.D.DATE OF BIRTH:  [birth year]   DATE OF PROCEDURE:  03/15/2007  DATE OF DISCHARGE:                               OPERATIVE REPORT   BRIEF HISTORY:  The patient is a 77 year old white female on whom I  performed a C4-C5 and C5-C6 anterior cervical discectomy with fusion and  plating last week.  Postoperatively, the patient had weakness in the  left deltoid and biceps consistent with left C5 neurapraxia.  It seemed  to slowly improve.  This was worked up further with a cervical CT which  demonstrated the patient had some residual foraminal stenosis at C4-C5  on the right.  This stenosis compared to a preoperative MRI had improved  with surgery.  We followed this up with a cervical MRI that made sure  there was no evidence of spinal cord contusion, hematoma, etc., and the  cervical MRI demonstrated no such findings but did demonstrate some  residual neural foramen stenosis.  I discussed this fully with the  patient and her son.  I told her that I had a doubt whether this  stenosis was the cause of her neurapraxia but in order to eliminate the  possibility that this stenosis was causing her the weakness, I elected  we explore her C4-C5 fusion and remove the right uncovertebral joint to  make more room for the right C5 nerve.  I explained this procedure; the  risks, benefits, and alternatives.  I also discussed the alternative  treatment option of observation but I did tell her that if we waited and  did this foraminotomy at a later date, the surgery would be more  difficult, i.e., if the fusion is solid, the surgery is more extensive.  The patient has weighed the risks, benefits, and alternatives of surgery  and decided to proceed with the operation.  All questions were  answered.   PREOPERATIVE DIAGNOSES:  Right C4-C5 foraminal stenosis, right C5  neurapraxia.   POSTOPERATIVE DIAGNOSES:  Right C4-C5 foraminal stenosis, right C5  neurapraxia.   PROCEDURE:  Exploration of C4-C5 fusion and right C4-C5 foraminotomy,  removal of right uncovertebral joint.   SURGEON:  Cristi Loron, MD   ASSISTANT:  Clydene Fake, MD   ANESTHESIA:  General endotracheal.   ESTIMATED BLOOD LOSS:  50 mL.   SPECIMENS:  None.   DRAINS:  None.   COMPLICATIONS:  None.   DESCRIPTION OF PROCEDURE:  The patient was brought to the operating room  by the anesthesia team.  General endotracheal anesthesia was induced.  The patient remained in the supine position.  A roll was placed under  her shoulders placing the neck in slight extension.  Her anterior  cervical region was then prepared with Betadine scrub and Betadine  solution.  Sterile drapes were applied.  I then used a #15 blade scalpel  to incise the previous surgical incision.  We then carefully  dissected  down towards the anterior cervical spine; medial to sternocleidomastoid  muscle, jugular vein, and carotid artery.  I encountered a small  prevertebral liquefied hematoma, was drained with suction.  I then used  a hand-held retractors to retract the esophagus medially exposing the  anterior cervical plate.  I locked the cams, removed the screws, and  removed the plate and then placed distraction screws at C4 and C5 to  distract the interspace and then removed the interbody prosthesis.  I  then brought the operative microscope into the field and under its  magnification and illumination completed the decompression.  I inspected  the right C5 nerve root.  The nerve root appeared to be decompressed.  I  could easily run a black nerve hook out for the neuroforamen without any  obvious stenosis.  I was underwhelmed about the degree of foraminal  stenosis.  In any event, I used the high-speed drill to drill out  some  more spondylosis laterally to detach the uncovertebral joint and the  spondylosis more laterally, and then I used the Kerrison punch and then  a nerve hook to remove the uncovertebral joint which was from the caudal  lateral edge of the right C4.  At this point, the foramen was wide open.  Again, I carefully palpated along the C5 nerve root to make sure there  was no residual neural compression and satisfied with the decompression.  I irrigated the wound out with bacitracin solution, achieved hemostasis  with bipolar electrocautery.  I then decided to place a larger spacer in  the interspace.  I used a 7-mm small PEEK interbody prosthesis.  We  prefilled this with VITOSS bone graft substitute and placed into the  distracted interspace and removed the distraction screws.  There was a  good snug fit of prosthesis in the interspace.  We then filled laterally  in the disk space with VITOSS.  We then replaced the patient's anterior  cervical plate.  We placed five 12-mm rescue screws.  We did not have  six available to [place] presently, and then placed one of the old screws in  the remaining preexisting holes at C4, C5, and C6.  There was good bony  purchase.  I then secured the screws by locking each cam.  We obtained a  lateral radiograph, which demonstrated good positioning of plate,  screws, and interbody prosthesis at C4 down through C6.  I then obtained  hemostasis using bipolar cautery.  Irrigated the wound out with  bacitracin solution, removed the retractor.  I inspected the esophagus  for any damage; there was none apparent.  We then reapproximated the  patient's platysma muscle with interrupted 3-0 Vicryl suture,  subcutaneous tissue with interrupted 3-0 Vicryl suture, and the skin  with Steri-Strips and Benzoin.  We then placed a sterile dressing over  the wound and then removed the drapes.  The patient was subsequently  extubated by the anesthesia team and transported to  post-anesthesia care  unit in stable condition.  All sponge, instruments, and needle counts  were correct at the end of this case.      Cristi Loron, M.D.  Electronically Signed     JDJ/MEDQ  D:  03/16/2007  T:  03/17/2007  Job:  [id number]

## 2010-09-10 NOTE — Op Note (Signed)
NAMEJOHNNAE, IMPASTATO NO.:  [id number]   MEDICAL RECORD NO.:  [id number]          PATIENT TYPE:  INP   LOCATION:  3709                         FACILITY:  [facility]   PHYSICIAN:  Duke Salvia, MD, FACCDATE OF BIRTH:  December 22, 1928   DATE OF PROCEDURE:  11/29/2008  DATE OF DISCHARGE:                               OPERATIVE REPORT   PREOPERATIVE DIAGNOSES:  Congestive heart failure, nonischemic  cardiomyopathy, and left bundle-branch block.   POSTOPERATIVE DIAGNOSES:  Congestive heart failure, nonischemic  cardiomyopathy, and left bundle-branch block.   PROCEDURE:  Dual-chamber defibrillator implantation with left  ventricular lead placement and high-voltage testing.   DESCRIPTION OF PROCEDURE:  Following obtaining informed consent, the  patient was brought to the electrophysiology laboratory and placed on  the fluoroscopic table in the supine position.  After routine prep and  drape of the left upper chest, lidocaine was infiltrated in the  prepectoral subclavicular region.  An incision was made and carried down  to layer of the prepectoral fascia with electrocautery and sharp  dissection.  A pocket was formed similarly.  Hemostasis was obtained.   Thereafter, attention was turned to gain the access to extrathoracic  left subclavian vein, which was accomplished without difficulty and  without the aspiration of air or puncture of the artery.  Three separate  venipunctures were accomplished.  Guidewires were placed and retained.  Sequentially, an 8-French, 9.5-French, and 7-French sheath were placed  through which were passed respectively a St. Jude Durata [id number] 58-cm  dual coil active-fixation defibrillator lead, serial number [id number], a  Medtronic MB2 coronary sinus cannulation catheter and a St. Jude [id number]  46-cm active-fixation atrial lead, serial number [id number].  Under  fluoroscopic guidance, the RV lead was manipulated to the right  ventricular apex where  the bipolar R wave was 11.4 with a pace impedance  of 885 ohms, threshold of 0.6 V at 0.5 msec.  Current threshold was  appropriate, and there was no diaphragmatic pacing at 10 V.  The current  of injury was brisk.  This lead was secured to the prepectoral fascia.   Through the MB2 cannulation catheter, a nonocclusive venogram was  accomplished demonstrating a mid lateral branch.  This was targeted with  a whisper wire successfully.  An 1158T St. Jude QuickFlex lead, serial  number [id number] was manipulated to a junction between the mid and distal  third.  In this location, the bipolar L wave was 5.8 with a pace  impedance of 936 ohms and threshold 1.4 V at 0.5 msec.  There was no  diaphragmatic pacing at 10 V.  At this point, the 9.5-French sheath was  left in place.  An atrial lead was manipulated to the right atrial  appendage where the bipolar P wave was 3.7 with a pace impedance of 1126  ohms, threshold was initially high at 2.8 V.  Current was okay, and  current of injury was brisk, and there was no diaphragmatic pacing at 10  V.  This lead was secured to the prepectoral fascia, and then the LV  deployment system was  removed under fluoroscopy without displacement of  the LV lead.   The pocket was then copiously irrigated with antibiotic-containing  saline solution.  Hemostasis was assured, and Vitasure was applied to  the pocket.  The leads were attached to a St. Jude Unify pulse  generator, serial number [id number], model [id number] - 40Q.  Through the  device, the bipolar P wave was 3 with a pace impedance of 1150 ohms, and  threshold of 1 V.  The R wave was 12 with a pace impedance of 600 and  threshold of 0.5, and the LV impedance was 945 with threshold 1.5 V.   Because of the patient's class IIIB and Aaren heart failure, high-voltage  testing was done only in a synchronized mode and it was measured at 42  ohms.  At this point, the device was implanted.  The hemostasis was  assured.   The leads and the pulse generator were placed in the pocket  and secured to the prepectoral fascia, and the wound was closed in 3  layers in a normal fashion.  The wound was washed, dried, and a benzoin,  Steri-Strips dressing was applied.  Needle count, sponge counts, and  instrument counts were correct at the end of the procedure according to  the staff.  The patient tolerated the procedure without apparent  complication.      Duke Salvia, MD, [facility]  Electronically Signed     SCK/MEDQ  D:  11/29/2008  T:  11/29/2008  Job:  [id number]

## 2010-09-10 NOTE — Op Note (Signed)
Heather Bullock, SZATKOWSKI NO.:  [id number]   MEDICAL RECORD NO.:  [id number]          PATIENT TYPE:  INP   LOCATION:  3009                         FACILITY:  [facility]   PHYSICIAN:  Cristi Loron, M.D.DATE OF BIRTH:  1928-06-27   DATE OF PROCEDURE:  03/10/2007  DATE OF DISCHARGE:                               OPERATIVE REPORT   BRIEF HISTORY:  The patient is 77 year old white female who suffered  from neck and arm pain.  She failed medical management, was worked up  with lumbar MRI and a lumbar myelo-CT, which demonstrated that the  patient has spondylosis stenosis at C4-C5 and C5-C6.  I discussed the  various treatments options including surgery.  The patient has weighed  the risks, benefits, and alternatives of surgery and proceed with C4-C5  and C5-C6 anterior cervical discectomy and fusion with plating.   PREOPERATIVE DIAGNOSIS:  C4-C5 and C5-C6 extensive anterior cervical  diskectomy/decompression; insertion of C4-C5, C5-C6, interbody  prosthesis (Alphatec PEEK interbody prosthesis); C4-C5 and C5-C6  anterior interbody arthrodesis with local morselized autograft bone and  VITOSS bone-graft extender; anterior cervical instrumentation C4-C6 with  Codman Slim-Loc titanium plate and screws.   SURGEON:  Cristi Loron, M.D.   ASSISTANT:  Stefani Dama, M.D.   ANESTHESIA:  General endotracheal.   ESTIMATED BLOOD LOSS:  75 mL.   SPECIMENS:  None.   DRAINS:  None.   COMPLICATIONS:  None.   PROCEDURE:  The patient was brought to the operating room by anesthesia  team.  General endotracheal anesthesia was induced.  The patient  remained in supine position.  A roll was placed under shoulders to place  her neck in a slight extension.  Her anterior cervical region was then  prepared with Betadine scrub and Betadine solution.  Sterile drapes were  applied.  I then injected the area to incise with Marcaine with  epinephrine solution.  I used a scalpel to  make a transverse incision in  the patient's left anterior neck.  I used the Metzenbaum scissors to  divide the platysma muscle and then to dissect medial to  sternocleidomastoid muscle, jugular vein, and carotid artery.  I  carefully dissected down towards the anterior cervical spine and  identified the esophagus, retracting it medially.  We then used Kitner  swabs to clear soft tissue from the anterior cervical spine and inserted  a bent spinal needle into the upper exposed intervertebral disk space.  We obtained an intraoperative radiograph to confirm our location.   We then used electrocautery to detach the medial border of the longus  colli muscle bilaterally from C5-C6 and C6-C7 intervertebral disk space.  We inserted the Caspar self-retaining retractor underneath the longus  colli muscle bilaterally to provide exposure.  We began at C4-C5.  We  incised C4-C5 intervertebral disk with 15-blade scalpel.  This disk  space was quite spondylotic.  We then inserted distraction screws at C5  and C4, distracted the interspace and then used a high-speed drill to  decorticate the vertebral endplates at C4-C5, drilled away the remainder  C4-C5 intervertebral disk  to drill away some posterior spondylosis and  to thin out the posterior longitudinal ligament.  We then incised the  ligament with arachnoid knife and removed it with Kerrison punch  undercutting the vertebral endplates at C4-C5 decompressing the thecal  sac.  We then performed a foraminotomy about C5 nerve root completing  the decompression at this level.   We then repeated this procedure in analogous fashion at C5-C6,  decompressing thecal sac, and bilateral C6 nerve roots.   Having completed the decompression at C4-C5 and C5-C6, we now turned  attention to arthrodesis.  We used trial spacers, determined to use  small 5-mm interbody Alphatec PEEK prosthesis.  We prefilled these  prosthesis with local autograft bone in VITOSS and  then we inserted the  prosthesis into the distracted interspaces at C4-C5 and C5-C6.  We  removed distraction screws.  There was a good snug fit of the prosthesis  at both levels.  We then filled lateral to the prosthesis with VITOSS  and local autograft bone completing the interbody fusion..   We now turned attention to anterior spinal fixation.  We used a high-  speed drill to remove some ventral spondylosis from the C4-C5, C5-C6  vertebral endplates, so that the plate lay down flat.  We selected the  appropriate length Codman Slim-Loc titanium plate and laid along the  anterior aspect of the vertebral bodies of C4-C6.  We then drilled two  12-mm holes at C4, C5, and C6 and secured the plate to the vertebral  bodies by placing two 12-mm self-tapping screws at C4, C5, and C6.  We  obtained intraoperative radiograph that demonstrated good position of  plate screws by prosthesis with limited visualization of the lower plate  screws that looked good in vivo.  We, therefore, secured the screws and  plate by locking each cam.  This completed the instrumentation.   We then irrigated the wound out with bacitracin solution.  We obtained  hemostasis using bipolar cautery.  We then removed the Caspar self-  retaining retractor, inspected esophagus for any damage, was not  apparent.  We then reapproximated the patient's platysma muscle with  interrupted 3-0 Vicryl suture, subcutaneous tissue with interrupted 3-0  Vicryl suture, and the skin with Steri-Strips and Benzoin.  A sterile  dressing was applied.  The drapes were removed and the patient was  subsequently extubated by the anesthesia team and transported to post  anesthesia care unit in stable condition.  All sponge, instrument, and  needle counts were correct in this case.      Cristi Loron, M.D.  Electronically Signed     JDJ/MEDQ  D:  03/10/2007  T:  03/11/2007  Job:  [id number]

## 2010-09-11 ENCOUNTER — Encounter: Payer: Self-pay | Admitting: Cardiology

## 2010-09-13 NOTE — Discharge Summary (Signed)
NAMEMENNA, ABELN NO.:  [id number]   MEDICAL RECORD NO.:  [id number]          PATIENT TYPE:  ORB   LOCATION:  4525                         FACILITY:  [facility]   PHYSICIAN:  Ellwood Dense, M.D.   DATE OF BIRTH:  [birth year]   DATE OF ADMISSION:  05/24/2004  DATE OF DISCHARGE:                                 DISCHARGE SUMMARY   Initial dictation [phone].   ADDENDUM:  To enable the patient to complete her full family teaching and  gain independence that she needed to be in a room for discharge home,  discharge was delayed 1 day until 05/31/04 during that stay and she remained  medically stable.  She continued on her Coumadin therapy, arrangements had  been made with the [facility]  to complete Coumadin protocol.  Her surgical  site was healing nicely with anticipation to follow up with Dr. Jerl Santos in  1 week.      DA/MEDQ  D:  05/30/2004  T:  05/30/2004  Job:  [id number]   cc:   Lubertha Basque. Jerl Santos, M.D.  [address]  [place]  [place] [address]  Fax: [phone]   Bryan Lemma. Manus Gunning, M.D.  [address] [place]  [place] [address]  Fax: [phone]

## 2010-09-13 NOTE — Op Note (Signed)
[name], Heather Bullock NO.:  [id number]   MEDICAL RECORD NO.:  [id number]                   PATIENT TYPE:  OIB   LOCATION:  2853                                 FACILITY:  [facility]   PHYSICIAN:  Lubertha Basque. Jerl Santos, M.D.             DATE OF BIRTH:  1928/08/18   DATE OF PROCEDURE:  02/23/2003  DATE OF DISCHARGE:  02/23/2003                                 OPERATIVE REPORT   PREOPERATIVE DIAGNOSES:  1. Right knee torn medial meniscus.  2. Right knee degenerative joint disease.   POSTOPERATIVE DIAGNOSES:  1. Right knee torn medial meniscus.  2. Right knee degenerative joint disease.   PROCEDURE:  1. Right knee partial medial meniscectomy.  2. Right knee chondroplasty and abrasion arthroplasty.   ATTENDING SURGEON:  Lubertha Basque. Jerl Santos, M.D.   ASSISTANT:  Lindwood Qua, P.A.   ANESTHESIA:  General.   INDICATION FOR PROCEDURE:  The patient is a 77 year old woman with a long  history of right knee pain and swelling.  This has persisted despite oral  anti-inflammatories and injection.  She has undergone a scan which does show  some medial meniscus tears along with some mild degenerative changes.  She  has been left with disabling pain at activity and at rest and is offered an  arthroscopy.  Informed operative consent was obtained after discussion of  possible complications of reaction to anesthesia and infection.  She had  some medical issues which initially canceled her case on a day surgery basis  and she was subsequently scheduled for the case in the main operating at the  request of the anesthesiologist.   DESCRIPTION OF PROCEDURE:  The patient was taken to the operating suite  where a general anesthetic was applied without difficulty.  She was  positioned supine and prepped and draped in normal sterile fashion.  After  the administration of brief IV antibiotics, an arthroscopy of the right knee  was performed through two inferior portals.   Suprapatellar pouch was benign,  while the patellofemoral joint did exhibit some grade 4 change on both  surfaces.  A thorough chondroplasty was done, along with a brief abrasion.  In the medial compartment, she had some grade 3 changes with no bare bone  exposed.  She did have a degenerative radial tear over the posterior horn of  the medial meniscus.  With some moderate difficulty, we performed about a  10% or 15% partial medial meniscectomy, removing unstable tissues back to a  stable rim.  The lateral compartment was completely benign.  The ACL  appeared to be intact.  The knee was clearly irrigated at the end of the  case, followed by placement of Marcaine with epinephrine and morphine plus  Depo-Medrol.  Estimated blood loss and intraoperative fluids can be obtained  anesthesia records.  At the end of the case, Adaptic was applied, followed  by dry gauze  and a loose Ace wrap.    DISPOSITION:  The patient was extubated in the operating room and taken to  the recovery room in stable condition.  Plans were for her to go home the  same day and to follow up in the office in one week.  I will contact her by  phone tonight.                                                Lubertha Basque Jerl Santos, M.D.    PGD/MEDQ  D:  02/23/2003  T:  02/23/2003  Job:  [id number]

## 2010-09-13 NOTE — Assessment & Plan Note (Signed)
[facility]                               PULMONARY OFFICE NOTE   Heather, Bullock                           MRN:          [id number]  DATE:03/09/2006                            DOB:          08-25-1928    SUBJECTIVE:  Heather Bullock comes in today for followup of her significant  dyspnea on exertion.  The patient at the last visit was tried on Symbicort  for the possibility of reactive airway disease related to bronchial wall  thickening and very mild bronchiectasis.  The patient saw no difference in  her breathing at all and, therefore, has discontinued it.  In the meantime,  she has seen Dr. Nash Shearer from neurology to try and rule out a neuromuscular  disease which may be contributing to her shortness of breath.  Her PFTs  showed very mild restriction that seemed out of proportion to her degree of  dyspnea.  The patient has continued to have a fairly weak voice and  hoarseness over the last few months, but complains of quite a bit of reflux  and epigastric fullness.  She has been on Prevacid in the past for this but  is not taking it currently.  I have asked her to discuss this with her  primary care doctor.   PHYSICAL EXAMINATION:  VITAL SIGNS:  Blood pressure is 120/74, pulse 52,  temperature 97.4, weight 178 pounds.  Oxygen saturation on room air is 97%.   IMPRESSION:  Significant dyspnea that is interfering with her activities of  daily living.  It continues to be out of proportion to her objective  findings so far.  I can really find nothing from a pulmonary standpoint to  explain her symptomatology.  I do think it is a good idea to have neurology  follow to make sure that this is a neuromuscular disease; however, I  typically do not see these kind of symptoms from this until her total lung  capacity is significantly reduced.  The only other recommendation that I may  have is to get ENT to look at her vocal cords and upper airway to make sure  there is not an abnormality there that could be increasing her resistance to  airflow even at rest.  The other possibility is to consider a right heart  catheterization that would help put the issue to rest in terms of her  filling pressures and whether or not congestive heart failure could be  contributing to this.  It should be noted that the patient's echo has been  stable and her B natriuretic peptide has been at a fairly low level.  I will  discuss this with Dr. Katrinka Blazing.   PLAN:  1. Discontinue Symbicort.  2. Consider right heart catheterization for PA pressures and also left      heart filling pressures.  3. Would recommend an ENT evaluation of her upper airway at some point if      the MRI is unremarkable as is the right heart catheterization.  4. The patient will follow up with me on a p.r.n.  basis but I will      continue remain closely interested in this case to see if there is      anything else that I can add.    ______________________________  Barbaraann Share, MD,FCCP    [facility]/MedQ  DD: 03/09/2006  DT: 03/09/2006  Job #: [id number]   cc:   Lyn Records, M.D.  Bryan Lemma. Manus Gunning, M.D.  Bevelyn Buckles. Nash Shearer, M.D.

## 2010-09-13 NOTE — Discharge Summary (Signed)
NAMEBERENIZE, GATLIN NO.:  [id number]   MEDICAL RECORD NO.:  [id number]          PATIENT TYPE:  INP   LOCATION:  3009                         FACILITY:  [facility]   PHYSICIAN:  Cristi Loron, M.D.DATE OF BIRTH:  06/24/1928   DATE OF ADMISSION:  03/10/2007  DATE OF DISCHARGE:  03/19/2007                               DISCHARGE SUMMARY   BRIEF HISTORY:  The patient is a 77 year old white female who has  suffered some neck and arm pain consistent with a cervical  radiculopathy.  She failed medical management.  She worked up with  cervical MRI and a myelo-CT demonstrates spondylosis that she has had at  C4-5, C5-6.  Her symptoms seemed consistent with a C5 and C6  radiculopathy.  I have discussed various treatment options with the  patient including surgery.  The patient has weighed the risks, benefits,  and alternatives of the surgery and wants to proceed with a C4-5 and C5-  6 anterior cervical discectomy and fusion with plate.   For further details of this admission, please refer to the typed history  and physical.   HOSPITAL COURSE:  I admitted the patient to [facility] on  March 10, 2007.  On the day of admission, I performed a C4-5 and C5-6  anterior cervical discectomy and fusion with plate placement of  interbody prosthesis.  The surgery went well (for full details of that  operation, please refer to typed operative note).   POSTOPERATIVE COURSE:  The patient's postoperative course was  remarkable.  She awoke with a right C5 and partial C6 neurapraxia.  I  recommended observation.  She seemed to improve over the next couple of  days, but continued to have significant C5 deltoid weakness.  She was  then worked up over the weekend by Dr. Franky Macho with a neck CT to confirm  hardware location.  The CT demonstrated the patient to still have some  foraminal stenosis at C4-5 on the right, once again to bone  uncovertebral joints.   I discussed with  the patient.  I told her I was not convinced that there  was significant neural compression, as we had seen the C5 nerve root  with our own eye a few days earlier, but in order to eliminate this as  possible cause of her C5 neurapraxia, I recommended she consider  exploration of her fusion and removal of the right uncovertebral joint  to further decompress the C5 nerve root.  I explained to her that I  could not be sure that this would be helpful, but it did however seem a  reasonable thing do.  The patient was aware of the risks, benefits, and  alternatives of the surgery and proceed with operation.  I performed  exploration of C4-5 fusion with removal of the right uncovertebral joint  on March 15, 2007.  The surgery went well (for full details of this  operation, please refer to typed operative note).   POSTOPERATIVE COURSE:  The patient's postoperative course was  unremarkable.  We had PT/OT continue to see the patient.  We also had  PMR see the patient.  They did not think she needed inpatient  rehabilitation.  By March 19, 2007, the patient was afebrile.  Vital  signs stable.  She was eating well, ambulating well, and the wound was  healing well without signs of infection and at that point, she requested  to be discharged to home.  We made arrangements for home physical  therapy and for follow up with me.  She was therefore discharge home on  March 19, 2007.   FINAL DIAGNOSES:  C4-5, C5-6 spondylosis and stenosis, cervicalgia, and  cervical radiculopathy.   PROCEDURE PERFORMED:  C4-5 and C5-6 anterior cervical discectomy, fusion  plating on March 19, 2007, and exploration of her fusion on March 15, 2007, as above.   DISCHARGE PRESCRIPTIONS:  1. Percocet 10/325, #100, one p.o. q. 4h. p.r.n. for pain.  2. Valium 5 mg, #50 one p.o. q. 8h. p.r.n. for muscle spasms.      Cristi Loron, M.D.  Electronically Signed     JDJ/MEDQ  D:  04/12/2007  T:   04/13/2007  Job:  [id number]

## 2010-09-13 NOTE — Procedures (Signed)
[facility]  Patient:    Heather Bullock, Heather Bullock                           MRN: [id number] Proc. Date: 12/01/00 Adm. Date:  [id number] Attending:  Louie Bun                           Procedure Report  PROCEDURE:  Esophagogastroduodenoscopy with biopsy.  INDICATIONS FOR PROCEDURE:  Patient undergoing colonoscopy for heme positive stools and small volume fecal incontinence and has also had epigastric pain partially relieved by Aciphex.  DESCRIPTION OF PROCEDURE:  The patient was placed in the left lateral decubitus position then placed on the pulse monitor with continuous low flow oxygen delivered by nasal cannula. She was sedated with 40 mg IV Demerol and 4 mg IV Versed. The Olympus video endoscope was advanced under direct vision into the oropharynx and esophagus. The esophagus was straight and of normal caliber at the squamocolumnar line at 38 cm. It was slightly irregular, consistent with grade 1 esophagitis with no discreet areas of exudate or ulceration with no visible stricture or ring and no hiatal hernia appreciated. The stomach was entered and a small amount of liquid secretions were suctioned from the fundus. Retroflexed view of the cardia was unremarkable. The fundus and body appeared normal. The antrum showed some elevated folds that radiated down toward the pylorus and overlying one of them was a roundish area that could possibly have represented a true polyp or simply an extension of the elevated fold. That area was biopsied and a CLOtest obtained. The pylorus was nondeformed and easily allowed passage of the endoscope tip into the duodenum. Both the bulb and second portion were well inspected and appeared to be within normal limits. The scope was then withdrawn and the patient returned to the recovery room in stable condition. The patient tolerated the procedure well and there were no immediate complications.  IMPRESSION: 1. Elevated gastric  folds with possible gastric polyp. 2. Minimal esophagitis.  PLAN:  Will continue Aciphex for now. Await biopsies and CLOtest.DD:  12/01/00 TD:  12/02/00 Job: [id number]

## 2010-09-13 NOTE — Discharge Summary (Signed)
Heather Bullock, PITCHER NO.:  [id number]   MEDICAL RECORD NO.:  [id number]          PATIENT TYPE:  ORB   LOCATION:  4525                         FACILITY:  [facility]   PHYSICIAN:  Ellwood Dense, M.D.   DATE OF BIRTH:  02/04/1929   DATE OF ADMISSION:  05/24/2004  DATE OF DISCHARGE:  05/30/2004                                 DISCHARGE SUMMARY   DISCHARGE DIAGNOSES:  1.  Right total knee replacement, 05/21/04 secondary to degenerative joint      disease.  2.  Pain management.  3.  Coumadin for deep vein thrombosis prophylaxis.  4.  Anemia.  5.  Hypertension.  6.  Hyperlipidemia.  7.  Peptic ulcer disease.   A 77 year old white female admitted 05/21/04 with end-stage change of the  right knee and no relief with conservative care.  Underwent a right total  knee replacement on 05/21/04 per Dr. Jerl Santos and placed on Coumadin for deep  vein thrombosis prophylaxis and advised weightbearing as tolerated.  Postoperative anemia 9.3 and monitored, patient Jehovah's witness and no  blood products.  Pain control ongoing, admitted for a comprehensive rehab  program.   PAST MEDICAL HISTORY:  See discharge diagnoses.   ALLERGIES:  NEOMYCIN.   Occasional alcohol, no tobacco.   SOCIAL HISTORY:  Lives alone in [place], one level home, no steps to  entry.  Works part-time as an Film/video editor.  Local family works.   MEDICATIONS PRIOR TO ADMISSION:  1.  Metoprolol.  2.  Vytorin.  3.  Aspirin.  4.  Enalapril.  5.  Hydrochlorothiazide.  6.  Mobic.   HOSPITAL COURSE:  The patient with progressive gains while on rehab services  with therapies initiated daily and the following issues were followed during  patient's rehab course:  Pertaining to Ms. Asfaw's right total knee  replacement, surgical site healing nicely, no signs of infection, she was  supervision for ambulation with home health therapies arranged with [place].  She would follow up with Dr. Jerl Santos of orthopedic  services.  Pain  management ongoing with the use of Vicodin and good results.  She remained  on Coumadin for deep vein thrombosis prophylaxis with latest INR of 2.7, she  would complete Coumadin protocol.  The patient was advised to resume her  aspirin after Coumadin completed.  Postoperative anemia with latest  hemoglobin of 10.2, hematocrit 29.7.  Blood pressures controlled with  Lopressor and Vasotec with no orthostatic changes.  She had a history of  hyperlipidemia and she continued on her Vytorin as prior to hospital  admission.  She had no bowel or bladder disturbances, she was discharged to  home.   DISCHARGE MEDICATIONS:  1.  Coumadin with latest dose of 5 mg to be completed on 06/21/04.  2.  Vytorin daily.  3.  Vasotec 5 mg daily.  4.  Metoprolol 50 mg daily.  5.  Hydrochlorothiazide 25 mg daily.  6.  Potassium chloride 20 mEq daily.  7.  Vicodin p.r.n.   ACTIVITY:  As tolerated.   DIET:  Regular.   SPECIAL INSTRUCTIONS:  Home health  nurse per Genevieve Norlander to check INR Friday,  05/31/04.  She should follow up with Dr. Jerl Santos, orthopedic services as  advised, Dr. Manus Gunning medical management, home health physical and  occupational therapy have also been arranged.      DA/MEDQ  D:  05/29/2004  T:  05/29/2004  Job:  [id number]   cc:   Lubertha Basque. Jerl Santos, M.D.  [address]  [place]  [place] [address]  Fax: [phone]   Bryan Lemma. Manus Gunning, M.D.  [address] [place]  [place] [address]  Fax: [phone]

## 2010-09-13 NOTE — Assessment & Plan Note (Signed)
[facility]                               PULMONARY OFFICE NOTE   ARABEL, Heather Bullock                           MRN:          [id number]  DATE:01/15/2006                            DOB:          09-20-1928    HISTORY OF PRESENT ILLNESS:  Patient is a 77 year old female who comes in  today for evaluation of dyspnea.  The patient has a history of childhood  asthma but has had no difficulty since 77 years of age.  This was related to  a change in her environment.  Patient states that she has had baseline mild  dyspnea on exertion over the years but since August she has had a great  increase in its severity.  Patient states that she will get short of breath  making a bed, bringing groceries in from the car or even vacuuming one room  of her house.  Patient has less than one block of dyspnea on exertion at a  moderate pace on flat ground.  She has significant shortness of breath with  any of her activities of daily living.  Patient has had no significant cough  and denies increasing lower extremity edema.  The patient has had a CBC  which was unremarkable as well as a chest x-ray with no acute findings.  Patient also has been placed on prednisone for approximately 12 days by Dr.  Manus Gunning with no significant change in her symptomatology.  The patient has a  history of a mild cardiomyopathy with an EF of 45% and has been followed by  Dr. Katrinka Blazing.  She had an echo yesterday that apparently was unchanged from her  prior study. Dr. Katrinka Blazing has changed her diuretic regimen and it is unclear to  her at this point in time whether or not she is better.  Of note, the  patient's weight has increased 20 pounds over the last two years.  Patient  has also had pulmonary function studies on December 19, 2005, at [place], where her FEV1 percent was totally normal and therefore  without significant obstructive disease.  Her FVC was mildly reduced, but  without lung volumes it was unclear whether this is due to air trapping or  possibly restriction.  DLCO was not done.   PAST MEDICAL HISTORY:  1. Mild cardiomyopathy which is followed by Dr. Katrinka Blazing.  2. History of childhood asthma.  3. History of dyslipidemia.  4. Status post appendectomy and hysterectomy.  5. History of knee replacement.   CURRENT MEDICATIONS:  1. Benicar 40 mg daily.  2. Mobic 15 mg daily.  3. Vytorin 10/40 daily.  4. Xalatan eye drops daily.  5. Metoprolol 50 mg b.i.d.  6. Xanax 0.5 mg p.r.n.  7. Baby aspirin one daily.  8. Multivitamins.  9. Over-the-counter supplements.   ALLERGIES:  NO KNOWN DRUG ALLERGIES.   SOCIAL HISTORY:  She is widowed and has children.  She has never smoked. She  is currently retired and lives alone.   FAMILY HISTORY:  Remarkable for mother and brother having heart disease  and  her sister having colon cancer and father having had lung cancer.   REVIEW OF SYSTEMS:  As per history of present illness.  Also see patient  intake form in the chart.   PHYSICAL EXAMINATION:  GENERAL APPEARANCE:  She is a overweight female in no  acute distress.  VITAL SIGNS:  Blood pressure 120/70, pulse 64, temperature 97.9, weight 176  pounds, O2 saturation on room air was 98%.  HEENT:  Pupils are equal, round, reactive to light and accommodation.  Extraocular muscles are intact.  Nares are patent.  At discharge, oropharynx  is clear.  NECK:  Supple without JVD or lymphadenopathy.  There is no palpable  thyromegaly.  CHEST:  Faint basilar crackles.  CARDIOVASCULAR:  Regular rate and rhythm.  ABDOMEN:  Soft, nontender with good bowel sounds.  GENITAL/RECTAL/BREASTS:  Examinations not done and not indicated.  EXTREMITIES:  Lower extremities show 1+ edema.  Pulses intact distally.  NEUROLOGIC:  Alert and oriented with no obvious observable motor defect.   IMPRESSION:  Dyspnea on exertion of unknown etiology.  It does not appear  that she has  significant obstructive lung disease, and her chest x-ray is  not overly impressive.  I really think at this point in time that she needs  to have a VQ scan to rule out the possibility of chronic thromboembolic  disease, and would also do full pulmonary function tests with lung volumes  and diffusion capacity as well.  Obviously, she needs to work on weight loss  which an contribute to her dyspnea.   PLAN:  1. Schedule for full PFTs with lung volumes and diffusion capacity.  2. VQ scan to rule out thromboembolic disease.  3. Work on weight loss.  4. It is unknown whether the patient has had a recent TSH checked but I      will leave that to Dr.      Manus Gunning.  5. The patient will follow up after the above studies are done.            ______________________________  Barbaraann Share, MD,FCCP    [facility]/MedQ  DD:  01/26/2006 DT:  01/27/2006 Job #:  [id number]   cc:   Lyn Records, M.D.

## 2010-09-13 NOTE — Cardiovascular Report (Signed)
Heather Bullock, Heather Bullock NO.:  [id number]   MEDICAL RECORD NO.:  [id number]                   PATIENT TYPE:  OIB   LOCATION:  2864                                 FACILITY:  [facility]   PHYSICIAN:  Meade Maw, M.D.                 DATE OF BIRTH:  Mar 14, 1929   DATE OF PROCEDURE:  01/19/2003  DATE OF DISCHARGE:                              CARDIAC CATHETERIZATION   INDICATIONS FOR PROCEDURE:  A 77 year old female who initially presented for  a right knee arthroscopy and subsequently was referred for cardiology  consult secondary to left bundle branch block.  Adenosine Cardiolite was  performed.  This revealed a decreased uptake in the anterior apical region.  This was felt to be secondary to left bundle branch block; however, there  was reversal at the distal apex.  Options were discussed with the patient in  that she has been demonstrated to have normal catheterization in 1996.  She  wished to proceed with left heart catheterization.   PROCEDURE:  After attaining written informed consent, the patient was  brought to the cardiac catheterization laboratory in the postabsorptive  state.  Preoperative sedation was achieved using IV Versed.  The right groin  was prepped and draped in a usual sterile fashion.  Local anesthesia was  achieved using 1% Xylocaine.  A 6-French hemostasis sheath was placed into  the right femoral artery using the modified Seldinger technique.  Selective  coronary angiography was performed a JL4, JR4 Judkins catheter.  Multiple  views were obtained.  All catheter exchanges were made over a guidewire.  Single plane ventriculogram was performed in the LAO position using a 6-  French pigtail curved catheter.  The patient tolerated the procedure well.   FINDINGS:  Aortic pressure is 134/80.  LV pressure is 135/10.  EDP was 1.  Single plane ventriculogram revealed anterior and apical hypokinesis.  Ejection fraction 45%.  There was no  significant mitral regurgitation noted.   CORONARY ANGIOGRAPHY:  The left main coronary artery bifurcates into the  left anterior descending and circumflex vessel.  There is no disease noted  in the left main coronary artery.   Left anterior descending gives rise to a trivial first diagonal, a large  trifurcating D2, and goes on to end as an apical branch.  There is no  disease in the left anterior descending or its branches.   The circumflex vessel gives rise to an early large OM 1 versus a ramus  vessel, then goes on to give rise to a second and third obtuse marginal.  There is no disease noted in the early large OM 1/ramus vessel.  There is no  disease noted in the circumflex vessel.   Right coronary artery is a large, dominant artery.  Gives rise to two RV  marginals, PDA, and a large PL branch.  There is no disease noted in  the  right coronary artery or its branches.    FINAL IMPRESSION:  Anterior apical hypokinesis most likely related to her  underlying left bundle branch block.  Will continue with medical management.  The patient currently is on Vasotec.  Will continue to increase her Vasotec  and consider addition of Coreg.  The patient may proceed with her left knee  arthroscopy without further studies.  She should continue with a baby  aspirin daily.                                               Meade Maw, M.D.    HP/MEDQ  D:  01/19/2003  T:  01/19/2003  Job:  [id number]   cc:   Bryan Lemma. Manus Gunning, M.D.  [address] [place]  [place] [address]  Fax: [phone]

## 2010-09-13 NOTE — Op Note (Signed)
Heather Bullock, KLOOS NO.:  [id number]   MEDICAL RECORD NO.:  [id number]          PATIENT TYPE:  INP   LOCATION:  5031                         FACILITY:  [facility]   PHYSICIAN:  Lubertha Basque. Dalldorf, M.D.DATE OF BIRTH:  1928-07-15   DATE OF PROCEDURE:  05/21/2004  DATE OF DISCHARGE:                                 OPERATIVE REPORT   PREOPERATIVE DIAGNOSIS:  Right knee degenerative arthritis.   POSTOPERATIVE DIAGNOSIS:  Right knee degenerative arthritis.   PROCEDURE:  Right total knee replacement.   ANESTHESIA:  General.   ATTENDING SURGEON:  Lubertha Basque. Jerl Santos, M.D.   ASSISTANT:  Lindwood Qua, P.A.   INDICATIONS FOR PROCEDURE:  The patient is a 77 year old woman with a very  long history of bilateral knee pain. This is persistent despite many  different oral anti-inflammatories and injectable agents. She has bone-on-  bone contact on the x-rays. She has pain which wakes her from sleep and  limits her daily activities. She is offered a knee replacement operation.  Informed operative consent was obtained after discussion of possible  complications of reaction to anesthesia, infection, DVT, PE, and death. The  patient is a Scientist, product/process development and has stated that she will except no blood  transfusions.   DESCRIPTION OF PROCEDURE:  The patient is brought to the operating suite  where general anesthetic was provided without difficulty. She was positioned  supine and prepped and draped in a normal sterile fashion. After the  administration of IV antibiotics, the right leg was elevated, exsanguinated,  and the tourniquet inflated to about 5. A longitudinal incision was made  across the knee. All appropriate anti-infective measures were used including  preoperative IV Kefzol, closed-hooded exhaust systems for each member the  surgical team, and a Betadine impregnated drape. Dissection was carried  through an abundance of adipose tissue to expose the extensor mechanism.  A  medial parapatellar incision was made through this structure. The knee cap  was flipped and the knee flexed. She had severe degenerative change in all  three compartments but very good bone quality. ACL, PCL were removed along  with most of the fat pad and residual meniscal tissues. An extramedullary  guide was placed in the tibia and orthopedic cut on this bone roughly  parallel to the floor with a slight posterior tilt.  An intramedullary guide  was then placed on the femur to create anterior and posterior cuts, making a  flexion gap of about 10 mm.  A second intramedullary guide was placed in the  femur in order to create a distal cut, making an equal extension gap of 10  mm. Minimal soft tissue release was required for balancing. The femur sized  to a standard plus component wall the tibia sized to a 4.  The appropriate  guides were placed and utilized. Trial reduction was done with these  components and a 10 mm deep-dish spacer. The knee easily came to full  extension, patella tracked well, and the knee easily flexed past 120  degrees. The patella was cut down in thickness by 8 mm to  16 and sized to a  35 with the appropriate guide placed and utilized. The trial components were  removed. Pulsatile lavage irrigation was used to cleanse all three cut bony  surfaces. Cement was mixed, including antibiotic, and was pressurized onto  all three bones. The aforementioned DePuy LCS components were then placed,  which again were standard plus femur, a #4 tibia, 10 mm deep-dish spacer,  and 35 mm all-polyethylene patella. Excess cement was trimmed, the pressure  was held on the components until the cement had hardened completely. The  tourniquet was deflated, and a mild amount bleeding was easily controlled  with Bovie cautery. We took special care to achieve good hemostasis, as we  decided not to use a drain, in light of the fact that we were hoping to  minimize blood loss. The wound was  irrigated several times. We then closed  the extensor mechanism with #1 Vicryl in an interrupted fashion.  Subcutaneous tissues reapproximated in several layers with absorbable suture  followed by skin closure with staples. Adaptic was placed on the wound,  followed by dry gauze and a loose Ace wrap. The knee flexed to 120 degrees  against gravity at the end of the case. Estimated blood loss and  intraoperative fluids can be obtained from the anesthesia records as well as  accurate tourniquet time.   DISPOSITION:  The patient was extubated in the operating room and taken to  recovery room in stable condition.  Plans were for her to be admitted to the  orthopedic surgery service for appropriate postop care to include  perioperative antibiotics and Coumadin plus Lovenox for DVT prophylaxis.      PGD/MEDQ  D:  05/21/2004  T:  05/21/2004  Job:  [id number]

## 2010-09-13 NOTE — Op Note (Signed)
NAMESHANAVIA, Heather Bullock NO.:  [id number]   MEDICAL RECORD NO.:  [id number]                   PATIENT TYPE:  AMB   LOCATION:  ENDO                                 FACILITY:  [facility]   PHYSICIAN:  John C. Madilyn Fireman, M.D.                 DATE OF BIRTH:  [birth year]   DATE OF PROCEDURE:  01/08/2004  DATE OF DISCHARGE:                                 OPERATIVE REPORT   PROCEDURE:  Esophagogastroduodenoscopy.   INDICATION FOR PROCEDURE:  Patient with a history of gastric polyps and  reportedly a history of gastric leiomyoma, who is also undergoing colon  cancer screening today and has recently had complaints of dysphagia, mainly  for liquids.   PROCEDURE:  The patient was placed in the left lateral decubitus position  and placed on the pulse monitor with continuous low-flow oxygen delivered by  nasal cannula.  She was sedated with 62.5 mcg IV fentanyl and 5 mg IV  Versed.  The Olympus video endoscope was advanced under direct vision into  the oropharynx and esophagus.  The esophagus was straight and of normal  caliber with the squamocolumnar line at 38 cm.  There was no visible hiatal  hernia, ring, stricture, or other abnormality of the GE junction.  The  stomach was entered and a small amount of liquid secretions was suctioned  from the fundus.  Retroflexed view of the cardia was unremarkable.  The  fundus and body appeared normal.  The antrum showed a few mild streaks of  erythema and granularity with no focal erosions or ulcers, consistent with  mild gastritis.  She had had a negative CLOtest three years ago and I did  not repeat this.  The pylorus was nondeformed and easily allowed passage of  the endoscope tip into the duodenum.  Both bulb and second portion were well-  inspected and appeared to be within normal limits.  The scope was then  withdrawn and the patient prepared for colonoscopy.  She tolerated the  procedure well, and there were no immediate  complications.   IMPRESSION:  Mild antral gastritis, otherwise normal study.   PLAN:  We will proceed with colonoscopy.                                               John C. Madilyn Fireman, M.D.    [facility]/MEDQ  D:  01/08/2004  T:  01/08/2004  Job:  [id number]   cc:   Bryan Lemma. Manus Gunning, M.D.  [address] [place]  [place] [address]  Fax: [phone]

## 2010-09-13 NOTE — Assessment & Plan Note (Signed)
[name]                               PULMONARY OFFICE NOTE   Heather Bullock, SCARBER                           MRN:          [id number]  DATE:02/05/2006                            DOB:          1928/06/04    SUBJECTIVE:  Heather Bullock comes in today for followup of her dyspnea.  The  patient recently underwent a high-resolution CT of the chest which showed  minimal interstitial abnormalities in the lower lobe, left greater than the  right, which was nonspecific.  I looked at this very closely and it is  really not overly impressive.  There is also a 1 cm nodule in the left base  which may need followup at some point in time.  She was noted to have mild  central bronchiectasis in the left lower lobe and also diffuse bronchial  wall thickening in other aspects of the lung.  There was a small hiatal  hernia and evidence for old granulomatous disease.  The patient continues to  be short of breath despite a very aggressive workup for multiple  consultants.   PHYSICAL EXAM:  BP is 142/70.  Pulse 66.  Temperature is 97.5.  Weight is  175 pounds.  O2 saturation on room air is 96%.  CHEST:  Totally clear.  CARDIAC:  Regular rate and rhythm.   IMPRESSION:  Very severe dyspnea on exertion almost to the point of panting,  which is greatly out of proportion to her objective findings.  The patient  had no significant air flow obstruction on her pulmonary function tests;  however, she does have some findings on her high resolution CT with  bronchial wall thickening and some mild bronchiectasis that may predispose  to subtle air flow obstruction.  I am more than willing to give her a trial  of an aggressive bronchodilator regimen but, again, I suspect this will  change her symptoms very little.  Her symptoms are very much out of  proportion to objective findings.  The CT scan of the chest also showed very  subtle interstitial change in the lower lobes that I did not  feel was overly  impressive.  The patient did have an extremely mild decrease in total lung  capacity on her pulmonary function tests and, unfortunately, she could not  cooperate with the maneuvers required to do a DLCO.  I do not think that she  has an interstitial lung disease but even if she did, it would not cause  this degree of symptomatology given the very minor findings on the x-ray and  minimal decrease in total lung capacity on her pulmonary function tests.  I  am beginning to wonder whether anxiety is playing a role here or not   PLAN:  1. We will give the patient a trial of Symbicort 160/4.5, two puffs b.i.d.      with a spacer and the patient will followup in 4 weeks or sooner if      there are problems.  2. Again, I have told her that we are not finding a lot of  things from a      pulmonary standpoint      that is causing her shortness of breath.  Certainly, I will continue to      work with her and do the best we can.            ______________________________  Barbaraann Share, MD,FCCP      [facility]/MedQ  DD:  02/09/2006  DT:  02/10/2006  Job #:  [id number]   cc:   Bryan Lemma. Manus Gunning, M.D.  Lyn Records, M.D.

## 2010-09-13 NOTE — Op Note (Signed)
NAMELEAHANNA, Heather Bullock NO.:  [id number]   MEDICAL RECORD NO.:  [id number]                   PATIENT TYPE:  AMB   LOCATION:  ENDO                                 [facility]ACILITY:  [facility]   PHYSICIAN:  John C. Madilyn Fireman, M.D.                 DATE OF BIRTH:  January 10, 1929   DATE OF PROCEDURE:  01/08/2004  DATE OF DISCHARGE:                                 OPERATIVE REPORT   PROCEDURE:  Colonoscopy with polypectomy.   INDICATION FOR PROCEDURE:  History of adenomatous colon polyps.   PROCEDURE:  The patient was placed in the left lateral decubitus position  and placed on the pulse monitor with continuous low-flow oxygen delivered by  nasal cannula.  She was sedated with 25 mg IV fentanyl and 2 mg IV Versed.  The Olympus video colonoscope was inserted into the rectum and advanced to  the cecum, confirmed by transillumination of McBurney's point and  visualization of the ileocecal valve and appendiceal orifice.  The prep was  excellent.  The cecum, ascending, and transverse colon all appeared normal  with no masses, polyps, diverticula, or other mucosal abnormalities.  In the  descending and sigmoid colon  there were seen a few scattered diverticula  and other abnormalities.  At the rectosigmoid junction there was a small  polyp approximately 5 mm in diameter, which was fulgurated by hot biopsy.  The remainder of the rectum appeared normal.  Retroflexed view of the anus  revealed only small internal hemorrhoids.  The scope was then withdrawn and  the patient returned to the recovery room in stable condition.  She  tolerated the procedure well, and there were no immediate complications.   IMPRESSION:  1.  Small rectosigmoid polyp.  2.  Left-sided diverticulosis.   PLAN:  Await histology, and will probably repeat colonoscopy in five years.                                               John C. Madilyn Fireman, M.D.    JCH/MEDQ  D:  01/08/2004  T:  01/08/2004  Job:  [id number]   cc:   Bryan Lemma. Manus Gunning, M.D.  [address] [place]  [place] [address]  Fax: [phone]

## 2010-09-13 NOTE — Discharge Summary (Signed)
NAMEYANILEN, ADAMIK NO.:  [id number]   MEDICAL RECORD NO.:  [id number]          PATIENT TYPE:  INP   LOCATION:  5031                         FACILITY:  [facility]   PHYSICIAN:  Lubertha Basque. Dalldorf, M.D.DATE OF BIRTH:  05-25-1928   DATE OF ADMISSION:  05/21/2004  DATE OF DISCHARGE:  05/24/2004                                 DISCHARGE SUMMARY   ADMISSION DIAGNOSES:  1.  Right knee end-stage degenerative joint disease.  2.  Hypertension.  3.  History of left bundle branch block and cardiomyopathy.   DISCHARGE DIAGNOSES:  1.  Right knee end-stage degenerative joint disease.  2.  Hypertension.  3.  History of left bundle branch block and cardiomyopathy.   OPERATION:  Right total knee replacement.   BRIEF HISTORY:  Ms. Peabody is a 77 year old white female patient well known  to our practice.  She is having right knee pain now, pain with every step,  trouble sleeping at night time.  She recently in October 2004 had a knee  arthroscopy which showed some end-stage degenerative joint disease and we  had tried to nurse her along with oral anti-inflammatory medications and  corticosteroid injections and even Visco supplementation, but it has been  unsuccessful.  We discussed treatment options with her; that being total  knee replacement, the risk of anesthesia, infection, DVT, and possible  death.   PERTINENT LABORATORY AND X-RAY FINDINGS:  Postoperatively, her hemoglobin  last testing was 9.3, hematocrit 27.6, platelets at 182, pro time 1.3,  sodium 135, potassium 3.1, glucose 101, BUN 7, creatinine 0.8, calcium 7.9.   COURSE IN THE HOSPITAL:  She had a PCA morphine pump postoperatively, a  Lactated Ringer's IV at 80 mL an hour, Ancef 1 g q.8h x3 doses, Coumadin,  and Lovenox protocol for DVT prophylaxis.  Percocet for pain.  Her other  medications were Hydrochlorothiazide 25 mg, Vytorin 10/40 one daily,  Enalapril 5 mg daily, metoprolol 50 mg daily, ice to her knee,  incentive  spirometry, knee-high TEVs, physical therapy to be weightbearing as  tolerated with PT and rehab consult, and then labs were drawn serially for  two days.  Pharmacy for low-dose Coumadin protocol.  She was on a CPM  machine 6-8 hours per day and advance as tolerated.  Her first day postop  her vital signs were stable.  CPM was in use.  Foley catheter was draining  and showed normal neurovascular status to her toes.  Hemoglobin-hematocrit  was 30.4, INR 1.2, potassium 3.5.  The second day postop, her temperature  was 101, blood pressure 126/64, heart rate 82, hemoglobin 9.8, potassium  3.1, INR 1.4.  Lungs were clear to A&P.  Abdomen was soft and positive bowel  sounds.  Dressing was changed.  No sign of infection or irritation.  Normal  neurovascular status.  She was given some  potassium to raise her potassium  level and then a repeat BMET was done.  Encourage incentive spirometry to  decrease temperature elevation and then she would also stay on her CPM and  also could discontinue her Foley on  that date as well.  She had progressed  well and eventually on the third postoperative day was taken to the rehab  unit.  Condition on discharge is improved.   FOLLOW UP:  She will remain on Percocet for pain.  They  may change her  dressing on a daily basis in rehab.  She can be weightbearing as tolerated.  She will be on low-dose Coumadin protocol as regulated by pharmacy, and she  will remain on her home medicines which were Vytorin, metoprolol, and  Enalapril, and Hydrochlorothiazide.  We will continue to follow her while  she is in the hospital in the rehab unit.  If there are any signs of  infection, increasing redness, drainage, pain, or concern we are happy to  see or they can call us at [phone].      MC/MEDQ  D:  07/18/2004  T:  07/18/2004  Job:  [id number]

## 2010-09-13 NOTE — Procedures (Signed)
[facility]  Patient:    Heather Bullock, Heather Bullock                           MRN: [id number] Proc. Date: 12/01/00 Adm. Date:  [id number] Attending:  Louie Bun                           Procedure Report  PROCEDURE:  Colonoscopy with polypectomy.  INDICATION FOR PROCEDURE:  Rectal bleeding and mild fecal incontinence in a 77 year old patient with no recent colon screening.  DESCRIPTION OF PROCEDURE:  The patient was placed in the left lateral decubitus position then placed on the pulse monitor with continuous low flow oxygen delivered by nasal cannula. She was sedated with 10 mg IV Demerol and 1 mg IV Versed in addition to the 40 mg Demerol and 4 mg of Versed given for the previous EGD. The Olympus video colonoscope was inserted into the rectum and advanced to the cecum, confirmed by transillumination at McBurneys point and visualization of the ileocecal valve and appendiceal orifice. The prep was excellent.  The cecum appeared normal. Within the ascending colon was seen a sessile 8 mm polyp which was fulgurated by hot biopsy. The remainder of the ascending and transverse as well as the descending colon appeared normal with no further masses, polyps, diverticula or other mucosal abnormalities. Within the sigmoid colon, there were seen a few scattered diverticula, no other abnormalities. The rectum appeared normal proximally but there were some small internal hemorrhoids and some apparent fibrosis of the hemorrhoids appreciated by digital exam. The scope was then withdrawn and the patient returned to the recovery room in stable condition. The patient tolerated the procedure well and there were no immediate complications.  IMPRESSION: 1. Ascending colon polyp. 2. Diverticulosis. 3. Internal and external hemorrhoids.  PLAN:  Treat hemorrhoids symptomatically. Will await biopsy result on the polyp and follow-up in the office in a few weeks. DD:  12/01/00 TD:   12/02/00 Job: [id number]

## 2010-09-13 NOTE — Cardiovascular Report (Signed)
Heather Bullock, DUTT NO.:  [id number]   MEDICAL RECORD NO.:  [id number]          PATIENT TYPE:  OIB   LOCATION:  1963                         FACILITY:  [facility]   PHYSICIAN:  Lyn Records, M.D.   DATE OF BIRTH:  Mar 14, 1929   DATE OF PROCEDURE:  03/16/2006  DATE OF DISCHARGE:                            CARDIAC CATHETERIZATION   INDICATIONS FOR TEST:  The patient had significant dyspnea.  Prior  history of idiopathic cardiomyopathy.  Study is being done to rule out  pulmonary hypertension, evidence of pericardial constriction, other  potential causes of dyspnea in the absence of significant pulmonary  abnormalities found on evaluation by Dr. Shelle Iron.   PROCEDURE PERFORMED:  1. Left heart catheterization .  2. Right heart catheterization.  3. Left ventriculography.  4. Selective coronary angiography.  5. Cardiac outputs by thermodilution.   DESCRIPTION:  After informed consent, a 7-French sheath was placed in  the right femoral vein and a 4-French sheath in the right femoral  artery, both using the modified Seldinger technique.  A 4-French A2  multipurpose catheter was used for aortic and left ventricular  hemodynamic recordings and hand injection of left ventriculography.  We  then used left and right #4 Judkins catheters 4 [place] for both left and  right coronary angiographies, respectively.   Prior to left ventriculography and coronary angiography, right heart and  left heart simultaneous hemodynamic recordings were performed.  A 7-  [place] Swan-Ganz catheter was used for serial hemodynamic recordings in  the right atrial, right ventricle, pulmonary artery and pulmonary  capillary wedge.  Simultaneous right atrial LV, RV LV, and pulmonary  capillary wedge LV recordings were performed.  Oximetry was performed in  the main pulmonary artery and also in the ascending aorta.  No evidence  of a step-up was noted with the screening procedure with the aortic O2  saturation of 93% and the pulmonary artery O2 saturation of 69%.  The  thermodilution cardiac output was then completed.  Pullback to the right  heart was performed, and the case was terminated.  No complications  occurred.  Hemostasis by manual compression was performed and achieved  without complications.   RESULTS:  1. Hemodynamic data:      a.     Aortic pressure 138/54.      b.     Left ventricular pressure 134/80 mmHg.      c.     Right atrial mean pressure of 4 mmHg.      d.     Right ventricular pressure 32/6 mmHg.      e.     Pulmonary artery pressure 33/20 mmHg.      f.     Pulmonary capillary wedge mean pressure 4 mmHg.      g.     Cardiac output by thermodilution 5.2 liters per minute      h.     Fick cardiac output 3.8 liters per minute.  2. Left ventriculography:  The left ventricle is mildly dilated.      Contractility is felt to be in the lower  limit of normal with an      ejection fraction of at least 50%.  No significant mitral      regurgitation is noted.  However, hand injection ventriculography      was used for visualization.  3. Coronary angiography.      a.     Left main coronary artery:  Widely patent.      b.     Left anterior descending coronary:  Large vessel that is       transapical giving origin to a trifurcating first diagonal vessel       that was normal.      c.     Ramus intermedius branch:  Large ramus branch arises from       the distal left main, is large and free of any significant       obstruction.      d.     Circumflex artery:  Circumflex gives origin to 3 obtuse       marginal branches with a third obtuse marginal being the dominant       of the branches.      e.     Right coronary:  The right coronary artery is a large vessel       given the PDA and left ventricular branches.  The vessel was free       of any significant obstruction.   CONCLUSION:  1. Essentially normal coronary arteries.  2. Normal right heart pressures without  evidence of pulmonary      hypertension or pressure equalization to suggest constriction or      restrictive myocardial disease.  3. Normal overall LV function with EF of 50 to 55% without significant      mitral regurgitation noted.  4. Dyspnea, uncertain etiology, not felt to be cardiac in origin.      Lyn Records, M.D.  Electronically Signed     HWS/MEDQ  D:  03/16/2006  T:  03/16/2006  Job:  [id number]   cc:   Barbaraann Share, MD,FCCP  Bryan Lemma. Manus Gunning, M.D.

## 2010-09-13 NOTE — Procedures (Signed)
[facility]  Patient:    Heather Bullock, Heather Bullock                           MRN: [id number] Proc. Date: 12/01/00 Adm. Date:  [id number] Attending:  Louie Bun                           Procedure Report  NO DICTATION. DD:  12/01/00 TD:  12/02/00 Job: [id number]

## 2010-09-16 ENCOUNTER — Encounter: Payer: Self-pay | Admitting: Cardiology

## 2010-09-16 ENCOUNTER — Ambulatory Visit (INDEPENDENT_AMBULATORY_CARE_PROVIDER_SITE_OTHER): Payer: Medicare PPO | Admitting: Cardiology

## 2010-09-16 DIAGNOSIS — I447 Left bundle-branch block, unspecified: Secondary | ICD-10-CM

## 2010-09-16 DIAGNOSIS — I959 Hypotension, unspecified: Secondary | ICD-10-CM

## 2010-09-16 DIAGNOSIS — R079 Chest pain, unspecified: Secondary | ICD-10-CM

## 2010-09-16 DIAGNOSIS — I5022 Chronic systolic (congestive) heart failure: Secondary | ICD-10-CM

## 2010-09-16 NOTE — Assessment & Plan Note (Signed)
I think she is euvolemic. She was taken off the Cozaar with her low blood pressure she will remain off this only taking the meds as listed. I will check a BNP level which was normal in March.

## 2010-09-16 NOTE — Progress Notes (Signed)
HPI The patient presents for followup of her nonischemic cardiomyopathy. Since I last saw her she has had problems apparent immunodeficiency. I am unclear on this. She is being followed by a rheumatologist and was treated with Plaquinel.  She did not tolerate this and currently is on steroids.  She called with complaints of weakness and fatigue and had reported blood pressures occasionally in the systolics 60. She had no syncope but she does at times have to lay down.  She does get dyspneic but this is episodic.  She does not describe PND or orthopnea. She has had some mild lower extremity swelling at the end of the day. He's had chest pressure. She's had occasional palpitations.  Allergies  Allergen Reactions  . Arthro-Complex   . Bacitracin   . Metoclopramide Hcl     Reglan  . Nabumetone     Relafen  . Nitroglycerin   . Nsaids   . Plaquenil (Hydroxychloroquine Sulfate)   . Triple Antibiotic     Current Outpatient Prescriptions  Medication Sig Dispense Refill  . Ascorbic Acid (VITAMIN C) 500 MG tablet Take 1,000 mg by mouth daily.        Marland Kitchen aspirin 81 MG tablet Take 81 mg by mouth daily.        . Calcium Citrate-Vitamin D (CALCIUM CITRATE + D PO) Take by mouth.        . carvedilol (COREG) 3.125 MG tablet Take 3.125 mg by mouth 2 (two) times daily.        . diazepam (VALIUM) 5 MG tablet daily.       . Diclofenac Epolamine (FLECTOR) 1.3 % PTCH as needed.        . docusate sodium (COLACE) 100 MG capsule at bedtime.       . fesoterodine (TOVIAZ) 4 MG TB24 Take 4 mg by mouth daily.        . furosemide (LASIX) 40 MG tablet Take 40 mg by mouth 2 (two) times daily.        Marland Kitchen gabapentin (NEURONTIN) 300 MG capsule Take 300 mg by mouth 2 (two) times daily.       . Glucosamine-Chondroitin (OSTEO BI-FLEX REGULAR STRENGTH PO) Take 2 tablets by mouth daily.        Marland Kitchen HYDROcodone-acetaminophen (LORTAB) 10-500 MG per tablet as needed.        . lansoprazole (PREVACID) 30 MG capsule Take 30 mg by mouth  daily.        . Multiple Vitamin (MULTIVITAMIN) capsule Take 1 capsule by mouth daily.        . pravastatin (PRAVACHOL) 40 MG tablet Take 40 mg by mouth daily.        . predniSONE (DELTASONE) 5 MG tablet Take 5 mg by mouth daily.        . Psyllium (VEGETABLE LAXATIVE PO) as needed.        Marland Kitchen tiZANidine (ZANAFLEX) 4 MG capsule Take by mouth 2 (two) times daily.       . Vitamin D, Ergocalciferol, (DRISDOL) 50000 UNITS CAPS Take 50,000 Units by mouth every 7 (seven) days.        Marland Kitchen DISCONTD: losartan (COZAAR) 25 MG tablet Take 25 mg by mouth 2 (two) times daily.          Past Medical History  Diagnosis Date  . LBBB (left bundle branch block)   . Nonischemic cardiomyopathy     EF 25-30% echo 2010, 60% echo Nov 2001  . Hypertension   . Diverticulitis  Past Surgical History  Procedure Date  . Crt-d-st. jude's   . Replacement total knee   . Abdominal hysterectomy   . Appendectomy   . Neck surgery     ROS: As stated in the HPI and negative for all other systems.  PHYSICAL EXAM BP 133/80  Pulse 64  Resp 18  Ht 5\' 3"  (1.6 m)  Wt 168 lb (76.204 kg)  BMI 29.76 kg/m2 GENERAL:  Well appearing HEENT:  Pupils equal round and reactive, fundi not visualized, oral mucosa unremarkable NECK:  No jugular venous distention, waveform within normal limits, carotid upstroke brisk and symmetric, no bruits, no thyromegaly LYMPHATICS:  No cervical, inguinal adenopathy LUNGS:  Clear to auscultation bilaterally BACK:  No CVA tenderness CHEST:  ICD intact HEART:  PMI not displaced or sustained,S1 and S2 within normal limits, no S3, no S4, no clicks, no rubs, no murmurs ABD:  Flat, positive bowel sounds normal in frequency in pitch, no bruits, no rebound, no guarding, no midline pulsatile mass, no hepatomegaly, no splenomegaly EXT:  2 plus pulses throughout, no edema, no cyanosis no clubbing SKIN:  No rashes no nodules NEURO:  Cranial nerves II through XII grossly intact, motor grossly intact  throughout PSYCH:  Cognitively intact, oriented to person place and time   ASSESSMENT AND PLAN

## 2010-09-16 NOTE — Patient Instructions (Signed)
Please have blood work drawn today  (BNP 786.59) Continue current medications Follow up in 3 months with Dr Antoine Poche

## 2010-09-16 NOTE — Assessment & Plan Note (Signed)
This is atypical. There will be no change in therapy or further workup.

## 2010-09-16 NOTE — Assessment & Plan Note (Signed)
As above. No change in therapy or further evaluation is indicated. I reviewed her blood pressure diary and her blood pressure though somewhat labile is no longer dipping below 90 systolic.

## 2010-09-18 ENCOUNTER — Ambulatory Visit (INDEPENDENT_AMBULATORY_CARE_PROVIDER_SITE_OTHER): Payer: Medicare PPO | Admitting: *Deleted

## 2010-09-18 DIAGNOSIS — I5022 Chronic systolic (congestive) heart failure: Secondary | ICD-10-CM

## 2010-09-18 DIAGNOSIS — I428 Other cardiomyopathies: Secondary | ICD-10-CM

## 2010-09-18 NOTE — Progress Notes (Signed)
icd check

## 2010-09-25 ENCOUNTER — Telehealth: Payer: Self-pay | Admitting: Cardiology

## 2010-09-25 NOTE — Telephone Encounter (Signed)
Discussed results of normal BNP with pt.

## 2010-09-25 NOTE — Telephone Encounter (Signed)
Pt calling re bloodwork results.

## 2010-09-30 ENCOUNTER — Ambulatory Visit: Payer: Self-pay | Admitting: Pulmonary Disease

## 2010-10-10 ENCOUNTER — Ambulatory Visit
Admission: RE | Admit: 2010-10-10 | Discharge: 2010-10-10 | Disposition: A | Discharge status: Home / Self Care | Payer: Medicare PPO | Source: Ambulatory Visit | Attending: Family Medicine | Admitting: Family Medicine

## 2010-10-10 DIAGNOSIS — Z1231 Encounter for screening mammogram for malignant neoplasm of breast: Secondary | ICD-10-CM

## 2010-11-27 ENCOUNTER — Telehealth: Payer: Self-pay | Admitting: Pulmonary Disease

## 2010-11-27 NOTE — Telephone Encounter (Signed)
Alida, have you seen CMN for this pt's CPAP supplies? Please advise, thanks!

## 2010-12-02 NOTE — Telephone Encounter (Signed)
CMN was signed by Dr Shelle Iron and I faxed to this am .Kandice Hams

## 2010-12-16 ENCOUNTER — Encounter: Payer: Self-pay | Admitting: Cardiology

## 2010-12-19 ENCOUNTER — Ambulatory Visit (INDEPENDENT_AMBULATORY_CARE_PROVIDER_SITE_OTHER): Payer: Medicare PPO | Admitting: Cardiology

## 2010-12-19 ENCOUNTER — Encounter: Payer: Self-pay | Admitting: Cardiology

## 2010-12-19 DIAGNOSIS — I5022 Chronic systolic (congestive) heart failure: Secondary | ICD-10-CM

## 2010-12-19 DIAGNOSIS — R0600 Dyspnea, unspecified: Secondary | ICD-10-CM

## 2010-12-19 DIAGNOSIS — R0989 Other specified symptoms and signs involving the circulatory and respiratory systems: Secondary | ICD-10-CM

## 2010-12-19 DIAGNOSIS — I959 Hypotension, unspecified: Secondary | ICD-10-CM

## 2010-12-19 DIAGNOSIS — R079 Chest pain, unspecified: Secondary | ICD-10-CM

## 2010-12-19 NOTE — Assessment & Plan Note (Signed)
Her EF was normal at the last echo.

## 2010-12-19 NOTE — Progress Notes (Signed)
HPI The patient presents for followup of dyspnea.  I last saw her she continues to have dyspnea is chronic.  Some days are worse than others.  I do note that a recent BNP was elevated slightly above baseline.  Her blood pressure continues to fluctuate.  An some days systolics are in the 54s and she feels week and light headed.  She does not relate any of her symptoms to dietary intake though she does need frozen foods quite frequently. She's not had any syncope. She has not had any chest pressure, neck or arm discomfort. He denies any palpitation, PND or orthopnea. She has some swelling  Allergies  Allergen Reactions  . Arthro-Complex   . Bacitracin   . Metoclopramide Hcl     Reglan  . Nabumetone     Relafen  . Nitroglycerin   . Nsaids   . Plaquenil (Hydroxychloroquine Sulfate)   . Triple Antibiotic     Current Outpatient Prescriptions  Medication Sig Dispense Refill  . Ascorbic Acid (VITAMIN C) 500 MG tablet Take 1,000 mg by mouth daily.        Marland Kitchen aspirin 81 MG tablet Take 81 mg by mouth daily.        . Calcium Citrate-Vitamin D (CALCIUM CITRATE + D PO) Take by mouth.        . carvedilol (COREG) 3.125 MG tablet Take 3.125 mg by mouth 2 (two) times daily.        . diazepam (VALIUM) 5 MG tablet daily.       . Diclofenac Epolamine (FLECTOR) 1.3 % PTCH as needed.        . docusate sodium (COLACE) 100 MG capsule at bedtime.       . gabapentin (NEURONTIN) 300 MG capsule Take 300 mg by mouth 2 (two) times daily.       . Glucosamine-Chondroitin (OSTEO BI-FLEX REGULAR STRENGTH PO) Take 2 tablets by mouth daily.        Marland Kitchen HYDROcodone-acetaminophen (LORTAB) 10-500 MG per tablet as needed.        . lansoprazole (PREVACID) 30 MG capsule Take 30 mg by mouth daily.        . Multiple Vitamin (MULTIVITAMIN) capsule Take 1 capsule by mouth daily.        . NON FORMULARY Mega Red daily       . oxybutynin (DITROPAN) 5 MG tablet Take 5 mg by mouth daily.        . pravastatin (PRAVACHOL) 40 MG tablet Take  40 mg by mouth daily.        . predniSONE (DELTASONE) 5 MG tablet Take 5 mg by mouth daily.        . Psyllium (VEGETABLE LAXATIVE PO) as needed.        Marland Kitchen tiZANidine (ZANAFLEX) 4 MG capsule Take by mouth 2 (two) times daily.       . Vitamin D, Ergocalciferol, (DRISDOL) 50000 UNITS CAPS Take 50,000 Units by mouth every 7 (seven) days.        . furosemide (LASIX) 40 MG tablet Take 40 mg by mouth 2 (two) times daily.          Past Medical History  Diagnosis Date  . LBBB (left bundle branch block)   . Nonischemic cardiomyopathy     EF 25-30% echo 2010, 60% echo Nov 2001  . Hypertension   . Diverticulitis     Past Surgical History  Procedure Date  . Crt-d-st. jude's   . Replacement total knee   .  Abdominal hysterectomy   . Appendectomy   . Neck surgery     ROS: As stated in the HPI and negative for all other systems.  PHYSICAL EXAM BP 124/82  Pulse 88  Resp 16  Ht 5\' 3"  (1.6 m)  Wt 175 lb (79.379 kg)  BMI 31.00 kg/m2 GENERAL:  Well appearing HEENT:  Pupils equal round and reactive, fundi not visualized, oral mucosa unremarkable NECK:  No jugular venous distention, waveform within normal limits, carotid upstroke brisk and symmetric, no bruits, no thyromegaly LYMPHATICS:  No cervical, inguinal adenopathy LUNGS:  Clear to auscultation bilaterally BACK:  No CVA tenderness CHEST:  ICD intact HEART:  PMI not displaced or sustained,S1 and S2 within normal limits, no S3, no S4, no clicks, no rubs, no murmurs ABD:  Flat, positive bowel sounds normal in frequency in pitch, no bruits, no rebound, no guarding, no midline pulsatile mass, no hepatomegaly, no splenomegaly EXT:  2 plus pulses throughout, no edema, no cyanosis no clubbing SKIN:  No rashes no nodules NEURO:  Cranial nerves II through XII grossly intact, motor grossly intact throughout PSYCH:  Cognitively intact, oriented to person place and time  EKG:  Atrial paced rhythm, left bundle branch block, left axis deviation,  PVCs   ASSESSMENT AND PLAN

## 2010-12-19 NOTE — Assessment & Plan Note (Signed)
Her dyspnea is multifactorial. I did again discuss with her salt as she eats a lot of frozen food. She denies any relationship but I would suggest that she might have this issue. I do not want to give her increased Lasix a day as her blood pressure earlier systolic was 74.

## 2010-12-19 NOTE — Assessment & Plan Note (Signed)
She is having no chest pressure. No change in therapy is indicated.

## 2010-12-19 NOTE — Assessment & Plan Note (Signed)
We discussed the strategy of holding carvedilol if  her systolic blood pressure is less than 90.

## 2010-12-19 NOTE — Patient Instructions (Signed)
PLEASE CONTINUE ALL MEDICATIONS AS LISTED.  HOLD CARVEDILOL IF YOUR BLOOD PRESSURE IS LESS THAN 90.  Follow up with Dr Antoine Poche in 4 months.

## 2010-12-24 ENCOUNTER — Ambulatory Visit (INDEPENDENT_AMBULATORY_CARE_PROVIDER_SITE_OTHER): Payer: Medicare PPO | Admitting: Internal Medicine

## 2010-12-24 ENCOUNTER — Encounter: Payer: Self-pay | Admitting: Internal Medicine

## 2010-12-24 DIAGNOSIS — I447 Left bundle-branch block, unspecified: Secondary | ICD-10-CM

## 2010-12-24 DIAGNOSIS — Z95810 Presence of automatic (implantable) cardiac defibrillator: Secondary | ICD-10-CM

## 2010-12-24 DIAGNOSIS — I5022 Chronic systolic (congestive) heart failure: Secondary | ICD-10-CM

## 2010-12-24 DIAGNOSIS — I959 Hypotension, unspecified: Secondary | ICD-10-CM

## 2010-12-24 NOTE — Progress Notes (Signed)
HPI Heather Bullock  is seen in followup for an ICD implanted initially as part of CRTassessment discontinuation of the LV lead. There is initial improvement in her symptoms and then she has been challenged by dyspnea. She has been seen by multiple specialists for this. There is some question as to whether she had neuromuscular disease. A referral was suggested to [facility]. She has declined to proceed that way.  Her other complaint is early morning lightheadedness and fatigue associated with hypotension. This occurs after she. She has not been able to elucidate specific meals or foods that are associated with these events.  The patient denies chest pain edema or palpitations.  There has been no syncope or presyncope.  Allergies  Allergen Reactions  . Arthro-Complex   . Bacitracin   . Metoclopramide Hcl     Reglan  . Nabumetone     Relafen  . Nitroglycerin   . Nsaids   . Plaquenil (Hydroxychloroquine Sulfate)   . Triple Antibiotic     Current Outpatient Prescriptions  Medication Sig Dispense Refill  . Ascorbic Acid (VITAMIN C) 500 MG tablet Take 500 mg by mouth 2 (two) times daily.       Marland Kitchen aspirin 81 MG tablet Take 81 mg by mouth daily.        . Calcium Citrate-Vitamin D (CALCIUM CITRATE + D PO) Take by mouth.        . carvedilol (COREG) 3.125 MG tablet Take 3.125 mg by mouth 2 (two) times daily.        . diazepam (VALIUM) 5 MG tablet Take 5 mg by mouth daily. 1/2 tablet twice a day      . Diclofenac Epolamine (FLECTOR) 1.3 % PTCH as needed.        . furosemide (LASIX) 40 MG tablet Take 40 mg by mouth 2 (two) times daily.        Marland Kitchen gabapentin (NEURONTIN) 300 MG capsule Take 300 mg by mouth 2 (two) times daily.       . Glucosamine-Chondroitin (OSTEO BI-FLEX REGULAR STRENGTH PO) Take 2 tablets by mouth daily.        Marland Kitchen HYDROcodone-acetaminophen (LORTAB) 10-500 MG per tablet as needed.        . lansoprazole (PREVACID) 30 MG capsule Take 30 mg by mouth daily.        . Multiple Vitamin  (MULTIVITAMIN) capsule Take 1 capsule by mouth daily.        . NON FORMULARY Mega Red daily       . oxybutynin (DITROPAN) 5 MG tablet Take 5 mg by mouth daily.        . pravastatin (PRAVACHOL) 40 MG tablet Take 40 mg by mouth daily.        . predniSONE (DELTASONE) 5 MG tablet Take 5 mg by mouth daily.        . Psyllium (VEGETABLE LAXATIVE PO) as needed.        Marland Kitchen tiZANidine (ZANAFLEX) 4 MG capsule Take by mouth 2 (two) times daily.       . Vitamin D, Ergocalciferol, (DRISDOL) 50000 UNITS CAPS Take 50,000 Units by mouth every 7 (seven) days.        Marland Kitchen docusate sodium (COLACE) 100 MG capsule at bedtime.         Past Medical History  Diagnosis Date  . LBBB (left bundle branch block)   . Nonischemic cardiomyopathy     EF 25-30% echo 2010, 60% echo Nov 2001  . Hypertension   . Diverticulitis  Past Surgical History  Procedure Date  . Crt-d-st. jude's   . Replacement total knee   . Abdominal hysterectomy   . Appendectomy   . Neck surgery     ROS: As stated in the HPI and negative for all other systems.  PHYSICAL EXAM BP 132/77  Pulse 74  Ht 5\' 3"  (1.6 m)  Wt 177 lb (80.287 kg)  BMI 31.35 kg/m2 GENERAL:  Well appearing HEENT normal LUNGS:  Clear to auscultation bilaterally BACK:  No CVA tenderness CHEST:  ICD pocket well-healed HEART:  PMI not displaced or sustained,S1 and S2 within normal limits, Without murmurs or gallops ABD:  Soft with active bowel soundsEXT:  2 plus pulses throughout, no edema, no cyanosis no clubbing SKIN:  No rashes no nodules NEURO:  Cranial nerves II through XII grossly intact, motor grossly intact throughout PSYCH:  Cognitively intact,     ASSESSMENT AND PLAN

## 2010-12-24 NOTE — Assessment & Plan Note (Signed)
The patient's device was interrogated.  The information was reviewed. No changes were made in the programming.    

## 2010-12-24 NOTE — Assessment & Plan Note (Signed)
We discussed extensively the physiology of postprandial hypotension. I've asked her to try to keep a record and identify any food correlates with her symptoms, with the observation being that fatty foods, hotter foods, and larger meals are more likely to precipitate postprandial hypotension because of splanchnic shunting   we also discussed the possibility of taking her a.m. Blood pressure medications about an hour or so after her meal so as to avoid a confluence of the facts.

## 2010-12-24 NOTE — Patient Instructions (Signed)
Your physician recommends that you schedule a follow-up appointment in: 3 months with Kristin/Paula for a device check.  Your physician wants you to follow-up in: 1 year with Dr. Klein. You will receive a reminder letter in the mail two months in advance. If you don't receive a letter, please call our office to schedule the follow-up appointment.  Your physician recommends that you continue on your current medications as directed. Please refer to the Current Medication list given to you today.  

## 2010-12-25 LAB — ICD DEVICE OBSERVATION
AL AMPLITUDE: 1.7 mv
ATRIAL PACING ICD: 58 pct
CHARGE TIME: 9 s
DEVICE MODEL ICD: 543254
FVT: 0
HV IMPEDENCE: 47 Ohm
RV LEAD IMPEDENCE ICD: 525 Ohm
TOT-0007: 4
TOT-0009: 3
TOT-0010: 14
VF: 0

## 2011-02-04 LAB — NO BLOOD PRODUCTS

## 2011-02-04 LAB — CBC
MCV: 95
RBC: 3.82 — ABNORMAL LOW
WBC: 7.2

## 2011-02-10 ENCOUNTER — Telehealth: Payer: Self-pay | Admitting: Cardiology

## 2011-02-10 NOTE — Telephone Encounter (Signed)
Pt called and has been having problems with low blood pressure.  On  Sunday her HR was 45.  It came up in the am.  She is concerned that it was that low.  Please call her and advise if she should do anything different.

## 2011-02-12 NOTE — Telephone Encounter (Signed)
Calling to follow up on pt.  States that just one morning when she took her BP and P  - her HR was 45.  She checked it twice with her machine and then attempted to check it at her wrist.  All three times it was 45.  She does not report feeling poorly.  She does report that her pulse felt irregular and was hard to feel.  Instructed pt that she could be having premature beats and that's why she feels her heart rate was irregular.  She does not have a history of At Fib.  Instructed pt to let us know if she starts to feel this way again.  She will continue to monitor.  I will forward information to Dr Antoine Poche for his knowledge.

## 2011-02-17 ENCOUNTER — Telehealth: Payer: Self-pay | Admitting: Cardiology

## 2011-02-17 NOTE — Telephone Encounter (Signed)
Spoke with patient this am and she stated yesterday and today she was just feeling weak.  Stated when she tried to get up out of bed fell back down on bed.  Denied any dizziness.  Her blood pressure yesterday 130-139/72-74 heart rate 62-77 and today 145/82 heart rate 67.  Advised Dr Antoine Poche out of office today and to contact PCP.  Stated she felt so weak she wouldn't be able to go anywhere.  Discussed with Lawson Fiscal NP and scheduled an appointment for tomorrow.  When I called patient back to inform she needed to be seen, she stated she was feeling much better.  Advised of appointment and she stated she would try to make it.  Will call back if unable to come in

## 2011-02-17 NOTE — Telephone Encounter (Signed)
Pt called. She is having some issues with her blood pressure she wants to talk to you

## 2011-02-18 ENCOUNTER — Encounter: Payer: Self-pay | Admitting: Nurse Practitioner

## 2011-02-18 ENCOUNTER — Ambulatory Visit (INDEPENDENT_AMBULATORY_CARE_PROVIDER_SITE_OTHER): Payer: Medicare PPO | Admitting: Nurse Practitioner

## 2011-02-18 VITALS — BP 114/72 | HR 66 | Ht 62.0 in | Wt 176.1 lb

## 2011-02-18 DIAGNOSIS — R5383 Other fatigue: Secondary | ICD-10-CM

## 2011-02-18 DIAGNOSIS — I5022 Chronic systolic (congestive) heart failure: Secondary | ICD-10-CM

## 2011-02-18 DIAGNOSIS — R531 Weakness: Secondary | ICD-10-CM

## 2011-02-18 DIAGNOSIS — R5381 Other malaise: Secondary | ICD-10-CM

## 2011-02-18 LAB — CBC WITH DIFFERENTIAL/PLATELET
Basophils Absolute: 0 10*3/uL (ref 0.0–0.1)
Basophils Relative: 0.3 % (ref 0.0–3.0)
Eosinophils Absolute: 0.2 10*3/uL (ref 0.0–0.7)
Eosinophils Relative: 1.7 % (ref 0.0–5.0)
HCT: 39.2 % (ref 36.0–46.0)
Hemoglobin: 13.2 g/dL (ref 12.0–15.0)
Lymphocytes Relative: 17.6 % (ref 12.0–46.0)
Lymphs Abs: 1.8 10*3/uL (ref 0.7–4.0)
MCHC: 33.7 g/dL (ref 30.0–36.0)
MCV: 96.4 fl (ref 78.0–100.0)
Monocytes Absolute: 0.7 10*3/uL (ref 0.1–1.0)
Monocytes Relative: 6.5 % (ref 3.0–12.0)
Neutro Abs: 7.5 10*3/uL (ref 1.4–7.7)
Neutrophils Relative %: 73.9 % (ref 43.0–77.0)
Platelets: 292 10*3/uL (ref 150.0–400.0)
RBC: 4.07 Mil/uL (ref 3.87–5.11)
RDW: 14.4 % (ref 11.5–14.6)
WBC: 10.1 10*3/uL (ref 4.5–10.5)

## 2011-02-18 LAB — BASIC METABOLIC PANEL
BUN: 25 mg/dL — ABNORMAL HIGH (ref 6–23)
CO2: 31 mEq/L (ref 19–32)
Calcium: 9.2 mg/dL (ref 8.4–10.5)
Chloride: 98 mEq/L (ref 96–112)
Creatinine, Ser: 1.4 mg/dL — ABNORMAL HIGH (ref 0.4–1.2)
GFR: 38.74 mL/min — ABNORMAL LOW (ref >60.00)
Glucose, Bld: 102 mg/dL — ABNORMAL HIGH (ref 70–99)
Potassium: 3.8 mEq/L (ref 3.5–5.1)
Sodium: 142 mEq/L (ref 135–145)

## 2011-02-18 LAB — TSH: TSH: 2.71 u[IU]/mL (ref 0.35–5.50)

## 2011-02-18 NOTE — Assessment & Plan Note (Signed)
Not sure what to make of her recent episode. She was very active over the prior week and may have just over exerted herself. We will check some labs today. Her device is ok. I have not ordered a repeat echo but if she has persistence of symptoms we will update. She is to see Dr. Antoine Poche in December. Patient is agreeable to this plan and will call if any problems develop in the interim.

## 2011-02-18 NOTE — Patient Instructions (Signed)
We are going to check some labs today to look into this weakness.   Your device check is ok.   Keep your appointment in December with Dr. Antoine Poche.   Stay on your current medicines for now.   We may repeat your ultrasound if your symptoms persist.

## 2011-02-18 NOTE — Assessment & Plan Note (Signed)
Last echo in 2011 shows normal EF.

## 2011-02-18 NOTE — Progress Notes (Addendum)
Georgiana Shore Date of Birth: 1928-10-17 Medical Record #[id number]  History of Present Illness: Ms. Heather Bullock is seen today for a work in visit. She is seen for Dr. Antoine Poche. She has a nonischemic cardiomyopathy. Her EF per last echo one year ago though showed her EF to now be normal. She is only on very low dose Coreg and Lasix. She comes in today because of a spell she had on Sunday. She woke up Sunday and tried to get out of bed. However, she "fell over". She tried multiple times only to "fall over". She was not dizzy or lightheaded and had no syncope. She had no paralysis and could move all extremities.  She is always fatigued but it was worse on Sunday. She spent most of the day in bed as she did yesterday. She is always short of breath and that is no different. No chest pain. She will have some palpitations. No ICD shocks that she is aware of. She does see Dr. Shelle Iron for her breathing and says that her muscles do not work well and that makes her short of breath. She has no cough, swelling, fever or chills. She will have occasional edema that is unchanged.   Current Outpatient Prescriptions on File Prior to Visit  Medication Sig Dispense Refill  . Ascorbic Acid (VITAMIN C) 500 MG tablet Take 500 mg by mouth 2 (two) times daily.       Marland Kitchen aspirin 81 MG tablet Take 81 mg by mouth daily.        . Calcium Citrate-Vitamin D (CALCIUM CITRATE + D PO) Take by mouth.        . diazepam (VALIUM) 5 MG tablet Take 5 mg by mouth daily.       . Diclofenac Epolamine (FLECTOR) 1.3 % PTCH as needed.        . docusate sodium (COLACE) 100 MG capsule at bedtime.       . furosemide (LASIX) 40 MG tablet Take 40 mg by mouth 2 (two) times daily.        Marland Kitchen gabapentin (NEURONTIN) 300 MG capsule Take 300 mg by mouth 2 (two) times daily.       . Glucosamine-Chondroitin (OSTEO BI-FLEX REGULAR STRENGTH PO) Take 2 tablets by mouth daily.        Marland Kitchen HYDROcodone-acetaminophen (LORTAB) 10-500 MG per tablet as needed.        .  lansoprazole (PREVACID) 30 MG capsule Take 30 mg by mouth daily.        . Multiple Vitamin (MULTIVITAMIN) capsule Take 1 capsule by mouth daily.        . NON FORMULARY Mega Red daily       . oxybutynin (DITROPAN) 5 MG tablet Take 5 mg by mouth daily.        . pravastatin (PRAVACHOL) 40 MG tablet Take 40 mg by mouth daily.        . predniSONE (DELTASONE) 5 MG tablet Take 5 mg by mouth daily.        . Psyllium (VEGETABLE LAXATIVE PO) as needed.        Marland Kitchen tiZANidine (ZANAFLEX) 4 MG capsule Take by mouth 2 (two) times daily.       . Vitamin D, Ergocalciferol, (DRISDOL) 50000 UNITS CAPS Take 50,000 Units by mouth every 7 (seven) days.        . carvedilol (COREG) 3.125 MG tablet Take 3.125 mg by mouth 2 (two) times daily.          Allergies  Allergen Reactions  . Arthro-Complex   . Bacitracin   . Metoclopramide Hcl     Reglan  . Nabumetone     Relafen  . Nitroglycerin   . Nsaids   . Plaquenil (Hydroxychloroquine Sulfate)   . Triple Antibiotic     Past Medical History  Diagnosis Date  . LBBB (left bundle branch block)   . Nonischemic cardiomyopathy     EF 25-30% echo 2010, 60% echo Nov 2001  . Hypertension   . Diverticulitis   . ICD (implantable cardiac defibrillator) in place     CRT-D [employer]    Past Surgical History  Procedure Date  . Crt-d-st. jude's   . Replacement total knee   . Abdominal hysterectomy   . Appendectomy   . Neck surgery     History  Smoking status  . Never Smoker   Smokeless tobacco  . Never Used    History  Alcohol Use No    Family History  Problem Relation Age of Onset  . Heart disease Son   . Colon cancer Sister   . Lung cancer Father     Review of Systems: The review of systems is per the HPI.  All other systems were reviewed and are negative.  Physical Exam: BP 114/72  Pulse 66  Ht 5\' 2"  (1.575 m)  Wt 176 lb 1.9 oz (79.888 kg)  BMI 32.21 kg/m2  SpO2 98% Patient is very pleasant and in no acute distress. Skin is warm and  dry. Color is normal.  HEENT is unremarkable. Normocephalic/atraumatic. PERRL. Sclera are nonicteric. Neck is supple. No masses. No JVD. Lungs are clear. Cardiac exam shows a regular rate and rhythm. No S3. Abdomen is soft. Extremities are with trace pedal edema. Gait and ROM are intact. No gross neurologic deficits noted.   LABORATORY DATA: ICD is checked and shows no recent episodes per [facility]. EKG shows sinus with a LBBB.    Assessment / Plan:

## 2011-03-26 ENCOUNTER — Ambulatory Visit (INDEPENDENT_AMBULATORY_CARE_PROVIDER_SITE_OTHER): Payer: Medicare PPO | Admitting: *Deleted

## 2011-03-26 ENCOUNTER — Encounter: Payer: Self-pay | Admitting: Internal Medicine

## 2011-03-26 DIAGNOSIS — I5022 Chronic systolic (congestive) heart failure: Secondary | ICD-10-CM

## 2011-03-26 DIAGNOSIS — I428 Other cardiomyopathies: Secondary | ICD-10-CM

## 2011-03-26 LAB — ICD DEVICE OBSERVATION
AL IMPEDENCE ICD: 512.5 Ohm
AL THRESHOLD: 0.375 V
ATRIAL PACING ICD: 55 pct
BAMS-0001: 150 {beats}/min
BAMS-0003: 70 {beats}/min
RV LEAD IMPEDENCE ICD: 550 Ohm
TOT-0006: 20100804000000
TOT-0008: 0
TOT-0009: 0
TZAT-0001FASTVT: 1
TZAT-0001SLOWVT: 1
TZAT-0004FASTVT: 8
TZAT-0004SLOWVT: 8
TZAT-0012FASTVT: 200 ms
TZAT-0012SLOWVT: 200 ms
TZAT-0013FASTVT: 1
TZAT-0019SLOWVT: 7.5 V
TZAT-0020FASTVT: 1 ms
TZON-0004FASTVT: 12
TZON-0004SLOWVT: 45
TZON-0005FASTVT: 6
TZON-0010SLOWVT: 80 ms
TZST-0001FASTVT: 3
TZST-0001FASTVT: 5
TZST-0001SLOWVT: 3
TZST-0003FASTVT: 36 J
TZST-0003FASTVT: 40 J
TZST-0003FASTVT: 40 J
TZST-0003SLOWVT: 36 J
TZST-0003SLOWVT: 40 J
VENTRICULAR PACING ICD: 0.17 pct

## 2011-03-26 NOTE — Progress Notes (Signed)
ICD check with ICM 

## 2011-03-27 ENCOUNTER — Encounter: Payer: Self-pay | Admitting: Cardiology

## 2011-04-10 ENCOUNTER — Ambulatory Visit: Payer: Medicare PPO | Admitting: Cardiology

## 2011-04-10 ENCOUNTER — Ambulatory Visit (INDEPENDENT_AMBULATORY_CARE_PROVIDER_SITE_OTHER): Payer: Medicare PPO | Admitting: Cardiology

## 2011-04-10 ENCOUNTER — Encounter: Payer: Self-pay | Admitting: Cardiology

## 2011-04-10 VITALS — BP 145/72 | HR 42 | Ht 62.0 in | Wt 182.0 lb

## 2011-04-10 DIAGNOSIS — I5022 Chronic systolic (congestive) heart failure: Secondary | ICD-10-CM

## 2011-04-10 DIAGNOSIS — R42 Dizziness and giddiness: Secondary | ICD-10-CM

## 2011-04-10 DIAGNOSIS — I428 Other cardiomyopathies: Secondary | ICD-10-CM

## 2011-04-10 DIAGNOSIS — I959 Hypotension, unspecified: Secondary | ICD-10-CM

## 2011-04-10 DIAGNOSIS — R0602 Shortness of breath: Secondary | ICD-10-CM

## 2011-04-10 NOTE — Progress Notes (Signed)
HPI The patient presents for followup of dyspnea.  As for evaluation of dizziness. She has had recent episodes of this with blood pressures that she records at times with systolics in the 80s. This is when she'll feel lightheaded. Other times however her systolic blood pressure can be in the 160s. It doesn't seem to be a pattern as I am reviewing her blood pressure diary. She does have heart rates recorded in the 40s despite her defibrillator. She's not had any syncope though she has again had lightheadedness. She is chronically dyspneic which is essentially unchanged. She's not describing any new PND or orthopnea. She's not describing any new palpitations. She's had stable weights and no new edema. She's had no chest pressure, neck or arm discomfort.  Allergies  Allergen Reactions  . Arthro-Complex   . Bacitracin   . Metoclopramide Hcl     Reglan  . Nabumetone     Relafen  . Nitroglycerin   . Nsaids   . Plaquenil (Hydroxychloroquine Sulfate)   . Triple Antibiotic     Current Outpatient Prescriptions  Medication Sig Dispense Refill  . Ascorbic Acid (VITAMIN C) 500 MG tablet Take 500 mg by mouth 2 (two) times daily.       Marland Kitchen aspirin 81 MG tablet Take 81 mg by mouth daily.        . Calcium Citrate-Vitamin D (CALCIUM CITRATE + D PO) Take by mouth.        . carvedilol (COREG) 3.125 MG tablet Take 3.125 mg by mouth 2 (two) times daily.        . diazepam (VALIUM) 5 MG tablet Take 5 mg by mouth daily.       . Diclofenac Epolamine (FLECTOR) 1.3 % PTCH as needed.        . docusate sodium (COLACE) 100 MG capsule at bedtime.       . furosemide (LASIX) 40 MG tablet Take 40 mg by mouth 2 (two) times daily.        Marland Kitchen gabapentin (NEURONTIN) 300 MG capsule Take 300 mg by mouth 2 (two) times daily.       . Glucosamine-Chondroitin (OSTEO BI-FLEX REGULAR STRENGTH PO) Take 2 tablets by mouth daily.        Marland Kitchen HYDROcodone-acetaminophen (LORTAB) 10-500 MG per tablet as needed.        . lansoprazole (PREVACID) 30  MG capsule Take 30 mg by mouth daily.        . Multiple Vitamin (MULTIVITAMIN) capsule Take 1 capsule by mouth daily.        . NON FORMULARY Mega Red daily       . oxybutynin (DITROPAN) 5 MG tablet Take 5 mg by mouth daily.        . pravastatin (PRAVACHOL) 40 MG tablet Take 40 mg by mouth daily.        . predniSONE (DELTASONE) 5 MG tablet Take 5 mg by mouth daily.        . Psyllium (VEGETABLE LAXATIVE PO) as needed.        Marland Kitchen tiZANidine (ZANAFLEX) 4 MG capsule Take by mouth 2 (two) times daily.       . Vitamin D, Ergocalciferol, (DRISDOL) 50000 UNITS CAPS Take 50,000 Units by mouth every 7 (seven) days.          Past Medical History  Diagnosis Date  . LBBB (left bundle branch block)   . Nonischemic cardiomyopathy     EF 25-30% echo 2010, 60% echo Nov 2001  . Hypertension   .  Diverticulitis   . ICD (implantable cardiac defibrillator) in place     CRT-D [employer]  . Chronic fatigue   . Fibromyalgia   . Shortness of breath     followed by Dr. Shelle Iron  . Elevated uric acid   . CRI (chronic renal insufficiency)     Past Surgical History  Procedure Date  . Crt-d-st. jude's   . Replacement total knee   . Abdominal hysterectomy   . Appendectomy   . Neck surgery     ROS: As stated in the HPI and negative for all other systems.  PHYSICAL EXAM BP 145/72  Pulse 42  Ht 5\' 2"  (1.575 m)  Wt 182 lb (82.555 kg)  BMI 33.29 kg/m2 GENERAL:  Well appearing HEENT:  Pupils equal round and reactive, fundi not visualized, oral mucosa unremarkable NECK:  No jugular venous distention, waveform within normal limits, carotid upstroke brisk and symmetric, no bruits, no thyromegaly LYMPHATICS:  No cervical, inguinal adenopathy LUNGS:  Clear to auscultation bilaterally BACK:  No CVA tenderness CHEST:  ICD intact HEART:  PMI not displaced or sustained,S1 and S2 within normal limits, no S3, no S4, no clicks, no rubs, no murmurs ABD:  Flat, positive bowel sounds normal in frequency in pitch, no  bruits, no rebound, no guarding, no midline pulsatile mass, no hepatomegaly, no splenomegaly EXT:  2 plus pulses throughout, no edema, no cyanosis no clubbing SKIN:  No rashes no nodules NEURO:  Cranial nerves II through XII grossly intact, motor grossly intact throughout PSYCH:  Cognitively intact, oriented to person place and time  EKG:  Atrial paced rhythm, rate 74, left bundle branch block, left axis deviation.  PVCs.  ASSESSMENT AND PLAN

## 2011-04-10 NOTE — Patient Instructions (Signed)
Please have blood work drawn today. (BNP)  Your physician has requested that you have an echocardiogram. Echocardiography is a painless test that uses sound waves to create images of your heart. It provides your doctor with information about the size and shape of your heart and how well your heart's chambers and valves are working. This procedure takes approximately one hour. There are no restrictions for this procedure.  Your physician has recommended that you wear an event monitor for 21 days. Event monitors are medical devices that record the heart's electrical activity. Doctors most often [place] these monitors to diagnose arrhythmias. Arrhythmias are problems with the speed or rhythm of the heartbeat. The monitor is a small, portable device. You can wear one while you do your normal daily activities. This is usually used to diagnose what is causing palpitations/syncope (passing out).  Follow up in 1 month after testing with Dr Antoine Poche.

## 2011-04-10 NOTE — Assessment & Plan Note (Signed)
She continues to have these episodes. She's had her device interrogated and it apparently has been functioning normally. I am going to put a 21 day event monitor to see if there's any correlation with dysrhythmias and hypotension.

## 2011-04-10 NOTE — Assessment & Plan Note (Signed)
Her last ejection fraction was well-preserved. However, with her increasing dyspnea and previous history I will check an echocardiogram and a BNP level.

## 2011-04-15 ENCOUNTER — Other Ambulatory Visit (HOSPITAL_COMMUNITY): Payer: Medicare PPO

## 2011-04-16 ENCOUNTER — Other Ambulatory Visit (HOSPITAL_COMMUNITY): Payer: Medicare PPO | Admitting: Radiology

## 2011-04-16 ENCOUNTER — Encounter (INDEPENDENT_AMBULATORY_CARE_PROVIDER_SITE_OTHER): Payer: Medicare HMO

## 2011-04-16 ENCOUNTER — Ambulatory Visit (HOSPITAL_COMMUNITY): Payer: Medicare HMO | Attending: Cardiology | Admitting: Radiology

## 2011-04-16 DIAGNOSIS — I359 Nonrheumatic aortic valve disorder, unspecified: Secondary | ICD-10-CM | POA: Insufficient documentation

## 2011-04-16 DIAGNOSIS — R0609 Other forms of dyspnea: Secondary | ICD-10-CM | POA: Insufficient documentation

## 2011-04-16 DIAGNOSIS — E785 Hyperlipidemia, unspecified: Secondary | ICD-10-CM | POA: Insufficient documentation

## 2011-04-16 DIAGNOSIS — R0989 Other specified symptoms and signs involving the circulatory and respiratory systems: Secondary | ICD-10-CM | POA: Insufficient documentation

## 2011-04-16 DIAGNOSIS — I079 Rheumatic tricuspid valve disease, unspecified: Secondary | ICD-10-CM | POA: Insufficient documentation

## 2011-04-16 DIAGNOSIS — R42 Dizziness and giddiness: Secondary | ICD-10-CM

## 2011-04-16 DIAGNOSIS — I379 Nonrheumatic pulmonary valve disorder, unspecified: Secondary | ICD-10-CM | POA: Insufficient documentation

## 2011-04-16 DIAGNOSIS — I059 Rheumatic mitral valve disease, unspecified: Secondary | ICD-10-CM | POA: Insufficient documentation

## 2011-04-16 DIAGNOSIS — I5022 Chronic systolic (congestive) heart failure: Secondary | ICD-10-CM | POA: Insufficient documentation

## 2011-04-29 HISTORY — PX: OTHER SURGICAL HISTORY: SHX169

## 2011-05-20 ENCOUNTER — Ambulatory Visit (INDEPENDENT_AMBULATORY_CARE_PROVIDER_SITE_OTHER): Payer: Medicare Other | Admitting: Cardiology

## 2011-05-20 ENCOUNTER — Encounter: Payer: Self-pay | Admitting: Cardiology

## 2011-05-20 DIAGNOSIS — I959 Hypotension, unspecified: Secondary | ICD-10-CM

## 2011-05-20 DIAGNOSIS — R0600 Dyspnea, unspecified: Secondary | ICD-10-CM

## 2011-05-20 DIAGNOSIS — R0989 Other specified symptoms and signs involving the circulatory and respiratory systems: Secondary | ICD-10-CM

## 2011-05-20 MED ORDER — CARVEDILOL 3.125 MG PO TABS: 3.1250 mg | ORAL_TABLET | Freq: Every day | ORAL | Status: DC

## 2011-05-20 NOTE — Assessment & Plan Note (Signed)
I am going to stop her carvedilol in the morning. We'll see if this helps with her profound weakness so I think this is probably not related to medications. I've asked her to go back and see her physician about her fibromyalgia.

## 2011-05-20 NOTE — Progress Notes (Signed)
HPI The patient presents for followup of dizziness. She has had multiple complaints including muscle aches and profound weakness. The weakness is the most predominant complaint. She denies chest pressure, neck or arm discomfort. I did have her keep a blood pressure diary and that demonstrated that in the mornings around 9:30 she often has systolics in the 90s and she feels very weak with this. I did have her wear an event monitor and she had no sustained or symptomatic dysrhythmias. However, she is quite limited now by generalized weakness and feels that her quality of life is severely inhibited.  Allergies  Allergen Reactions  . Arthro-Complex   . Bacitracin   . Metoclopramide Hcl     Reglan  . Nabumetone     Relafen  . Nitroglycerin   . Nsaids   . Plaquenil (Hydroxychloroquine Sulfate)   . Triple Antibiotic     Current Outpatient Prescriptions  Medication Sig Dispense Refill  . Ascorbic Acid (VITAMIN C) 500 MG tablet Take 500 mg by mouth 2 (two) times daily.       Marland Kitchen aspirin 81 MG tablet Take 81 mg by mouth daily.        . Calcium Citrate-Vitamin D (CALCIUM CITRATE + D PO) Take by mouth.        . carvedilol (COREG) 3.125 MG tablet Take 3.125 mg by mouth 2 (two) times daily.        . diazepam (VALIUM) 5 MG tablet Take 5 mg by mouth daily.       . Diclofenac Epolamine (FLECTOR) 1.3 % PTCH as needed.        . docusate sodium (COLACE) 100 MG capsule at bedtime.       . furosemide (LASIX) 40 MG tablet Take 40 mg by mouth 2 (two) times daily.        Marland Kitchen gabapentin (NEURONTIN) 300 MG capsule Take 300 mg by mouth 2 (two) times daily.       . Glucosamine-Chondroitin (OSTEO BI-FLEX REGULAR STRENGTH PO) Take 2 tablets by mouth daily.        Marland Kitchen HYDROcodone-acetaminophen (LORTAB) 10-500 MG per tablet as needed.        . lansoprazole (PREVACID) 30 MG capsule Take 30 mg by mouth daily.        . Multiple Vitamin (MULTIVITAMIN) capsule Take 1 capsule by mouth daily.        . NON FORMULARY Mega  Red daily       . oxybutynin (DITROPAN) 5 MG tablet Take 5 mg by mouth daily.        . pravastatin (PRAVACHOL) 40 MG tablet Take 40 mg by mouth daily.        . Psyllium (VEGETABLE LAXATIVE PO) as needed.        Marland Kitchen tiZANidine (ZANAFLEX) 4 MG capsule Take by mouth 2 (two) times daily.       . Vitamin D, Ergocalciferol, (DRISDOL) 50000 UNITS CAPS Take 50,000 Units by mouth every 7 (seven) days.          Past Medical History  Diagnosis Date  . LBBB (left bundle branch block)   . Nonischemic cardiomyopathy     EF 25-30% echo 2010, 60% echo Nov 2001  . Hypertension   . Diverticulitis   . ICD (implantable cardiac defibrillator) in place     CRT-D [employer]  . Chronic fatigue   . Fibromyalgia   . Shortness of breath     followed by Dr. Shelle Iron  . Elevated uric acid   .  CRI (chronic renal insufficiency)     Past Surgical History  Procedure Date  . Crt-d-st. jude's   . Replacement total knee   . Abdominal hysterectomy   . Appendectomy   . Neck surgery     ROS: As stated in the HPI and negative for all other systems.  PHYSICAL EXAM BP 130/75  Pulse 66  Ht 5\' 2"  (1.575 m)  Wt 183 lb (83.008 kg)  BMI 33.47 kg/m2 GENERAL:  Well appearing HEENT:  Pupils equal round and reactive, fundi not visualized, oral mucosa unremarkable NECK:  No jugular venous distention, waveform within normal limits, carotid upstroke brisk and symmetric, no bruits, no thyromegaly LYMPHATICS:  No cervical, inguinal adenopathy LUNGS:  Clear to auscultation bilaterally BACK:  No CVA tenderness CHEST:  ICD intact HEART:  PMI not displaced or sustained,S1 and S2 within normal limits, no S3, no S4, no clicks, no rubs, no murmurs ABD:  Flat, positive bowel sounds normal in frequency in pitch, no bruits, no rebound, no guarding, no midline pulsatile mass, no hepatomegaly, no splenomegaly EXT:  2 plus pulses throughout, no edema, no cyanosis no clubbing SKIN:  No rashes no nodules NEURO:  Cranial nerves II through  XII grossly intact, motor grossly intact throughout PSYCH:  Cognitively intact, oriented to person place and time  ASSESSMENT AND PLAN

## 2011-05-20 NOTE — Patient Instructions (Signed)
Please stop your am dose of Carvedilol Continue all other medications as listed  Follow up with Dr Antoine Poche in 1 month

## 2011-05-20 NOTE — Assessment & Plan Note (Signed)
She has had an extensive workup to include echocardiogram and CPX.  Her EF is moderately reduced at 35% but unchanged from previous. BNP recently was normal. Still she has severe dyspnea. At this point I don't see a cardiac etiology and don't anticipate further cardiac workup.

## 2011-06-13 ENCOUNTER — Encounter: Payer: Self-pay | Admitting: Cardiology

## 2011-06-23 ENCOUNTER — Ambulatory Visit (INDEPENDENT_AMBULATORY_CARE_PROVIDER_SITE_OTHER): Payer: Medicare Other | Admitting: *Deleted

## 2011-06-23 ENCOUNTER — Ambulatory Visit (INDEPENDENT_AMBULATORY_CARE_PROVIDER_SITE_OTHER): Payer: Medicare Other | Admitting: Cardiology

## 2011-06-23 ENCOUNTER — Encounter: Payer: Self-pay | Admitting: Cardiology

## 2011-06-23 ENCOUNTER — Other Ambulatory Visit: Payer: Self-pay | Admitting: Cardiology

## 2011-06-23 ENCOUNTER — Encounter: Payer: Self-pay | Admitting: Internal Medicine

## 2011-06-23 DIAGNOSIS — I428 Other cardiomyopathies: Secondary | ICD-10-CM

## 2011-06-23 DIAGNOSIS — I5022 Chronic systolic (congestive) heart failure: Secondary | ICD-10-CM

## 2011-06-23 DIAGNOSIS — R0600 Dyspnea, unspecified: Secondary | ICD-10-CM

## 2011-06-23 DIAGNOSIS — R0989 Other specified symptoms and signs involving the circulatory and respiratory systems: Secondary | ICD-10-CM

## 2011-06-23 DIAGNOSIS — R531 Weakness: Secondary | ICD-10-CM

## 2011-06-23 DIAGNOSIS — R5381 Other malaise: Secondary | ICD-10-CM

## 2011-06-23 LAB — ICD DEVICE OBSERVATION
AL IMPEDENCE ICD: 487.5 Ohm
AL THRESHOLD: 0.5 V
ATRIAL PACING ICD: 47 pct
BAMS-0001: 150 {beats}/min
BAMS-0003: 70 {beats}/min
DEVICE MODEL ICD: 718227
FVT: 0
PACEART VT: 0
RV LEAD AMPLITUDE: 12 mv
TOT-0006: 20100804000000
TZAT-0001FASTVT: 1
TZAT-0001SLOWVT: 1
TZAT-0004FASTVT: 8
TZAT-0004SLOWVT: 8
TZAT-0012FASTVT: 200 ms
TZAT-0019SLOWVT: 7.5 V
TZAT-0020FASTVT: 1 ms
TZAT-0020SLOWVT: 1 ms
TZON-0004SLOWVT: 45
TZON-0010SLOWVT: 80 ms
TZST-0001FASTVT: 3
TZST-0001FASTVT: 5
TZST-0001SLOWVT: 3
TZST-0001SLOWVT: 4
TZST-0003FASTVT: 36 J
TZST-0003SLOWVT: 36 J
TZST-0003SLOWVT: 40 J
VENTRICULAR PACING ICD: 0.12 pct

## 2011-06-23 MED ORDER — CARVEDILOL 3.125 MG PO TABS: 3.1250 mg | ORAL_TABLET | Freq: Every day | ORAL | Status: DC

## 2011-06-23 MED ORDER — FUROSEMIDE 40 MG PO TABS: 40.0000 mg | ORAL_TABLET | Freq: Two times a day (BID) | ORAL | Status: DC

## 2011-06-23 NOTE — Telephone Encounter (Signed)
New Refill   Patient called to refill RX, preferred pharmacy is [facility] phone [phone], Fax [phone], Patient ins # [phone].  She can be reached at hm# should you need additional information.    Carvedilol (COREG) 3.125 MG tablet   Furosemide (LASIX) 40 MG tablet

## 2011-06-23 NOTE — Patient Instructions (Signed)
Please take your first dose of Furosemide around 12N Continue all other medications as listed  Follow up with Dr Antoine Poche in 6 weeks

## 2011-06-23 NOTE — Assessment & Plan Note (Signed)
I'm still not clear the etiology of this. She consistently does have hypotension in the morning. She's been taking her Lasix at 7 in the morning and 7 at night. I'm going to try to move the Lasix timing to 1 PM and 7 PM to see if this does anything to the hypotensive episodes.  She will continue to keep her blood pressure diary.

## 2011-06-23 NOTE — Progress Notes (Signed)
ICD check with [facility] 

## 2011-06-23 NOTE — Progress Notes (Signed)
HPI The patient presents for followup of dizziness and hypotension. She still has profound weakness. She denies chest pressure, neck or arm discomfort. I did have her keep a blood pressure diary and that demonstrated that in the mornings around 9:30 she often has systolics in the 80s and she feels very weak with this.  At the last visit I stopped her beta blocker.  She is still having these low blood pressures.  Allergies  Allergen Reactions  . Arthro-Complex   . Bacitracin   . Metoclopramide Hcl     Reglan  . Nabumetone     Relafen  . Nitroglycerin   . Nsaids   . Plaquenil (Hydroxychloroquine Sulfate)   . Triple Antibiotic     Current Outpatient Prescriptions  Medication Sig Dispense Refill  . Ascorbic Acid (VITAMIN C) 500 MG tablet Take 500 mg by mouth 2 (two) times daily.       Marland Kitchen aspirin 81 MG tablet Take 81 mg by mouth daily.        Marland Kitchen azaTHIOprine (IMURAN) 50 MG tablet Take 50 mg by mouth daily.      . Calcium Citrate-Vitamin D (CALCIUM CITRATE + D PO) Take by mouth.        . carvedilol (COREG) 3.125 MG tablet Take 1 tablet (3.125 mg total) by mouth daily.      . diazepam (VALIUM) 5 MG tablet Take 5 mg by mouth daily.       . Diclofenac Epolamine (FLECTOR) 1.3 % PTCH as needed.        . docusate sodium (COLACE) 100 MG capsule at bedtime.       . furosemide (LASIX) 40 MG tablet Take 40 mg by mouth 2 (two) times daily.        Marland Kitchen gabapentin (NEURONTIN) 300 MG capsule Take 300 mg by mouth 2 (two) times daily.       . Glucosamine-Chondroitin (OSTEO BI-FLEX REGULAR STRENGTH PO) Take 2 tablets by mouth daily.        Marland Kitchen HYDROcodone-acetaminophen (LORTAB) 10-500 MG per tablet as needed.        . lansoprazole (PREVACID) 30 MG capsule Take 30 mg by mouth daily.        . Multiple Vitamin (MULTIVITAMIN) capsule Take 1 capsule by mouth daily.        . NON FORMULARY Mega Red daily       . oxybutynin (DITROPAN) 5 MG tablet Take 5 mg by mouth daily.        . pravastatin (PRAVACHOL) 40 MG  tablet Take 40 mg by mouth daily.        . Psyllium (VEGETABLE LAXATIVE PO) as needed.        Marland Kitchen tiZANidine (ZANAFLEX) 4 MG capsule Take by mouth 2 (two) times daily.       . Vitamin D, Ergocalciferol, (DRISDOL) 50000 UNITS CAPS Take 50,000 Units by mouth every 7 (seven) days.          Past Medical History  Diagnosis Date  . LBBB (left bundle branch block)   . Nonischemic cardiomyopathy     EF 25-30% echo 2010, 45% echo 2012  . Hypertension   . Diverticulitis   . ICD (implantable cardiac defibrillator) in place     CRT-D [employer]  . Chronic fatigue   . Fibromyalgia   . Shortness of breath     followed by Dr. Shelle Iron  . Elevated uric acid   . CRI (chronic renal insufficiency)     Past Surgical  History  Procedure Date  . Crt-d-st. jude's   . Replacement total knee   . Abdominal hysterectomy   . Appendectomy   . Neck surgery     ROS: As stated in the HPI and negative for all other systems.  PHYSICAL EXAM BP 130/80  Pulse 72  Ht 5\' 2"  (1.575 m)  Wt 81.194 kg (179 lb)  BMI 32.74 kg/m2 GENERAL:  Well appearing HEENT:  Pupils equal round and reactive, fundi not visualized, oral mucosa unremarkable NECK:  No jugular venous distention, waveform within normal limits, carotid upstroke brisk and symmetric, no bruits, no thyromegaly LYMPHATICS:  No cervical, inguinal adenopathy LUNGS:  Clear to auscultation bilaterally BACK:  No CVA tenderness CHEST:  ICD intact HEART:  PMI not displaced or sustained,S1 and S2 within normal limits, no S3, no S4, no clicks, no rubs, no murmurs ABD:  Flat, positive bowel sounds normal in frequency in pitch, no bruits, no rebound, no guarding, no midline pulsatile mass, no hepatomegaly, no splenomegaly EXT:  2 plus pulses throughout, no edema, no cyanosis no clubbing SKIN:  No rashes no nodules NEURO:  Cranial nerves II through XII grossly intact, motor grossly intact throughout PSYCH:  Cognitively intact, oriented to person place and  time  ASSESSMENT AND PLAN

## 2011-06-23 NOTE — Assessment & Plan Note (Signed)
This is at baseline and probably multifactorial.  She has had an extensive work up.

## 2011-06-23 NOTE — Assessment & Plan Note (Signed)
She seems to be euvolemic.  At this point, no change in therapy other than as indicated.  We have reviewed salt and fluid restrictions.  No further cardiovascular testing is indicated.

## 2011-06-25 ENCOUNTER — Encounter: Payer: Medicare PPO | Admitting: *Deleted

## 2011-06-25 ENCOUNTER — Encounter: Payer: Self-pay | Admitting: Cardiology

## 2011-07-08 ENCOUNTER — Telehealth: Payer: Self-pay | Admitting: Pulmonary Disease

## 2011-07-08 NOTE — Telephone Encounter (Signed)
I spoke with the pt and she states that she has her 1 year appt in April with Dr. Shelle Iron for sleep. She states she has not had a download done in a while and wants to know if this needs to be ordered before she comes in for OV. Please advise if you would like download. Carron Curie, CMA

## 2011-07-08 NOTE — Telephone Encounter (Signed)
If she has a resmed machine, just have her bring to visit along with power cord and we can download here.

## 2011-07-09 NOTE — Telephone Encounter (Signed)
Spoke with pt and notified of recs per [facility]. She checked and her machine is resmed and so she will bring this with her to the visit. Nothing further needed per pt.

## 2011-07-10 ENCOUNTER — Telehealth: Payer: Self-pay | Admitting: Cardiology

## 2011-07-10 NOTE — Telephone Encounter (Signed)
Pt calling with update on med

## 2011-07-11 NOTE — Telephone Encounter (Signed)
Per pt - changing the times she was taking her Lasix only caused her to have to go to the bathroom more at night.  She reports that her BP continues to drop as before.  Will notify Dr Antoine Poche

## 2011-08-04 ENCOUNTER — Telehealth: Payer: Self-pay | Admitting: Cardiology

## 2011-08-04 MED ORDER — FUROSEMIDE 40 MG PO TABS: 40.0000 mg | ORAL_TABLET | Freq: Two times a day (BID) | ORAL | Status: DC

## 2011-08-04 NOTE — Telephone Encounter (Signed)
New Problem:    Patient called in needing a refill of her furosemide (LASIX) 40 MG tablet filled with [facility] on [address]. Patient did not have the number to [facility], claimed that Pam would have her info. Please call back if you have any questions.

## 2011-08-04 NOTE — Telephone Encounter (Signed)
Addended by: Sharin Grave on: 08/04/2011 10:18 AM   Modules accepted: Orders

## 2011-08-07 ENCOUNTER — Encounter: Payer: Self-pay | Admitting: Cardiology

## 2011-08-07 ENCOUNTER — Ambulatory Visit (INDEPENDENT_AMBULATORY_CARE_PROVIDER_SITE_OTHER): Payer: Medicare Other | Admitting: Cardiology

## 2011-08-07 VITALS — BP 150/89 | HR 66 | Ht 62.0 in | Wt 176.6 lb

## 2011-08-07 DIAGNOSIS — I959 Hypotension, unspecified: Secondary | ICD-10-CM

## 2011-08-07 DIAGNOSIS — I452 Bifascicular block: Secondary | ICD-10-CM

## 2011-08-07 DIAGNOSIS — I5022 Chronic systolic (congestive) heart failure: Secondary | ICD-10-CM

## 2011-08-07 NOTE — Assessment & Plan Note (Signed)
She seems to be euvolemic.  At this point, no change in therapy is indicated.  We have reviewed salt and fluid restrictions.  No further cardiovascular testing is indicated. I don't think she'll tolerated titration of her medications.

## 2011-08-07 NOTE — Assessment & Plan Note (Signed)
At this point I don't have a solution for these episodes other than avoidance of situations that might lead to syncope when she has her hypotension. No further testing is suggested. She will continue the medications as listed.

## 2011-08-07 NOTE — Progress Notes (Signed)
HPI The patient presents for followup of dizziness and hypotension. She still has profound weakness. Over several visits I have tried to manipulate her medications to avoid hypotensive episodes that she gets in the morning. However, she continues to have these. She suggested to them by simply lying down and going to sleep most mornings. She's not having any presyncope or syncope. She's having no new chest pressure, neck or arm discomfort. She's had some blood work positive for TB and she is being considered for INH therapy.  Allergies  Allergen Reactions  . Arthro-Complex   . Bacitracin   . Imuran (Azathioprine)   . Metoclopramide Hcl     Reglan  . Nabumetone     Relafen  . Nitroglycerin   . Nsaids   . Plaquenil (Hydroxychloroquine Sulfate)   . Triple Antibiotic     Current Outpatient Prescriptions  Medication Sig Dispense Refill  . Ascorbic Acid (VITAMIN C) 500 MG tablet Take 500 mg by mouth 2 (two) times daily.       Marland Kitchen aspirin 81 MG tablet Take 81 mg by mouth daily.        . Calcium Citrate-Vitamin D (CALCIUM CITRATE + D PO) Take by mouth.        . carvedilol (COREG) 3.125 MG tablet Take 1 tablet (3.125 mg total) by mouth daily.  90 tablet  2  . diazepam (VALIUM) 5 MG tablet Take 5 mg by mouth daily.       . Diclofenac Epolamine (FLECTOR) 1.3 % PTCH as needed.        . docusate sodium (COLACE) 100 MG capsule at bedtime.       . furosemide (LASIX) 40 MG tablet Take 1 tablet (40 mg total) by mouth 2 (two) times daily.  180 tablet  2  . gabapentin (NEURONTIN) 300 MG capsule Take 300 mg by mouth 2 (two) times daily.       . Glucosamine-Chondroitin (OSTEO BI-FLEX REGULAR STRENGTH PO) Take 2 tablets by mouth daily.        Marland Kitchen HYDROcodone-acetaminophen (LORTAB) 10-500 MG per tablet as needed.        . lansoprazole (PREVACID) 30 MG capsule Take 30 mg by mouth daily.        . Multiple Vitamin (MULTIVITAMIN) capsule Take 1 capsule by mouth daily.        . NON FORMULARY Mega Red daily        . oxybutynin (DITROPAN) 5 MG tablet Take 5 mg by mouth daily.        . pravastatin (PRAVACHOL) 40 MG tablet Take 40 mg by mouth daily.        . prednisoLONE 5 MG TABS Take 5 mg by mouth daily.      . Psyllium (VEGETABLE LAXATIVE PO) as needed.        Marland Kitchen tiZANidine (ZANAFLEX) 4 MG capsule Take by mouth 2 (two) times daily.       Marland Kitchen triamcinolone cream (KENALOG) 0.1 % Apply topically 2 (two) times daily.      . Vitamin D, Ergocalciferol, (DRISDOL) 50000 UNITS CAPS Take 50,000 Units by mouth every 7 (seven) days.          Past Medical History  Diagnosis Date  . LBBB (left bundle branch block)   . Nonischemic cardiomyopathy     EF 25-30% echo 2010, 45% echo 2012  . Hypertension   . Diverticulitis   . ICD (implantable cardiac defibrillator) in place     CRT-D [employer]  .  Chronic fatigue   . Fibromyalgia   . Shortness of breath     followed by Dr. Shelle Iron  . Elevated uric acid   . CRI (chronic renal insufficiency)     Past Surgical History  Procedure Date  . Crt-d-st. jude's   . Replacement total knee   . Abdominal hysterectomy   . Appendectomy   . Neck surgery     ROS: As stated in the HPI and negative for all other systems.  PHYSICAL EXAM BP 150/89  Pulse 66  Ht 5\' 2"  (1.575 m)  Wt 176 lb 9.6 oz (80.105 kg)  BMI 32.30 kg/m2 GENERAL:  Well appearing NECK:  No jugular venous distention, waveform within normal limits, carotid upstroke brisk and symmetric, no bruits, no thyromegaly LUNGS:  Clear to auscultation bilaterally BACK:  No CVA tenderness CHEST:  ICD intact HEART:  PMI not displaced or sustained,S1 and S2 within normal limits, no S3, no S4, no clicks, no rubs, no murmurs ABD:  Flat, positive bowel sounds normal in frequency in pitch, no bruits, no rebound, no guarding, no midline pulsatile mass, no hepatomegaly, no splenomegaly EXT:  2 plus pulses throughout, no edema, no cyanosis no clubbing  EKG:  Sinus rhythm, rate 66, left bundle branch block  08/07/2011  ASSESSMENT AND PLAN

## 2011-08-07 NOTE — Patient Instructions (Signed)
The current medical regimen is effective;  continue present plan and medications.  Follow up in 6 months with Dr Hochrein.  You will receive a letter in the mail 2 months before you are due.  Please call us when you receive this letter to schedule your follow up appointment.  

## 2011-08-15 ENCOUNTER — Encounter: Payer: Self-pay | Admitting: Pulmonary Disease

## 2011-08-15 ENCOUNTER — Ambulatory Visit (INDEPENDENT_AMBULATORY_CARE_PROVIDER_SITE_OTHER): Payer: Medicare Other | Admitting: Pulmonary Disease

## 2011-08-15 VITALS — BP 122/80 | HR 60 | Temp 98.8°F | Ht 62.0 in | Wt 178.4 lb

## 2011-08-15 DIAGNOSIS — R7611 Nonspecific reaction to tuberculin skin test without active tuberculosis: Secondary | ICD-10-CM | POA: Insufficient documentation

## 2011-08-15 DIAGNOSIS — R0989 Other specified symptoms and signs involving the circulatory and respiratory systems: Secondary | ICD-10-CM

## 2011-08-15 DIAGNOSIS — R0600 Dyspnea, unspecified: Secondary | ICD-10-CM

## 2011-08-15 NOTE — Progress Notes (Signed)
  Subjective:    Patient ID: Heather Bullock, female    DOB: 1928/08/28, 78 y.o.   MRN: [id number]  HPI The patient comes in today for followup of her known neuromuscular weakness with nocturnal dyspnea.  She has been on bilevel in order to give her tidal volume support, and has done well.  She is wearing the device compliantly by download, and feels that it helps her sleep significantly.   Review of Systems  Constitutional: Negative for fever and unexpected weight change.  HENT: Positive for trouble swallowing. Negative for ear pain, nosebleeds, congestion, sore throat, rhinorrhea, sneezing, dental problem, postnasal drip and sinus pressure.   Eyes: Negative for redness and itching.  Respiratory: Positive for cough and shortness of breath. Negative for chest tightness.   Cardiovascular: Positive for leg swelling. Negative for palpitations.  Gastrointestinal: Negative for nausea and vomiting.  Genitourinary: Negative for dysuria.  Musculoskeletal: Negative for joint swelling.  Skin: Positive for rash.  Neurological: Negative for headaches.  Hematological: Does not bruise/bleed easily.  Psychiatric/Behavioral: Negative for dysphoric mood. The patient is not nervous/anxious.        Objective:   Physical Exam Overweight female in no acute distress No skin breakdown or pressure necrosis from the CPAP mask Minimal lower extremity edema, no cyanosis Alert and oriented, moves all 4 extremities.       Assessment & Plan:

## 2011-08-15 NOTE — Patient Instructions (Signed)
Continue on bipap at night. Try and work on weight loss If doing well, followup with me in one year.

## 2011-08-15 NOTE — Assessment & Plan Note (Signed)
The patient is very compliant with her bilevel device by her downloaded today, and continues to feel it is helping her sleep and breathing at night.  She is having no issues with the mask fit or the device itself.  I have asked her to continue on her current settings, and to work aggressively on weight loss as able.  I will see her back in one year if doing well.

## 2011-08-15 NOTE — Assessment & Plan Note (Signed)
The patient has a recent positive PPD, but a clear chest x-ray.  She is currently on no aggressive immunosuppressive medications from rheumatology, but if the decision is made to put her on these, she may need a course of INH prophylaxis.

## 2011-09-11 ENCOUNTER — Telehealth: Payer: Self-pay | Admitting: Cardiology

## 2011-09-11 ENCOUNTER — Other Ambulatory Visit: Payer: Self-pay | Admitting: Family Medicine

## 2011-09-11 DIAGNOSIS — Z1231 Encounter for screening mammogram for malignant neoplasm of breast: Secondary | ICD-10-CM

## 2011-09-11 NOTE — Telephone Encounter (Signed)
Please return call to patient at [phone]  Patient BP has been dropping and patient think is may a complication of some of her medication.  Patient request return call at [phone]

## 2011-09-11 NOTE — Telephone Encounter (Signed)
Pt found out that the BP problems she was having was coming from the Tizanidine.  She has stopped it since Monday.  BP's are running 114/79 to 112/65.  She wants to know if she needs increase her carvedilol to twice a day.  It was decreased to see if the hypotension was coming from the beta-blocker or not.  Will review with Dr Antoine Poche.

## 2011-09-15 NOTE — Telephone Encounter (Signed)
I would like to have her increase to bid.

## 2011-09-15 NOTE — Telephone Encounter (Signed)
Pt aware to increase carvedilol to twice a day She feels much better now that BP is not dropping like it was

## 2011-09-24 ENCOUNTER — Ambulatory Visit (INDEPENDENT_AMBULATORY_CARE_PROVIDER_SITE_OTHER): Payer: Medicare Other | Admitting: *Deleted

## 2011-09-24 ENCOUNTER — Encounter: Payer: Self-pay | Admitting: Internal Medicine

## 2011-09-24 DIAGNOSIS — I5022 Chronic systolic (congestive) heart failure: Secondary | ICD-10-CM

## 2011-09-24 DIAGNOSIS — I447 Left bundle-branch block, unspecified: Secondary | ICD-10-CM

## 2011-09-24 LAB — ICD DEVICE OBSERVATION
AL THRESHOLD: 0.5 V
ATRIAL PACING ICD: 51 pct
BAMS-0003: 70 {beats}/min
DEVICE MODEL ICD: 718227
MODE SWITCH EPISODES: 0
PACEART VT: 0
RV LEAD THRESHOLD: 0.625 V
TOT-0006: 20100804000000
TZAT-0001FASTVT: 1
TZAT-0001SLOWVT: 1
TZAT-0013FASTVT: 1
TZAT-0019SLOWVT: 7.5 V
TZAT-0020SLOWVT: 1 ms
TZON-0005SLOWVT: 6
TZON-0010SLOWVT: 80 ms
TZST-0001FASTVT: 2
TZST-0001FASTVT: 5
TZST-0001SLOWVT: 2
TZST-0001SLOWVT: 4
TZST-0003FASTVT: 36 J
TZST-0003FASTVT: 40 J
TZST-0003FASTVT: 40 J
TZST-0003SLOWVT: 36 J

## 2011-09-24 NOTE — Progress Notes (Signed)
ICD check 

## 2011-10-13 ENCOUNTER — Ambulatory Visit: Payer: Medicare Other

## 2011-10-22 ENCOUNTER — Ambulatory Visit
Admission: RE | Admit: 2011-10-22 | Discharge: 2011-10-22 | Disposition: A | Discharge status: Home / Self Care | Payer: Medicare Other | Source: Ambulatory Visit | Attending: Family Medicine | Admitting: Family Medicine

## 2011-10-22 DIAGNOSIS — Z1231 Encounter for screening mammogram for malignant neoplasm of breast: Secondary | ICD-10-CM

## 2011-11-18 ENCOUNTER — Telehealth: Payer: Self-pay | Admitting: Cardiology

## 2011-11-18 MED ORDER — CARVEDILOL 3.125 MG PO TABS: 3.1250 mg | ORAL_TABLET | Freq: Two times a day (BID) | ORAL | Status: DC

## 2011-11-18 NOTE — Telephone Encounter (Signed)
Pt calling re rx through [facility], carvedilol , needs new rx called in with the change from 1 a day to two a day

## 2011-11-18 NOTE — Telephone Encounter (Signed)
..   Requested Prescriptions   Signed Prescriptions Disp Refills  . carvedilol (COREG) 3.125 MG tablet 60 tablet 6    Sig: Take 1 tablet (3.125 mg total) by mouth 2 (two) times daily with a meal.    Authorizing Provider: Rollene Rotunda    Ordering User: Aylan Bayona M  change on file in the 09/11/11 notes by Elita Quick

## 2011-12-25 ENCOUNTER — Telehealth: Payer: Self-pay | Admitting: Pulmonary Disease

## 2011-12-25 NOTE — Telephone Encounter (Signed)
I spoke with pt and she stated she has noticed increase SOB. I scheduled her to come in and see [facility] tomorrow morning at 9:!5 since he was not here this afternoon. Nothing further was needed

## 2011-12-26 ENCOUNTER — Other Ambulatory Visit (INDEPENDENT_AMBULATORY_CARE_PROVIDER_SITE_OTHER): Payer: Medicare Other

## 2011-12-26 ENCOUNTER — Encounter: Payer: Self-pay | Admitting: Pulmonary Disease

## 2011-12-26 ENCOUNTER — Encounter (HOSPITAL_COMMUNITY): Payer: Medicare Other

## 2011-12-26 ENCOUNTER — Ambulatory Visit (INDEPENDENT_AMBULATORY_CARE_PROVIDER_SITE_OTHER): Payer: Medicare Other | Admitting: Pulmonary Disease

## 2011-12-26 ENCOUNTER — Ambulatory Visit (INDEPENDENT_AMBULATORY_CARE_PROVIDER_SITE_OTHER)
Admission: RE | Admit: 2011-12-26 | Discharge: 2011-12-26 | Disposition: A | Discharge status: Home / Self Care | Payer: Medicare Other | Source: Ambulatory Visit | Attending: Pulmonary Disease | Admitting: Pulmonary Disease

## 2011-12-26 VITALS — BP 128/80 | HR 63 | Temp 97.6°F | Ht 62.0 in | Wt 177.8 lb

## 2011-12-26 DIAGNOSIS — R0600 Dyspnea, unspecified: Secondary | ICD-10-CM

## 2011-12-26 DIAGNOSIS — R0989 Other specified symptoms and signs involving the circulatory and respiratory systems: Secondary | ICD-10-CM

## 2011-12-26 DIAGNOSIS — R0609 Other forms of dyspnea: Secondary | ICD-10-CM

## 2011-12-26 NOTE — Assessment & Plan Note (Signed)
The patient is having worsening shortness of breath, and it is unclear if this is related to her cardiomyopathy and heart failure, or whether she may be having worsening neuromuscular weakness.  I would also consider the possibility of thromboembolic disease, but her history and oxygen saturation today make this less likely.  Will check a chest x-ray today, and also blood work.  She has an appointment with her cardiologist the beginning of next week.  I would also like to recheck pulmonary function studies to see if her lung volumes are significantly decreasing.  If she has worsening of her breathing over the weekend, she is to go to the emergency room.  She is in no acute distress currently, and has adequate oxygen saturations.

## 2011-12-26 NOTE — Progress Notes (Signed)
  Subjective:    Patient ID: Heather Bullock, female    DOB: Jun 08, 1928, 78 y.o.   MRN: [id number]  HPI Patient comes in today for an acute sick visit.  She has known neuromuscular weakness of unknown origin, and wears bilevel at night as a respiratory assist device.  She also has a known cardiomyopathy with episodic heart failure.  She comes in today with a one-week history of worsening shortness of breath with exertion and some at rest.  She also notes some intermittent chest tightness that is not necessarily exertional in origin.  She denies any pleuritic chest pain or leg pain.  She has had a mild dry cough, but no chest congestion or mucus.  She has no history of obstructive airways disease by PFTs in the past.  Her weight is actually down a pound from her last visit, but she does have persistent lower extremity edema.  She is unsure if her muscle weakness has got any worse over the last one week.  She has an appointment with her cardiologist beginning of next week.   Review of Systems  Constitutional: Negative for fever and unexpected weight change.  HENT: Negative for ear pain, nosebleeds, congestion, sore throat, rhinorrhea, sneezing, trouble swallowing, dental problem, postnasal drip and sinus pressure.   Eyes: Negative for redness and itching.  Respiratory: Positive for cough, shortness of breath and wheezing. Negative for chest tightness.   Cardiovascular: Negative for palpitations and leg swelling.  Gastrointestinal: Negative for nausea and vomiting.  Genitourinary: Negative for dysuria.  Musculoskeletal: Negative for joint swelling.  Skin: Negative for rash.  Neurological: Negative for headaches.  Hematological: Does not bruise/bleed easily.  Psychiatric/Behavioral: Negative for dysphoric mood. The patient is not nervous/anxious.        Objective:   Physical Exam Overweight female in no acute distress Nose without purulence or discharge noted Oropharynx clear Neck without  lymphadenopathy or thyromegaly Chest with adequate airflow, no wheezes or rhonchi, mild basilar crackles. Cardiac exam with regular rate and rhythm Lower extremities with 1+ edema bilaterally, no cyanosis Alert and oriented, moves all 4 extremities.       Assessment & Plan:

## 2011-12-26 NOTE — Patient Instructions (Addendum)
Will check a cxr today, as well as bloodwork.  Will call you with results.  Will schedule for breathing tests at [facility], and let you know the results. Keep your apptm with cardiology early next week If your breathing worsens over the weekend, will need to go to ER.

## 2011-12-30 ENCOUNTER — Encounter: Payer: Self-pay | Admitting: Internal Medicine

## 2011-12-30 ENCOUNTER — Telehealth: Payer: Self-pay | Admitting: Pulmonary Disease

## 2011-12-30 ENCOUNTER — Ambulatory Visit (INDEPENDENT_AMBULATORY_CARE_PROVIDER_SITE_OTHER): Payer: Medicare Other | Admitting: Internal Medicine

## 2011-12-30 VITALS — BP 127/72 | HR 66 | Ht 62.0 in | Wt 173.0 lb

## 2011-12-30 DIAGNOSIS — I5022 Chronic systolic (congestive) heart failure: Secondary | ICD-10-CM

## 2011-12-30 DIAGNOSIS — R0609 Other forms of dyspnea: Secondary | ICD-10-CM

## 2011-12-30 DIAGNOSIS — R0600 Dyspnea, unspecified: Secondary | ICD-10-CM

## 2011-12-30 DIAGNOSIS — Z95810 Presence of automatic (implantable) cardiac defibrillator: Secondary | ICD-10-CM

## 2011-12-30 DIAGNOSIS — I428 Other cardiomyopathies: Secondary | ICD-10-CM

## 2011-12-30 LAB — ICD DEVICE OBSERVATION
AL AMPLITUDE: 1.3 mv
AL IMPEDENCE ICD: 525 Ohm
AL THRESHOLD: 0.5 V
ATRIAL PACING ICD: 42 pct
BAMS-0003: 70 {beats}/min
DEVICE MODEL ICD: 718227
FVT: 0
PACEART VT: 0
RV LEAD AMPLITUDE: 12 mv
RV LEAD IMPEDENCE ICD: 537.5 Ohm
TOT-0008: 0
TOT-0009: 0
TZAT-0001FASTVT: 1
TZAT-0004SLOWVT: 8
TZAT-0018SLOWVT: NEGATIVE
TZAT-0019FASTVT: 7.5 V
TZAT-0019SLOWVT: 7.5 V
TZAT-0020FASTVT: 1 ms
TZAT-0020SLOWVT: 1 ms
TZON-0003SLOWVT: 350 ms
TZON-0004FASTVT: 12
TZON-0004SLOWVT: 45
TZON-0005FASTVT: 6
TZON-0005SLOWVT: 6
TZON-0010FASTVT: 80 ms
TZON-0010SLOWVT: 80 ms
TZST-0001FASTVT: 2
TZST-0001FASTVT: 4
TZST-0001FASTVT: 5
TZST-0001SLOWVT: 3
TZST-0001SLOWVT: 4
TZST-0003FASTVT: 36 J
TZST-0003FASTVT: 40 J
TZST-0003SLOWVT: 40 J
VF: 0

## 2011-12-30 NOTE — Assessment & Plan Note (Signed)
Chronic and not  Improved  Following with Clance and euvolemic

## 2011-12-30 NOTE — Assessment & Plan Note (Signed)
The patient's device was interrogated.  The information was reviewed. No changes were made in the programming.    

## 2011-12-30 NOTE — Patient Instructions (Signed)
Your physician recommends that you schedule a follow-up appointment in: 3 months with the device clinic.  Your physician wants you to follow-up in: 1 year with Dr Graciela Husbands.  You will receive a reminder letter in the mail two months in advance. If you don't receive a letter, please call our office to schedule the follow-up appointment.

## 2011-12-30 NOTE — Telephone Encounter (Signed)
Spoke with pt.  Informed her of below per Dr. Shelle Iron.  She verbalized understanding of this.  Nothing further needed at this time.

## 2011-12-30 NOTE — Progress Notes (Signed)
HPI Heather Bullock  is seen in followup for an ICD implanted initially as part of CRTassessment discontinuation of the LV lead. There is initial improvement in her symptoms and then she has been challenged by dyspnea. She has been seen by multiple specialists for this. There is some question as to whether she had neuromuscular disease.  It is described in the chart as "of unknown origin".. She continues to have problems with shortness of breath this has been worse over the last few days. She saw Clance. over pulmonary last w and he is undertaken for testing last orweek     Allergies  Allergen Reactions  . Bacitracin   . Imuran (Azathioprine)   . Metoclopramide Hcl     Reglan  . Nabumetone     Relafen  . Neomycin-Bacitracin Zn-Polymyx   . Nitroglycerin   . Nsaids   . Nutritional Supplements   . Plaquenil (Hydroxychloroquine Sulfate)     Current Outpatient Prescriptions  Medication Sig Dispense Refill  . Acetaminophen (TYLENOL EXTRA STRENGTH PO) Take 650 mg by mouth as needed.      . Ascorbic Acid (VITAMIN C) 500 MG tablet Take 1,000 mg by mouth daily.       Marland Kitchen aspirin 81 MG tablet Take 81 mg by mouth daily.        . Calcium Citrate-Vitamin D (CALCIUM CITRATE + D PO) Take by mouth daily.       . carvedilol (COREG) 3.125 MG tablet Take 1 tablet (3.125 mg total) by mouth 2 (two) times daily with a meal.  60 tablet  6  . diazepam (VALIUM) 5 MG tablet Take 5 mg by mouth daily.       . Diclofenac Epolamine (FLECTOR) 1.3 % PTCH as needed.        . docusate sodium (COLACE) 100 MG capsule daily.       . furosemide (LASIX) 40 MG tablet Take 1 tablet (40 mg total) by mouth 2 (two) times daily.  180 tablet  2  . gabapentin (NEURONTIN) 300 MG capsule Take 300 mg by mouth 2 (two) times daily.       . Glucosamine-Chondroitin (OSTEO BI-FLEX REGULAR STRENGTH PO) Take 2 tablets by mouth daily.        Marland Kitchen HYDROcodone-acetaminophen (LORTAB) 10-500 MG per tablet as needed.        . lansoprazole (PREVACID) 30  MG capsule Take 30 mg by mouth daily.        . Multiple Vitamin (MULTIVITAMIN) capsule Take 1 capsule by mouth daily.        . NON FORMULARY Mega Red daily       . oxybutynin (DITROPAN) 5 MG tablet Take 5 mg by mouth daily.        . pravastatin (PRAVACHOL) 40 MG tablet Take 40 mg by mouth daily.        . Psyllium (VEGETABLE LAXATIVE PO) as needed.        . triamcinolone cream (KENALOG) 0.1 % As needed      . Vitamin D, Ergocalciferol, (DRISDOL) 50000 UNITS CAPS Take 50,000 Units by mouth every 7 (seven) days.          Past Medical History  Diagnosis Date  . LBBB (left bundle branch block)   . Nonischemic cardiomyopathy     EF 25-30% echo 2010, 45% echo 2012  . Hypertension   . Diverticulitis   . ICD (implantable cardiac defibrillator) in place     CRT-D [employer]  . Chronic fatigue   .  Fibromyalgia   . Shortness of breath     followed by Dr. Shelle Iron  . Elevated uric acid   . CRI (chronic renal insufficiency)     Past Surgical History  Procedure Date  . Crt-d-st. jude's   . Replacement total knee   . Abdominal hysterectomy   . Appendectomy   . Neck surgery     ROS: As stated in the HPI and negative for all other systems.  PHYSICAL EXAM BP 127/72  Pulse 66  Ht 5\' 2"  (1.575 m)  Wt 173 lb (78.472 kg)  BMI 31.64 kg/m2 GENERAL:  Well appearing HEENT normal  J{V P flat LUNGS:  Clear to auscultation bilaterally BACK:  No CVA tenderness CHEST:  ICD pocket well-healed HEART:  PMI not displaced or sustained,S1 and S2 within normal limits, Without murmurs or gallops ABD:  Soft with active bowel sounds EXT:  2 plus pulses throughout, no edema, no cyanosis no clubbing SKIN:  No rashes no nodules   PSYCH:  Cognitively intact,     ASSESSMENT AND PLAN

## 2011-12-30 NOTE — Progress Notes (Signed)
Quick Note:  Spoke with pt. I informed her of cxr and lab results and recs per [facility]. She verbalized understanding of these results and recs. Nothing further needed at this time. ______

## 2011-12-30 NOTE — Telephone Encounter (Signed)
Notes Recorded by Barbaraann Share, MD on 12/26/2011 at 3:23 PM Let her know that cxr does not show any change from prior. Her bloodwork does not indicate worsening heart failure, but is just a guide. Lets see what your breathing tests show next week.  --[id number] x1

## 2011-12-30 NOTE — Assessment & Plan Note (Signed)
Stable. euvolemic

## 2012-01-02 ENCOUNTER — Ambulatory Visit (HOSPITAL_COMMUNITY)
Admission: RE | Admit: 2012-01-02 | Discharge: 2012-01-02 | Disposition: A | Discharge status: Home / Self Care | Payer: Medicare Other | Source: Ambulatory Visit | Attending: Pulmonary Disease | Admitting: Pulmonary Disease

## 2012-01-02 DIAGNOSIS — R0609 Other forms of dyspnea: Secondary | ICD-10-CM | POA: Insufficient documentation

## 2012-01-02 DIAGNOSIS — R059 Cough, unspecified: Secondary | ICD-10-CM | POA: Insufficient documentation

## 2012-01-02 DIAGNOSIS — R0989 Other specified symptoms and signs involving the circulatory and respiratory systems: Secondary | ICD-10-CM | POA: Insufficient documentation

## 2012-01-02 DIAGNOSIS — J988 Other specified respiratory disorders: Secondary | ICD-10-CM | POA: Insufficient documentation

## 2012-01-02 DIAGNOSIS — R0602 Shortness of breath: Secondary | ICD-10-CM | POA: Insufficient documentation

## 2012-01-02 DIAGNOSIS — R05 Cough: Secondary | ICD-10-CM | POA: Insufficient documentation

## 2012-01-02 DIAGNOSIS — R0600 Dyspnea, unspecified: Secondary | ICD-10-CM

## 2012-01-02 LAB — PULMONARY FUNCTION TEST

## 2012-01-06 ENCOUNTER — Telehealth: Payer: Self-pay | Admitting: Pulmonary Disease

## 2012-01-06 NOTE — Telephone Encounter (Signed)
KC, pt had PFT done at [facility] on 01/02/12- Please advise results thanks!

## 2012-01-06 NOTE — Telephone Encounter (Signed)
Will forward to [place] to track down results, thanks

## 2012-01-06 NOTE — Telephone Encounter (Signed)
To my knowledge, I don't have them.

## 2012-01-07 NOTE — Telephone Encounter (Signed)
  i spoke with Marcelino Duster at [facility] cardiopulmonary and she says the PFT has not been read yet but she will fax over the preliminary for [facility] to review.  KC, the report is in your look at folder.

## 2012-01-07 NOTE — Telephone Encounter (Signed)
I have reviewed pfts with pt.  No obstruction, was not able to do lung volumes due to weakness (but FVC poor), DLCO severely reduced (prob due to CM), and muscle pressures were terrible.    I have recommended a second neurological opinion at one of the tertiary care centers.  She will check to see what insurance will cover.

## 2012-01-08 ENCOUNTER — Other Ambulatory Visit: Payer: Self-pay | Admitting: Pulmonary Disease

## 2012-01-08 DIAGNOSIS — R531 Weakness: Secondary | ICD-10-CM

## 2012-01-08 DIAGNOSIS — R0600 Dyspnea, unspecified: Secondary | ICD-10-CM

## 2012-01-08 NOTE — Telephone Encounter (Signed)
Spoke with pt. She states that she has decided that she does wants neurology appt. She has reviewed options on where to go with ins and has decided to go with [facility]. Will forward back to [facility]. Please advise, thanks!

## 2012-01-08 NOTE — Telephone Encounter (Signed)
Patient calling to speak with Lawson Fiscal concerning neurological appt.

## 2012-01-20 NOTE — Telephone Encounter (Signed)
Referral to [facility] placed by [facility] on 9.12.13.  Phone note signed off.

## 2012-02-04 ENCOUNTER — Telehealth: Payer: Self-pay | Admitting: Pulmonary Disease

## 2012-02-04 NOTE — Telephone Encounter (Signed)
The pt called with concerns that she cannot talk with anyone at [facility] regarding her packet that she is supposed to get in the mail. I have instructed the pt to keep calling the numbers that she has and have also explained that they may not mail this pack let until closer to h er appt date and time. Pt verbalized understanding.

## 2012-02-05 ENCOUNTER — Ambulatory Visit (INDEPENDENT_AMBULATORY_CARE_PROVIDER_SITE_OTHER): Payer: Medicare Other | Admitting: Cardiology

## 2012-02-05 ENCOUNTER — Encounter: Payer: Self-pay | Admitting: Cardiology

## 2012-02-05 VITALS — BP 161/87 | HR 61 | Ht 62.0 in | Wt 169.4 lb

## 2012-02-05 DIAGNOSIS — R0609 Other forms of dyspnea: Secondary | ICD-10-CM

## 2012-02-05 DIAGNOSIS — Z95810 Presence of automatic (implantable) cardiac defibrillator: Secondary | ICD-10-CM

## 2012-02-05 DIAGNOSIS — R0600 Dyspnea, unspecified: Secondary | ICD-10-CM

## 2012-02-05 DIAGNOSIS — I5022 Chronic systolic (congestive) heart failure: Secondary | ICD-10-CM

## 2012-02-05 DIAGNOSIS — I959 Hypotension, unspecified: Secondary | ICD-10-CM

## 2012-02-05 LAB — TSH: TSH: 1.32 u[IU]/mL (ref 0.35–5.50)

## 2012-02-05 MED ORDER — LOSARTAN POTASSIUM 25 MG PO TABS: ORAL_TABLET | ORAL | Status: DC

## 2012-02-05 MED ORDER — CARVEDILOL 3.125 MG PO TABS: 3.1250 mg | ORAL_TABLET | Freq: Two times a day (BID) | ORAL | Status: AC

## 2012-02-05 NOTE — Progress Notes (Signed)
HPI The patient presents for followup of dyspnea. This has been her biggest complaint.   She has been evaluated by Dr. Shelle Iron and does not appear to have a an obvious pulmonary etiology. He is sending her to a neurologist to see if there is a neuromuscular complaint. He wanted to do pulmonary function testing but she couldn't complete these. A BNP level was very normal recently. Today I walked her around the office and her oxygen saturation stayed at 93%. She's not having any new chest pressure, neck or arm discomfort other than a somewhat tight band feeling that she gets at the end of the day she thinks from heavy breathing. It goes away slowly over time.   Allergies  Allergen Reactions  . Bacitracin   . Imuran (Azathioprine)   . Metoclopramide Hcl     Reglan  . Nabumetone     Relafen  . Neomycin-Bacitracin Zn-Polymyx   . Nitroglycerin   . Nsaids   . Nutritional Supplements   . Plaquenil (Hydroxychloroquine Sulfate)     Current Outpatient Prescriptions  Medication Sig Dispense Refill  . Acetaminophen (TYLENOL EXTRA STRENGTH PO) Take 650 mg by mouth as needed.      . Ascorbic Acid (VITAMIN C) 500 MG tablet Take 1,000 mg by mouth daily.       Marland Kitchen aspirin 81 MG tablet Take 81 mg by mouth daily.        . Calcium Citrate-Vitamin D (CALCIUM CITRATE + D PO) Take by mouth daily.       . carvedilol (COREG) 3.125 MG tablet Take 1 tablet (3.125 mg total) by mouth 2 (two) times daily with a meal.  60 tablet  6  . diazepam (VALIUM) 5 MG tablet Take 5 mg by mouth daily.       . Diclofenac Epolamine (FLECTOR) 1.3 % PTCH as needed.        . docusate sodium (COLACE) 100 MG capsule daily.       . furosemide (LASIX) 40 MG tablet Take 1 tablet (40 mg total) by mouth 2 (two) times daily.  180 tablet  2  . gabapentin (NEURONTIN) 300 MG capsule Take 300 mg by mouth 2 (two) times daily.       . Glucosamine-Chondroitin (OSTEO BI-FLEX REGULAR STRENGTH PO) Take 2 tablets by mouth daily.        Marland Kitchen  HYDROcodone-acetaminophen (LORTAB) 10-500 MG per tablet as needed.        . lansoprazole (PREVACID) 30 MG capsule Take 30 mg by mouth daily.        . Multiple Vitamin (MULTIVITAMIN) capsule Take 1 capsule by mouth daily.        . NON FORMULARY Mega Red daily       . oxybutynin (DITROPAN) 5 MG tablet Take 5 mg by mouth daily.        . pravastatin (PRAVACHOL) 40 MG tablet Take 40 mg by mouth daily.        . Psyllium (VEGETABLE LAXATIVE PO) as needed.        . triamcinolone cream (KENALOG) 0.1 % As needed      . Vitamin D, Ergocalciferol, (DRISDOL) 50000 UNITS CAPS Take 50,000 Units by mouth every 7 (seven) days.          Past Medical History  Diagnosis Date  . LBBB (left bundle branch block)   . Nonischemic cardiomyopathy     EF 25-30% echo 2010, 45% echo 2012  . Hypertension   . Diverticulitis   .  ICD (implantable cardiac defibrillator) in place     CRT-D [employer] Jude  . Chronic fatigue   . Fibromyalgia   . Shortness of breath     followed by Dr. Shelle Iron  . Elevated uric acid   . CRI (chronic renal insufficiency)     Past Surgical History  Procedure Date  . Crt-d-st. jude's   . Replacement total knee   . Abdominal hysterectomy   . Appendectomy   . Neck surgery     ROS: As stated in the HPI and negative for all other systems.  PHYSICAL EXAM BP 161/87  Pulse 61  Ht 5\' 2"  (1.575 m)  Wt 76.839 kg (169 lb 6.4 oz)  BMI 30.98 kg/m2  SpO2 96% GENERAL:  Well appearing NECK:  No jugular venous distention, waveform within normal limits, carotid upstroke brisk and symmetric, no bruits, no thyromegaly LUNGS:  Clear to auscultation bilaterally BACK:  No CVA tenderness CHEST:  ICD intact HEART:  PMI not displaced or sustained,S1 and S2 within normal limits, no S3, no S4, no clicks, no rubs, no murmurs ABD:  Flat, positive bowel sounds normal in frequency in pitch, no bruits, no rebound, no guarding, no midline pulsatile mass, no hepatomegaly, no splenomegaly EXT:  2 plus pulses  throughout, no edema, no cyanosis no clubbing NEURO:  Nonfocal, weak  EKG: Atrioventriular pacing rate 60.  02/05/2012  ASSESSMENT AN  PLAN  HYPOTENSION -  Her blood pressure is now elevated as she has been off of Cozaar and I'm going to restart this at 12 mg daily. She will keep her blood pressure diary.  SYSTOLIC HEART FAILURE, CHRONIC -  She seems to be euvolemic. At this point, no change in therapy is indicated. We have reviewed salt and fluid restrictions. No further cardiovascular testing is indicated.  DYSPNEA - The etiology of this has been unclear. At this point Dr.  Shelle Iron thinks that it might be neuromuscular and she's going to have a neurology appointment. I agree with this. No further cardiac workup would be suggested. Of note her BNP this month was well within normal limits arguing against heart failure etiology.   WEIGHT LOSS - The patient reports 6 pounds of weight loss without changing her diet. She will need a TSH.

## 2012-02-05 NOTE — Patient Instructions (Addendum)
Restart Losartan 25 mg 1/2 tablet daily  Will obtain labs today and call you with the results (TSH)  Your physician wants you to follow-up in: 6 months You will receive a reminder letter in the mail two months in advance. If you don't receive a letter, please call our office to schedule the follow-up appointment.

## 2012-02-10 ENCOUNTER — Telehealth: Payer: Self-pay | Admitting: Cardiology

## 2012-02-10 NOTE — Telephone Encounter (Signed)
Reviewed results with pt who states understanding.  She remains SOB  Has an appointment with a specialist soon per pt to be evaluated.

## 2012-02-10 NOTE — Telephone Encounter (Signed)
Pt would like the results of her blood work done last Thursday

## 2012-03-10 DIAGNOSIS — I10 Essential (primary) hypertension: Secondary | ICD-10-CM | POA: Insufficient documentation

## 2012-03-10 DIAGNOSIS — K5792 Diverticulitis of intestine, part unspecified, without perforation or abscess without bleeding: Secondary | ICD-10-CM | POA: Insufficient documentation

## 2012-03-10 DIAGNOSIS — N189 Chronic kidney disease, unspecified: Secondary | ICD-10-CM | POA: Insufficient documentation

## 2012-03-10 DIAGNOSIS — M8580 Other specified disorders of bone density and structure, unspecified site: Secondary | ICD-10-CM | POA: Insufficient documentation

## 2012-03-10 DIAGNOSIS — I447 Left bundle-branch block, unspecified: Secondary | ICD-10-CM | POA: Insufficient documentation

## 2012-03-10 DIAGNOSIS — R0602 Shortness of breath: Secondary | ICD-10-CM | POA: Insufficient documentation

## 2012-03-10 DIAGNOSIS — I428 Other cardiomyopathies: Secondary | ICD-10-CM | POA: Insufficient documentation

## 2012-03-10 DIAGNOSIS — N289 Disorder of kidney and ureter, unspecified: Secondary | ICD-10-CM | POA: Insufficient documentation

## 2012-03-10 DIAGNOSIS — R5382 Chronic fatigue, unspecified: Secondary | ICD-10-CM | POA: Insufficient documentation

## 2012-03-10 DIAGNOSIS — R140 Abdominal distension (gaseous): Secondary | ICD-10-CM | POA: Insufficient documentation

## 2012-03-10 DIAGNOSIS — M797 Fibromyalgia: Secondary | ICD-10-CM | POA: Insufficient documentation

## 2012-03-15 ENCOUNTER — Ambulatory Visit (INDEPENDENT_AMBULATORY_CARE_PROVIDER_SITE_OTHER): Payer: Medicare Other | Admitting: Obstetrics and Gynecology

## 2012-03-15 ENCOUNTER — Encounter: Payer: Self-pay | Admitting: Obstetrics and Gynecology

## 2012-03-15 VITALS — BP 110/60 | HR 72 | Resp 16 | Ht 62.0 in | Wt 168.0 lb

## 2012-03-15 DIAGNOSIS — Z803 Family history of malignant neoplasm of breast: Secondary | ICD-10-CM

## 2012-03-15 DIAGNOSIS — R922 Inconclusive mammogram: Secondary | ICD-10-CM

## 2012-03-15 DIAGNOSIS — K5900 Constipation, unspecified: Secondary | ICD-10-CM

## 2012-03-15 DIAGNOSIS — K5909 Other constipation: Secondary | ICD-10-CM

## 2012-03-15 DIAGNOSIS — R140 Abdominal distension (gaseous): Secondary | ICD-10-CM

## 2012-03-15 DIAGNOSIS — R141 Gas pain: Secondary | ICD-10-CM

## 2012-03-15 NOTE — Progress Notes (Signed)
Last pap: 10 years ago?/Hysterectomy WNL: Yes Regular Periods:Hysterectomy Contraception: Hlysterectomy  Monthly Breast exam:yes Tetanus<66yrs:yes Nl.Bladder Function:yes Daily BMs:yes Healthy Diet:yes Calcium:yes Mammogram:yes Date of Mammogram: 6/26/2013Exercise:no Have often Exercise: None Seatbelt: yes Abuse at home: no Stressful work:no Sigmoid-colonoscopy: 4-5 yrs ago/polyps--benign Bone Density: Yes 2011 PCP: Blair Heys Change in PMH: Laser surgery-- left eye/oral surgery Change in ZOX:WRUE  Subjective:    Heather Bullock is a 78 y.o. female G3P3 who presents for annual exam.  She continues to have epigastric pain, somewhat improved by change in H2 blockers.  She has an appt with her GI doctor for more evaluation.  She has bloating and constipation which will be addressed then as well.  She had a pelvic US which showed no pathology last year. She had a nl mammogram and has heterogeneously dense breasts.  Her sister was just diagnosed with a breast cancer recurrence.  The following portions of the patient's history were reviewed and updated as appropriate: allergies, current medications, past family history, past medical history, past social history, past surgical history and problem list.  Review of Systems Pertinent items are noted in HPI. Gastrointestinal:No change in bowel habits, no abdominal pain, no rectal bleeding Genitourinary:negative for dysuria, frequency, hematuria, nocturia and urinary incontinence    Objective:     There were no vitals taken for this visit.  Weight:  Wt Readings from Last 1 Encounters:  02/05/12 169 lb 6.4 oz (76.839 kg)     BMI: There is no height or weight on file to calculate BMI. General Appearance: Alert, appropriate appearance for age. No acute distress HEENT: Grossly normal Neck / Thyroid: Supple, no masses, nodes or enlargement  Lungs: clear to auscultation bilaterally Back: No CVA tenderness Breast Exam: bilateral  fibrocystic changes and No masses or nodes.No dimpling, nipple retraction or discharge. Left breast with pacemaker subcutaneousl. Cardiovascular: Regular rate and rhythm. S1, S2, no murmur Gastrointestinal: Soft, non-tender, no masses or organomegaly Pelvic Exam: External genitalia: normal general appearance Vaginal: atrophic mucosa and vaginal vault, well suspended Cervix: removed surgically Adnexa: non palpable Uterus: removed surgically Rectovaginal: normal rectal, no masses and large amount stool in colon Lymphatic Exam: Non-palpable nodes in neck, clavicular, axillary, or inguinal regions Skin: no rash or abnormalities Neurologic: Normal gait and speech, no tremor  Psychiatric: Alert and oriented, appropriate affect.    Urinalysis:Not done    Assessment:    Menopause Constipation with bloating  Dense breasts on mammogram Sister with recurrent breast cancer   Plan:   Mammogram.  Pt will decide re 3D mammogram for next year return annually or prn Follow up with GI MD    Dierdre Forth MD

## 2012-03-23 ENCOUNTER — Telehealth: Payer: Self-pay | Admitting: Pulmonary Disease

## 2012-03-23 NOTE — Telephone Encounter (Signed)
Pt is aware of KC recs to keep the appt at [facility] with neuromuscular specialist. Pt states she will not cancel the appt for now and will call if she decides to cancel the appt so [facility] is aware.

## 2012-03-23 NOTE — Telephone Encounter (Signed)
I spoke with pt and she stated she is scheduled to see a neurologists in Feb at [facility]. She stated she does not have the enegery to go all the way there to see a doctor. She is wanting to know if she can be referred to someone local. She was thinking maybe Dr. Rubye Beach. Please advise Dr. Shelle Iron thanks

## 2012-03-23 NOTE — Telephone Encounter (Signed)
I think she has a very special problem that needs specialty evaluation.  I think she would benefit from at least one visit with a neuromuscular specialist.  I will leave it up to her, but I think she needs to get this looked at and go from there.

## 2012-03-31 ENCOUNTER — Ambulatory Visit (INDEPENDENT_AMBULATORY_CARE_PROVIDER_SITE_OTHER): Payer: Medicare Other | Admitting: *Deleted

## 2012-03-31 ENCOUNTER — Encounter: Payer: Self-pay | Admitting: Internal Medicine

## 2012-03-31 DIAGNOSIS — I5022 Chronic systolic (congestive) heart failure: Secondary | ICD-10-CM

## 2012-03-31 DIAGNOSIS — I428 Other cardiomyopathies: Secondary | ICD-10-CM

## 2012-03-31 LAB — ICD DEVICE OBSERVATION
AL AMPLITUDE: 1.9 mv
AL IMPEDENCE ICD: 512.5 Ohm
HV IMPEDENCE: 46 Ohm
RV LEAD IMPEDENCE ICD: 525 Ohm
TOT-0007: 0
TOT-0008: 0
TOT-0010: 10
TZAT-0004FASTVT: 8
TZAT-0013FASTVT: 1
TZAT-0018FASTVT: NEGATIVE
TZAT-0019FASTVT: 7.5 V
TZAT-0019SLOWVT: 7.5 V
TZAT-0020SLOWVT: 1 ms
TZON-0003FASTVT: 280 ms
TZON-0003SLOWVT: 350 ms
TZON-0004FASTVT: 12
TZON-0010FASTVT: 80 ms
TZST-0001FASTVT: 2
TZST-0001FASTVT: 3
TZST-0001FASTVT: 4
TZST-0001SLOWVT: 4
TZST-0003FASTVT: 40 J
TZST-0003FASTVT: 40 J
TZST-0003SLOWVT: 36 J
TZST-0003SLOWVT: 40 J
VF: 0

## 2012-03-31 NOTE — Patient Instructions (Addendum)
Return office visit 06/30/12 @ 11:30am with the device clinic.

## 2012-03-31 NOTE — Progress Notes (Signed)
ICD check with [employer] 

## 2012-04-01 ENCOUNTER — Other Ambulatory Visit: Payer: Self-pay | Admitting: Gastroenterology

## 2012-04-19 ENCOUNTER — Telehealth: Payer: Self-pay | Admitting: Cardiology

## 2012-04-19 MED ORDER — FUROSEMIDE 40 MG PO TABS: 40.0000 mg | ORAL_TABLET | Freq: Two times a day (BID) | ORAL | Status: DC

## 2012-04-19 NOTE — Telephone Encounter (Signed)
plz return call to pt [phone]  Regarding Furosemide refill .

## 2012-06-01 ENCOUNTER — Telehealth: Payer: Self-pay | Admitting: Pulmonary Disease

## 2012-06-01 NOTE — Telephone Encounter (Signed)
I spoke with Heather Bullock and she stated Dr. Alphonzo Dublin did not receive PFT and CT results. She asked this be faxed to [phone].I have done so. Nothing further was needed

## 2012-06-12 ENCOUNTER — Other Ambulatory Visit: Payer: Self-pay

## 2012-06-18 ENCOUNTER — Telehealth: Payer: Self-pay | Admitting: Pulmonary Disease

## 2012-06-18 DIAGNOSIS — R0600 Dyspnea, unspecified: Secondary | ICD-10-CM

## 2012-06-21 ENCOUNTER — Encounter: Payer: Self-pay | Admitting: Pulmonary Disease

## 2012-06-21 ENCOUNTER — Other Ambulatory Visit (HOSPITAL_COMMUNITY): Payer: Self-pay | Admitting: Neurosurgery

## 2012-06-22 ENCOUNTER — Telehealth: Payer: Self-pay | Admitting: Pulmonary Disease

## 2012-06-22 DIAGNOSIS — R0600 Dyspnea, unspecified: Secondary | ICD-10-CM

## 2012-06-22 NOTE — Telephone Encounter (Signed)
Done. He is going to refer her to rheum at [facility], and I will order a followup HRCT and PFT's.

## 2012-06-22 NOTE — Telephone Encounter (Signed)
Talked with neuro at [facility], and they are concerned about her +scleroderma ab.  Typically this does not cause neuromuscular weakness, but if associated with MCTD, can have a component of "shrinking lung syndrome" associated with lupus.   He has asked that we recheck pfts and HRCT.  The ct was neg in 2011.  Dr. Alphonzo Dublin is setting up rheum referral at [facility].   Discussed with pt.  She is unable to do pfts due to severe weakness.  I suspect this is the case.  She is willing to have repeat hrct and will set up.

## 2012-06-23 NOTE — Telephone Encounter (Signed)
Called, spoke with pt.  CT has already been scheduled for March 3 -- pt is aware of this.  First available PFT in our office is approx 3 wks away.  Pt would like to have this done sooner and ok with having it done at [facility].  Order has been placed.  I called Resp, spoke with Marcelino Duster.  Pt scheduled for PFT on Friday, Feb 28 at [facility] at 2:30.  Pt will need to arrive at 2:15 pm.  I have spoken with pt and informed her of PFT date, time, and location.  She verbalized understanding of this and is aware to go to admitting to register.  She voiced no further questions or concerns at this time.

## 2012-06-24 ENCOUNTER — Telehealth: Payer: Self-pay | Admitting: Pulmonary Disease

## 2012-06-24 NOTE — Telephone Encounter (Signed)
Per last phone note: Talked with neuro at [facility], and they are concerned about her +scleroderma ab. Typically this does not cause neuromuscular weakness, but if associated with MCTD, can have a component of "shrinking lung syndrome" associated with lupus. He has asked that we recheck pfts and HRCT. The ct was neg in 2011. Dr. Alphonzo Dublin is setting up rheum referral at [facility].  Discussed with pt. She is unable to do pfts due to severe weakness. I suspect this is the case. She is willing to have repeat hrct and will set up.   Pt advised and she states will call and send an email to Dr. Alphonzo Dublin to see where they are in the process. Carron Curie, CMA

## 2012-06-25 ENCOUNTER — Ambulatory Visit (HOSPITAL_COMMUNITY)
Admission: RE | Admit: 2012-06-25 | Discharge: 2012-06-25 | Disposition: A | Discharge status: Home / Self Care | Payer: Medicare Other | Source: Ambulatory Visit | Attending: Pulmonary Disease | Admitting: Pulmonary Disease

## 2012-06-25 DIAGNOSIS — R0989 Other specified symptoms and signs involving the circulatory and respiratory systems: Secondary | ICD-10-CM | POA: Insufficient documentation

## 2012-06-25 DIAGNOSIS — R0609 Other forms of dyspnea: Secondary | ICD-10-CM | POA: Insufficient documentation

## 2012-06-25 DIAGNOSIS — R0600 Dyspnea, unspecified: Secondary | ICD-10-CM

## 2012-06-25 LAB — PULMONARY FUNCTION TEST

## 2012-06-25 MED ORDER — ALBUTEROL SULFATE (5 MG/ML) 0.5% IN NEBU
2.5000 mg | INHALATION_SOLUTION | Freq: Once | RESPIRATORY_TRACT | Status: AC
  Administered 2012-06-25: 2.5 mg via RESPIRATORY_TRACT

## 2012-06-28 ENCOUNTER — Ambulatory Visit (INDEPENDENT_AMBULATORY_CARE_PROVIDER_SITE_OTHER)
Admission: RE | Admit: 2012-06-28 | Discharge: 2012-06-28 | Disposition: A | Discharge status: Home / Self Care | Payer: Medicare Other | Source: Ambulatory Visit | Attending: Pulmonary Disease | Admitting: Pulmonary Disease

## 2012-06-28 ENCOUNTER — Encounter: Payer: Self-pay | Admitting: Pulmonary Disease

## 2012-06-28 DIAGNOSIS — R0989 Other specified symptoms and signs involving the circulatory and respiratory systems: Secondary | ICD-10-CM

## 2012-06-28 DIAGNOSIS — R0600 Dyspnea, unspecified: Secondary | ICD-10-CM

## 2012-06-30 ENCOUNTER — Encounter: Payer: Self-pay | Admitting: Internal Medicine

## 2012-06-30 ENCOUNTER — Ambulatory Visit (INDEPENDENT_AMBULATORY_CARE_PROVIDER_SITE_OTHER): Payer: Medicare Other | Admitting: *Deleted

## 2012-06-30 ENCOUNTER — Telehealth: Payer: Self-pay | Admitting: *Deleted

## 2012-06-30 ENCOUNTER — Other Ambulatory Visit: Payer: Self-pay

## 2012-06-30 DIAGNOSIS — I428 Other cardiomyopathies: Secondary | ICD-10-CM

## 2012-06-30 DIAGNOSIS — I5022 Chronic systolic (congestive) heart failure: Secondary | ICD-10-CM

## 2012-06-30 LAB — ICD DEVICE OBSERVATION
AL AMPLITUDE: 2.4 mv
AL IMPEDENCE ICD: 512.5 Ohm
ATRIAL PACING ICD: 58 pct
BAMS-0001: 150 {beats}/min
BAMS-0003: 70 {beats}/min
MODE SWITCH EPISODES: 3
RV LEAD AMPLITUDE: 12 mv
RV LEAD THRESHOLD: 0.5 V
TOT-0007: 0
TOT-0008: 0
TZAT-0001FASTVT: 1
TZAT-0001SLOWVT: 1
TZAT-0004FASTVT: 8
TZAT-0012FASTVT: 200 ms
TZAT-0018SLOWVT: NEGATIVE
TZON-0003SLOWVT: 350 ms
TZON-0004FASTVT: 12
TZON-0010FASTVT: 80 ms
TZON-0010SLOWVT: 80 ms
TZST-0001FASTVT: 3
TZST-0001FASTVT: 5
TZST-0001SLOWVT: 2
TZST-0001SLOWVT: 5
TZST-0003FASTVT: 36 J
TZST-0003FASTVT: 40 J
TZST-0003SLOWVT: 36 J
TZST-0003SLOWVT: 40 J
VENTRICULAR PACING ICD: 0.2 pct
VF: 0

## 2012-06-30 NOTE — Telephone Encounter (Signed)
PFT results have been received that were ordered on 06/22/12. This is in your green folder for review.

## 2012-06-30 NOTE — Progress Notes (Signed)
ICD check with [employer] 

## 2012-07-01 ENCOUNTER — Telehealth: Payer: Self-pay | Admitting: Pulmonary Disease

## 2012-07-01 NOTE — Telephone Encounter (Signed)
Pt is aware that her results are back but we are waiting for Dr. Shelle Iron to take a look at them. She verbalized understanding and will await our call for her results.  KC - please look at her CT scan and PFT. Thanks!

## 2012-07-05 NOTE — Telephone Encounter (Addendum)
Pt called back again. Still waiting to get results of tests. Prefers to speak to Dr. Shelle Iron today if possible.  Heather Bullock

## 2012-07-06 NOTE — Telephone Encounter (Signed)
Result Notes    Notes Recorded by Barbaraann Share, MD on 07/05/2012 at 6:11 PM The ct chest did not show scarring, but did show a few other things, as did the breathing studies. I would really like to see her in the office this week if she is willing to come to discuss these things face to face to make sure we both understand. Please get her in somewhere this week.   Called spoke with patient [facility] with opening tomorrow morning 3.12.14 @ 0915 Pt unable to come d/t transportation Per [facility], ok for tomorrow 3.12.14 @ 4:30pm Appt scheduled, pt to call back if unable to make this time Will sign and forward to [facility] as FYI

## 2012-07-07 ENCOUNTER — Encounter: Payer: Self-pay | Admitting: Pulmonary Disease

## 2012-07-07 ENCOUNTER — Ambulatory Visit (INDEPENDENT_AMBULATORY_CARE_PROVIDER_SITE_OTHER): Payer: Medicare Other | Admitting: Pulmonary Disease

## 2012-07-07 VITALS — BP 152/70 | HR 66 | Temp 98.4°F | Ht 63.0 in | Wt 170.8 lb

## 2012-07-07 DIAGNOSIS — J438 Other emphysema: Secondary | ICD-10-CM

## 2012-07-07 DIAGNOSIS — J449 Chronic obstructive pulmonary disease, unspecified: Secondary | ICD-10-CM | POA: Insufficient documentation

## 2012-07-07 DIAGNOSIS — R0989 Other specified symptoms and signs involving the circulatory and respiratory systems: Secondary | ICD-10-CM

## 2012-07-07 DIAGNOSIS — R0600 Dyspnea, unspecified: Secondary | ICD-10-CM

## 2012-07-07 DIAGNOSIS — J439 Emphysema, unspecified: Secondary | ICD-10-CM

## 2012-07-07 MED ORDER — INDACATEROL MALEATE 75 MCG IN CAPS: 1.0000 | ORAL_CAPSULE | Freq: Every day | RESPIRATORY_TRACT | Status: DC

## 2012-07-07 NOTE — Assessment & Plan Note (Signed)
The patient has mild airflow obstruction by spirometry, and I suspect this is related to senile emphysema.  She does have a history of asthma during childhood, but no history to suggest persistent disease through adulthood.  I would like to try her on arcapta to see if this helps.

## 2012-07-07 NOTE — Patient Instructions (Addendum)
Your ct chest showed mild fluid related to your known chronic congestive heart failure, but also areas of lung that is trapping air and related to copd.  There is no scarring in the lung to suggest fibrosis.  There is also a "ball of airless lung" on the left side that has been there for some time and has changed only a little.   Your breathing tests showed mild COPD, and will start you on arcapta one inhalation each am to see if we can improve your breathing.  Let us know if helps, and we can call in a prescription. Stay on your bipap machine. Will call your neurologist at [facility] to discuss results.

## 2012-07-07 NOTE — Assessment & Plan Note (Signed)
The patient has significant dyspnea on exertion and also at rest that I suspect is primarily related to muscle weakness and restrictive lung disease.  She also has a cardiomyopathy with chronic heart failure, and now a new finding of mild airflow obstruction.  She does not have interstitial lung disease on her HRCT.  She has an upcoming appointment with rheumatology, and it will be interesting to see if she may have the "shrinking lung syndrome" associated with systemic lupus.  I would like to try her on a long acting beta agonist to see if we can improve her breathing even a little bit.  She is to continue on her bilevel with sleep.

## 2012-07-07 NOTE — Progress Notes (Signed)
  Subjective:    Patient ID: Heather Bullock, female    DOB: 1928/05/23, 79 y.o.   MRN: [id number]  HPI The patient comes in today for followup of her recent CT chest and PFTs.  She has significant dyspnea on exertion that is related to significant muscle weakness and also chronic congestive heart failure from her cardiomyopathy.  She is using bilevel at night as a respiratory assist device for chronic hypercarbia.  She was recently seen by neurology at [facility], and the question was raised whether she may have an autoimmune disease.  She has been referred to rheumatology with an appointment upcoming.  The question was raised whether she may have underlying interstitial lung disease, and therefore a high resolution CT chest was done.  This showed no evidence of interstitial lung disease, but did show mild chronic edema.  There were also areas of mosaic attenuation consistent with small airways disease/air trapping.  Finally, the patient is known to have a chronic density in the left base that is felt to be rounded atelectasis with calcification.  This appears to have increased somewhat in size.  The patient has had pulmonary function studies which were very difficult for her to participate.  They showed the new finding of air flow obstruction that was mild on spirometry, but she was unable to do lung volumes because of weakness.  Her diffusion capacity is also an accurate because of difficulties with the maneuver.  She obviously has severe restriction based on her forced vital capacity.  The patient has a history of asthma as a child, but has never smoked.   Review of Systems  Constitutional: Positive for fatigue. Negative for fever and unexpected weight change.  HENT: Negative for ear pain, nosebleeds, congestion, sore throat, rhinorrhea, sneezing, trouble swallowing, dental problem, postnasal drip and sinus pressure.   Eyes: Negative for redness and itching.  Respiratory: Positive for cough, chest  tightness, shortness of breath and wheezing.   Cardiovascular: Negative for palpitations and leg swelling.  Gastrointestinal: Negative for nausea and vomiting.  Genitourinary: Negative for dysuria.  Musculoskeletal: Negative for joint swelling.  Skin: Negative for rash.  Neurological: Negative for headaches.  Hematological: Does not bruise/bleed easily.  Psychiatric/Behavioral: Negative for dysphoric mood. The patient is not nervous/anxious.        Objective:   Physical Exam Well-developed female in no acute distress Nose without purulence or discharge noted Neck without thyromegaly or lymphadenopathy Chest with decreased depth of inspiration and basilar crackles, no wheezing Cardiac exam with regular rate and rhythm Lower extremities with 1+ edema, no cyanosis Alert and oriented, moves all 4 extremities.       Assessment & Plan:

## 2012-07-08 ENCOUNTER — Other Ambulatory Visit: Payer: Self-pay | Admitting: Pulmonary Disease

## 2012-07-08 ENCOUNTER — Telehealth: Payer: Self-pay | Admitting: *Deleted

## 2012-07-08 DIAGNOSIS — R9389 Abnormal findings on diagnostic imaging of other specified body structures: Secondary | ICD-10-CM | POA: Insufficient documentation

## 2012-07-08 NOTE — Telephone Encounter (Signed)
done

## 2012-07-08 NOTE — Telephone Encounter (Signed)
Spoke with patient and she is wanting to move forward with the PET scan and get this done. Patient just wants to be 100% sure that there is nothing to worry about.  Will send to Dr Shelle Iron for okay to order PET.

## 2012-07-13 ENCOUNTER — Ambulatory Visit (HOSPITAL_COMMUNITY)
Admission: RE | Admit: 2012-07-13 | Discharge: 2012-07-13 | Disposition: A | Discharge status: Home / Self Care | Payer: Medicare Other | Source: Ambulatory Visit | Attending: Pulmonary Disease | Admitting: Pulmonary Disease

## 2012-07-13 DIAGNOSIS — R222 Localized swelling, mass and lump, trunk: Secondary | ICD-10-CM | POA: Insufficient documentation

## 2012-07-13 DIAGNOSIS — R911 Solitary pulmonary nodule: Secondary | ICD-10-CM | POA: Insufficient documentation

## 2012-07-13 DIAGNOSIS — D491 Neoplasm of unspecified behavior of respiratory system: Secondary | ICD-10-CM | POA: Insufficient documentation

## 2012-07-13 DIAGNOSIS — R9389 Abnormal findings on diagnostic imaging of other specified body structures: Secondary | ICD-10-CM

## 2012-07-13 LAB — GLUCOSE, CAPILLARY: Glucose-Capillary: 104 mg/dL — ABNORMAL HIGH (ref 70–99)

## 2012-07-13 MED ORDER — FLUDEOXYGLUCOSE F - 18 (FDG) INJECTION
18.5000 | Freq: Once | INTRAVENOUS | Status: AC | PRN
  Administered 2012-07-13: 18.5 via INTRAVENOUS

## 2012-07-15 ENCOUNTER — Telehealth: Payer: Self-pay | Admitting: Pulmonary Disease

## 2012-07-15 NOTE — Telephone Encounter (Signed)
Spoke to pt. She states that today she started wheezing. No coughing. Thinks this is coming from Arcapta inhaler. This is only change that has been made recently.  Wants KC recommendations. Please advise.

## 2012-07-15 NOTE — Telephone Encounter (Signed)
Make sure pt is doing this after each use of inhaler: Rinse and spit mouth, gargle and spit, then gargle and swallow. If symptoms continue, let us know.

## 2012-07-16 ENCOUNTER — Encounter: Payer: Self-pay | Admitting: Pulmonary Disease

## 2012-07-16 NOTE — Telephone Encounter (Signed)
I spoke with the pt and she was not rinsing as directed so I advised her of proper technique and advised her to let us know if symptoms continue. Carron Curie, CMA

## 2012-07-19 ENCOUNTER — Encounter: Payer: Self-pay | Admitting: Cardiology

## 2012-07-19 ENCOUNTER — Ambulatory Visit (INDEPENDENT_AMBULATORY_CARE_PROVIDER_SITE_OTHER): Payer: Medicare Other | Admitting: Cardiology

## 2012-07-19 VITALS — BP 120/74 | HR 61 | Ht 62.5 in | Wt 163.0 lb

## 2012-07-19 DIAGNOSIS — I428 Other cardiomyopathies: Secondary | ICD-10-CM

## 2012-07-19 DIAGNOSIS — I5022 Chronic systolic (congestive) heart failure: Secondary | ICD-10-CM

## 2012-07-19 DIAGNOSIS — I959 Hypotension, unspecified: Secondary | ICD-10-CM

## 2012-07-19 DIAGNOSIS — I10 Essential (primary) hypertension: Secondary | ICD-10-CM

## 2012-07-19 MED ORDER — LOSARTAN POTASSIUM 25 MG PO TABS: 125.5000 mg | ORAL_TABLET | Freq: Two times a day (BID) | ORAL | Status: DC

## 2012-07-19 NOTE — Progress Notes (Signed)
HPI The patient presents for followup of dyspnea.  She has been evaluated by Dr. Shelle Iron she was recently sent to [facility] to a neuromuscular specialist. He is considering the possibility of scleroderma and has actually sent her to a rheumatologist. This appointment is early next month. She continues to have dyspnea with normal oxygen saturations.  Not describing new PND or orthopnea. She's not describing new palpitations, presyncope or syncope. She's had no weight gain. Her edema is somewhat stable. However, she is very limited by the inability to take a deep breath.  Allergies  Allergen Reactions  . Bacitracin   . Imuran (Azathioprine)   . Metoclopramide Hcl     Reglan  . Nabumetone     Relafen  . Neomycin-Bacitracin Zn-Polymyx   . Nitroglycerin   . Nsaids   . Nutritional Supplements   . Plaquenil (Hydroxychloroquine Sulfate)     Current Outpatient Prescriptions  Medication Sig Dispense Refill  . Acetaminophen (TYLENOL EXTRA STRENGTH PO) Take 650 mg by mouth as needed.      . Ascorbic Acid (VITAMIN C) 500 MG tablet Take 1,000 mg by mouth daily.       Marland Kitchen aspirin 81 MG tablet Take 81 mg by mouth daily.        . Calcium Citrate-Vitamin D (CALCIUM CITRATE + D PO) Take by mouth daily.       . carvedilol (COREG) 3.125 MG tablet Take 1 tablet (3.125 mg total) by mouth 2 (two) times daily with a meal.  180 tablet  6  . diazepam (VALIUM) 5 MG tablet Take 5 mg by mouth daily.       . diclofenac (FLECTOR) 1.3 % PTCH Place 1 patch onto the skin 2 (two) times daily.      Marland Kitchen docusate sodium (COLACE) 100 MG capsule daily.       . furosemide (LASIX) 40 MG tablet Take 1 tablet (40 mg total) by mouth 2 (two) times daily.  180 tablet  2  . gabapentin (NEURONTIN) 300 MG capsule Take 300 mg by mouth 2 (two) times daily.       . Glucosamine-Chondroitin (OSTEO BI-FLEX REGULAR STRENGTH PO) Take 2 tablets by mouth daily.        Marland Kitchen HYDROcodone-acetaminophen (NORCO) 10-325 MG per tablet Take 1 tablet by  mouth every 6 (six) hours as needed for pain.      . Indacaterol Maleate (ARCAPTA NEOHALER) 75 MCG CAPS Place 1 capsule into inhaler and inhale daily.  6 capsule  0  . losartan (COZAAR) 25 MG tablet 1/2 tablet daily  45 tablet  3  . Multiple Vitamin (MULTIVITAMIN) capsule Take 1 capsule by mouth daily.        . NON FORMULARY Mega Red daily       . omeprazole (PRILOSEC) 20 MG capsule Take 20 mg by mouth 2 (two) times daily.      Marland Kitchen oxybutynin (DITROPAN) 5 MG tablet Take 5 mg by mouth daily.        . pravastatin (PRAVACHOL) 40 MG tablet Take 40 mg by mouth daily.        . Psyllium (VEGETABLE LAXATIVE PO) as needed.        . triamcinolone cream (KENALOG) 0.1 % As needed      . Vitamin D, Ergocalciferol, (DRISDOL) 50000 UNITS CAPS Take 50,000 Units by mouth every 7 (seven) days.         No current facility-administered medications for this visit.    Past Medical History  Diagnosis Date  . LBBB (left bundle branch block)   . Nonischemic cardiomyopathy     EF 25-30% echo 2010, 45% echo 2012  . Hypertension   . Diverticulitis   . ICD (implantable cardiac defibrillator) in place     CRT-D [employer]  . Chronic fatigue   . Fibromyalgia   . Shortness of breath     followed by Dr. Shelle Iron  . Elevated uric acid   . CRI (chronic renal insufficiency)   . Abdominal bloating   . Osteopenia     Past Surgical History  Procedure Laterality Date  . Crt-d-st. jude's    . Replacement total knee    . Abdominal hysterectomy    . Appendectomy    . Neck surgery    . Tonsillectomy    . Neck surgery  02/2007    cervical  . Laser surgery left eye  2013  . Oral surgery/infection in root of teeth  2013    ROS: As stated in the HPI and negative for all other systems.  PHYSICAL EXAM BP 120/74  Pulse 61  Ht 5' 2.5" (1.588 m)  Wt 163 lb (73.936 kg)  BMI 29.32 kg/m2 GENERAL:  Frail appearing NECK:  No jugular venous distention, waveform within normal limits, carotid upstroke brisk and symmetric, no  bruits, no thyromegaly LUNGS:  Clear to auscultation bilaterally BACK:  No CVA tenderness CHEST:  ICD intact HEART:  PMI not displaced or sustained,S1 and S2 within normal limits, no S3, no S4, no clicks, no rubs, no murmurs ABD:  Flat, positive bowel sounds normal in frequency in pitch, no bruits, no rebound, no guarding, no midline pulsatile mass, no hepatomegaly, no splenomegaly EXT:  2 plus pulses throughout, mild/moderate bilateral edema, no cyanosis no clubbing NEURO:  Nonfocal, weak  EKG: Atrioventriular pacing rate 60.  07/19/2012  ASSESSMENT AN  PLAN  HYPOTENSION -  I'm going to try to titrate her Cozaar slightly. She has been tolerating reinitiation of this medication at low dose.  SYSTOLIC HEART FAILURE, CHRONIC -  She seems to be euvolemic. At this point I will change the medication as above.. We have reviewed salt and fluid restrictions. No further cardiovascular testing is indicated.  I will repeat an echocardiogram as it has been slightly over one year.  Of note she is going to see a rheumatologist for consideration of a diagnosis of scleroderma. I will await the results of this.  DYSPNEA - The etiology of this seems to be multifactorial.   At this point Dr. Shelle Iron recently had a PET scan ordered and I have reviewed this result.  I LLL mass is going to be followed and there might be a biopsy later.  I am going to treat her heart failure as above.

## 2012-07-19 NOTE — Patient Instructions (Addendum)
Please increase your Cozaar to 12.5 mg twice a day. Continue all other medications as listed  Your physician has requested that you have an echocardiogram. Echocardiography is a painless test that uses sound waves to create images of your heart. It provides your doctor with information about the size and shape of your heart and how well your heart's chambers and valves are working. This procedure takes approximately one hour. There are no restrictions for this procedure.  Follow up with Dr Antoine Poche in 6 weeks.

## 2012-07-26 ENCOUNTER — Ambulatory Visit (HOSPITAL_COMMUNITY): Payer: Medicare Other | Attending: Cardiology | Admitting: Radiology

## 2012-07-26 DIAGNOSIS — M79609 Pain in unspecified limb: Secondary | ICD-10-CM | POA: Insufficient documentation

## 2012-07-26 DIAGNOSIS — J449 Chronic obstructive pulmonary disease, unspecified: Secondary | ICD-10-CM | POA: Insufficient documentation

## 2012-07-26 DIAGNOSIS — I509 Heart failure, unspecified: Secondary | ICD-10-CM

## 2012-07-26 DIAGNOSIS — E785 Hyperlipidemia, unspecified: Secondary | ICD-10-CM | POA: Insufficient documentation

## 2012-07-26 DIAGNOSIS — J4489 Other specified chronic obstructive pulmonary disease: Secondary | ICD-10-CM | POA: Insufficient documentation

## 2012-07-26 DIAGNOSIS — I10 Essential (primary) hypertension: Secondary | ICD-10-CM | POA: Insufficient documentation

## 2012-07-26 DIAGNOSIS — I5022 Chronic systolic (congestive) heart failure: Secondary | ICD-10-CM | POA: Insufficient documentation

## 2012-07-26 DIAGNOSIS — I428 Other cardiomyopathies: Secondary | ICD-10-CM | POA: Insufficient documentation

## 2012-07-26 NOTE — Progress Notes (Signed)
Echocardiogram performed.  

## 2012-07-27 ENCOUNTER — Telehealth: Payer: Self-pay | Admitting: Pulmonary Disease

## 2012-07-27 ENCOUNTER — Telehealth: Payer: Self-pay

## 2012-07-27 MED ORDER — FORMOTEROL FUMARATE 12 MCG IN CAPS: 12.0000 ug | ORAL_CAPSULE | Freq: Two times a day (BID) | RESPIRATORY_TRACT | Status: DC

## 2012-07-27 NOTE — Telephone Encounter (Signed)
Can try foradil as an alternative.  Would have to take am and pm rather than once a day. Ok to call in 30 day trial if she wishes to try.

## 2012-07-27 NOTE — Telephone Encounter (Signed)
received refill verification from pt pharmacy ([facility]) for losartan. called in correct dosage of 0.5 tablets twice daily.

## 2012-07-27 NOTE — Telephone Encounter (Signed)
Spoke with pt and notified of Dr Shelle Iron recommendations to try Foradil bid in place of Arcapta.  30 day rx sent to [facility] on [address]

## 2012-07-27 NOTE — Telephone Encounter (Signed)
Spoke with patient Patient reports Arcapta helped "a little bit" but she does not think it is worth what it costs.   Patient states she checked on the price of this med in a 90 days supply and it is too Expensive Also spoke with [employer] (insurance co.)--and she was told that if a rx is written for Arcapta, it needs to be filled at a lower tier so it can be affordable. Patient would like to know if Dr.Clance has any suggestions for a cheaper inhaler or if he thinks she should stay with the Arcapta. Dr. Shelle Iron, please advise, thank you!  Last OV: 07/07/12 no f/u

## 2012-07-29 ENCOUNTER — Telehealth: Payer: Self-pay | Admitting: Pulmonary Disease

## 2012-07-29 ENCOUNTER — Ambulatory Visit: Payer: Medicare Other | Admitting: Cardiology

## 2012-07-29 NOTE — Telephone Encounter (Signed)
I spoke with pt and she is wanting to know if [facility] can order someone order her to have home health through [facility] to see if she qualify's. Per pt she lives alone and SOB all the time it is hard for her to do most things. Pt aware KC out of the office today. Please advise KC thanks

## 2012-07-30 NOTE — Telephone Encounter (Signed)
Spoke with pt and notified of recs per KC She verbalized understanding and states nothing further needed 

## 2012-07-30 NOTE — Telephone Encounter (Signed)
Let her know she may qualify, but home health is usually done thru her primary physician.

## 2012-08-04 ENCOUNTER — Telehealth: Payer: Self-pay | Admitting: Pulmonary Disease

## 2012-08-04 NOTE — Telephone Encounter (Signed)
I spoke with pt and she stated she went to see Rheumatology today ([facility]) and was advised she needed to have bx on her "lungs" and it was not her scleroderma. There note is in care everywhere. Please advise KC thanks

## 2012-08-09 ENCOUNTER — Other Ambulatory Visit: Payer: Self-pay | Admitting: *Deleted

## 2012-08-09 NOTE — Telephone Encounter (Signed)
Pt needs bronch spot WED morning at 730am Small scope , no isolation, no fluoro. Make sure pt knows no aspirin until after procedure, and NPO except meds after midnight day of procedure.

## 2012-08-09 NOTE — Telephone Encounter (Signed)
Patient scheduled THURS 08/12/12 @730  Patient aware of instructions prior to procedure.   Patient wanted to make sure Dr Shelle Iron aware that d/t her beliefs/religion, if any complications, NO BLOOD TRANSFUSIONS. This to be documented in patient chart.

## 2012-08-10 ENCOUNTER — Telehealth: Payer: Self-pay | Admitting: Cardiology

## 2012-08-10 ENCOUNTER — Other Ambulatory Visit (INDEPENDENT_AMBULATORY_CARE_PROVIDER_SITE_OTHER): Payer: Medicare Other

## 2012-08-10 DIAGNOSIS — I5021 Acute systolic (congestive) heart failure: Secondary | ICD-10-CM

## 2012-08-10 LAB — BASIC METABOLIC PANEL
BUN: 14 mg/dL (ref 6–23)
Chloride: 99 mEq/L (ref 96–112)
Glucose, Bld: 110 mg/dL — ABNORMAL HIGH (ref 70–99)
Potassium: 3.4 mEq/L — ABNORMAL LOW (ref 3.5–5.1)

## 2012-08-10 MED ORDER — POTASSIUM CHLORIDE CRYS ER 20 MEQ PO TBCR: EXTENDED_RELEASE_TABLET | ORAL | Status: DC

## 2012-08-10 MED ORDER — FUROSEMIDE 40 MG PO TABS: ORAL_TABLET | ORAL | Status: DC

## 2012-08-10 NOTE — Telephone Encounter (Signed)
Patient's labs reviewed and taken to Dr. Elease Hashimoto.  Called patient to inform her that Dr. Elease Hashimoto recommends that she increase her Lasix to 80 mg BID and to start taking KDur 20 mEq BID.  Rx sent to [facility] pharmacy and patient verbalized understanding of instructions.  Dr. Elease Hashimoto recommends f/u in 1 week and repeat BMET.  There are no appointments with Dr. Antoine Poche or any of the NP/PAs so I will route to Dr. Antoine Poche and Elita Quick for advice and/or to [facility] patient onto their schedule for next week.  Instructed patient to advise [place] of further weight gain and/or rapid weight loss.  Patient states she is being monitored by [facility].

## 2012-08-10 NOTE — Telephone Encounter (Signed)
New problem    Pt wanted to inform dr hochrein that per nurse from [employer] she has gained weight in the last few days

## 2012-08-10 NOTE — Telephone Encounter (Signed)
Spoke with patient who states [employer] nurse called to tell her that her weight is steadily increasing over the past week.  Patient states today she weighs 176 lb and that she weighed 172 a few days ago.  Last office visit shows patient weight 163 on 3/24.  Patient states that she is not urinating as frequently.  Patient states she has a newly diagnosed mass on her lung and is having a biopsy 4/17.  Reviewed labs and medications and verified that patient is currently taking Lasix 40 mg BID.  Patient states she has not had lab work done with PCP or any other provider that is not in EPIC recently.  Consulted with Dr. Elease Hashimoto, DOD who states patient needs a BMET today so that we can assess kidney function before making medication changes.  Dr. Elease Hashimoto states patient can take one additional Lasix 20 mg this morning.  Patient verbalized understanding of plan of care and will come here for BMET.

## 2012-08-11 ENCOUNTER — Encounter (HOSPITAL_COMMUNITY): Payer: Self-pay

## 2012-08-11 NOTE — Telephone Encounter (Signed)
Double book on my schedule when I return.

## 2012-08-12 ENCOUNTER — Ambulatory Visit (HOSPITAL_COMMUNITY)
Admission: RE | Admit: 2012-08-12 | Discharge: 2012-08-12 | Disposition: A | Discharge status: Home / Self Care | Payer: Medicare Other | Source: Ambulatory Visit | Attending: Pulmonary Disease | Admitting: Pulmonary Disease

## 2012-08-12 ENCOUNTER — Encounter (HOSPITAL_COMMUNITY)
Admission: RE | Disposition: A | Discharge status: Home / Self Care | Payer: Self-pay | Source: Ambulatory Visit | Attending: Pulmonary Disease

## 2012-08-12 ENCOUNTER — Ambulatory Visit (HOSPITAL_COMMUNITY): Payer: Medicare Other

## 2012-08-12 ENCOUNTER — Encounter (HOSPITAL_COMMUNITY): Payer: Self-pay | Admitting: Respiratory Therapy

## 2012-08-12 DIAGNOSIS — C3430 Malignant neoplasm of lower lobe, unspecified bronchus or lung: Secondary | ICD-10-CM | POA: Insufficient documentation

## 2012-08-12 HISTORY — PX: VIDEO BRONCHOSCOPY: SHX5072

## 2012-08-12 SURGERY — VIDEO BRONCHOSCOPY WITHOUT FLUORO
Anesthesia: Moderate Sedation | Laterality: Bilateral

## 2012-08-12 MED ORDER — MEPERIDINE HCL 100 MG/ML IJ SOLN
INTRAMUSCULAR | Status: AC
  Filled 2012-08-12: qty 2

## 2012-08-12 MED ORDER — LIDOCAINE HCL 2 % EX GEL
CUTANEOUS | Status: DC | PRN
  Administered 2012-08-12: 2

## 2012-08-12 MED ORDER — LIDOCAINE VISCOUS 2 % MT SOLN: OROMUCOSAL | Status: DC | PRN

## 2012-08-12 MED ORDER — MIDAZOLAM HCL 10 MG/2ML IJ SOLN
INTRAMUSCULAR | Status: AC
  Filled 2012-08-12: qty 4

## 2012-08-12 MED ORDER — PHENYLEPHRINE HCL 0.25 % NA SOLN
NASAL | Status: DC | PRN
  Administered 2012-08-12: 2 via NASAL

## 2012-08-12 MED ORDER — MIDAZOLAM HCL 10 MG/2ML IJ SOLN
INTRAMUSCULAR | Status: DC | PRN
  Administered 2012-08-12: 5 mg via INTRAVENOUS
  Administered 2012-08-12: 3 mg via INTRAVENOUS

## 2012-08-12 MED ORDER — LIDOCAINE HCL 1 % IJ SOLN
INTRAMUSCULAR | Status: DC | PRN
  Administered 2012-08-12: 6 mL via RESPIRATORY_TRACT

## 2012-08-12 MED ORDER — LIDOCAINE HCL (PF) 1 % IJ SOLN: INTRAMUSCULAR | Status: DC | PRN

## 2012-08-12 MED ORDER — MEPERIDINE HCL 25 MG/ML IJ SOLN
INTRAMUSCULAR | Status: DC | PRN
  Administered 2012-08-12: 50 mg via INTRAVENOUS

## 2012-08-12 NOTE — Telephone Encounter (Signed)
I spoke with Heather Bullock and have made her an appt with Dr. Antoine Poche for 08/19/12 at 2 pm as per Dr. Antoine Poche okay to double book.  Heather Bullock had lung biopsy today and is feeling very tired & sore. Reassurance given. Mylo Red RN

## 2012-08-12 NOTE — H&P (Signed)
  Please see note in office from 07/07/2012.  Pt is stable for bronchoscopy, and is aware of potential complications of acute respiratory failure, bleeding, ptx.

## 2012-08-12 NOTE — Progress Notes (Signed)
Video bronchoscopy with brushing intervention, washing intervention, and biopsy intervention

## 2012-08-12 NOTE — Brief Op Note (Signed)
Dictation number:  [id number]

## 2012-08-12 NOTE — Op Note (Signed)
NAMECHAPEL, SILVERTHORN NO.:  [id number]  MEDICAL RECORD NO.:  [id number]  LOCATION:  [facility]                         FACILITY:  [facility]  PHYSICIAN:  Barbaraann Share, MD,FCCPDATE OF BIRTH:  Sep 19, 1928  DATE OF PROCEDURE:  08/12/2012 DATE OF DISCHARGE:                              OPERATIVE REPORT   PROCEDURE:  Flexible fiberoptic bronchoscopy with video and biopsy.  INDICATION FOR THE PROCEDURE:  Abnormal density involving the left lower lobe felt to represent rounded atelectasis versus inflammation versus malignancy.  OPERATOR:  Barbaraann Share, MD,FCCP  ANESTHESIA:  Demerol 50 mg IV, Versed 8 mg IV, topical 1% lidocaine in the vocal cords and airways during the procedure.  DESCRIPTION:  After obtaining informed consent and under close cardiopulmonary monitoring, the above preop anesthesia was given.  The fiberoptic scope was passed to the right naris and into posterior pharynx.  There were no lesions or other abnormalities seen it.  The vocal cords appeared to be within normal limits and moved bilaterally on phonation.  Scope was then passed into the trachea where it was examined along its entire length down to the level of the carina, all of which was normal.  The right tracheobronchial tree was examined serially to subsegmental level with no endobronchial abnormality being found. Attention was then paid to the left tracheobronchial tree where the left upper lobe appeared to be patent and within normal limits.  The left lower lobe tracheobronchial tree showed patency and normalcy of her superior segment, however, there was significant concentrate narrowing of her main lower lobe bronchus down into the basilar segments.  All of these appeared mildly edematous, however, again, there was extrinsic compression that was awaiting the opening to each of the basilar segments.  There did not appear to be any mucosal abnormality within the view of the scope.  The airways  were easily collapsible, and saline was used to distant these open for closer visual inspection. Bronchoalveolar lavage was done from the basilar segments of the left lower lobe and sent to the usual cytologic and bacteriologic evaluation. Bronchial brushes were then done from the various segments as well. Finally, transbronchial lung biopsies were done primarily from the posterior basilar and lateral basilar segments of the left lower lobe with good material being obtained.  Overall, the patient tolerated procedure well.  There were no complications.  Chest x-ray is pending at time of dictation to rule out pneumothorax post transbronchial lung biopsy.     Barbaraann Share, MD,FCCP     [facility]MED[facility]  D:  08/12/2012  T:  08/12/2012  Job:  [id number]

## 2012-08-13 ENCOUNTER — Encounter (HOSPITAL_COMMUNITY): Payer: Self-pay | Admitting: Pulmonary Disease

## 2012-08-14 LAB — CULTURE, BAL-QUANTITATIVE W GRAM STAIN

## 2012-08-16 ENCOUNTER — Encounter: Payer: Self-pay | Admitting: Cardiology

## 2012-08-16 ENCOUNTER — Encounter: Payer: Self-pay | Admitting: *Deleted

## 2012-08-16 ENCOUNTER — Ambulatory Visit (INDEPENDENT_AMBULATORY_CARE_PROVIDER_SITE_OTHER): Payer: Medicare Other | Admitting: Cardiology

## 2012-08-16 VITALS — BP 119/70 | HR 66 | Ht 62.0 in | Wt 169.0 lb

## 2012-08-16 DIAGNOSIS — I5022 Chronic systolic (congestive) heart failure: Secondary | ICD-10-CM

## 2012-08-16 LAB — BASIC METABOLIC PANEL
BUN: 25 mg/dL — ABNORMAL HIGH (ref 6–23)
Creatinine, Ser: 1.3 mg/dL — ABNORMAL HIGH (ref 0.4–1.2)
GFR: 40.62 mL/min — ABNORMAL LOW (ref >60.00)
Glucose, Bld: 99 mg/dL (ref 70–99)

## 2012-08-16 NOTE — Patient Instructions (Addendum)
Your physician recommends that you schedule a follow-up appointment in 2 months with Dr Antoine Poche    Your physician recommends that you return for lab work today: BMP

## 2012-08-16 NOTE — Progress Notes (Signed)
HPI The patient presents for followup of dyspnea.  Since I last saw her she has been to [facility] and was told she did not have scleroderma to explain any pulmonary process. She's had a bronchoscopy to followup the abnormal PET scan and the results are pending. She did have weight gain on her home scales and did get increased dose of Lasix while I was out of town last week. Her weights have come back down. She interestingly did not notice a significant change in her breathing. She's not had any new PND or orthopnea. She's chronically dyspneic as previously reported. She's not had any new swelling.  Allergies  Allergen Reactions  . Bacitracin   . Imuran (Azathioprine)   . Metoclopramide Hcl     Reglan  . Nabumetone     Relafen  . Neomycin-Bacitracin Zn-Polymyx   . Nitroglycerin   . Nsaids   . Nutritional Supplements   . Plaquenil (Hydroxychloroquine Sulfate)     Current Outpatient Prescriptions  Medication Sig Dispense Refill  . Acetaminophen (TYLENOL EXTRA STRENGTH PO) Take 650 mg by mouth as needed.      . Ascorbic Acid (VITAMIN C) 500 MG tablet Take 1,000 mg by mouth daily.       Marland Kitchen aspirin 81 MG tablet Take 81 mg by mouth daily.        . Calcium Citrate-Vitamin D (CALCIUM CITRATE + D PO) Take by mouth daily.       . carvedilol (COREG) 3.125 MG tablet Take 1 tablet (3.125 mg total) by mouth 2 (two) times daily with a meal.  180 tablet  6  . diazepam (VALIUM) 5 MG tablet Take 5 mg by mouth daily.       Marland Kitchen docusate sodium (COLACE) 100 MG capsule daily.       . formoterol (FORADIL AEROLIZER) 12 MCG capsule for inhaler Place 1 capsule (12 mcg total) into inhaler and inhale 2 (two) times daily.  60 capsule  0  . furosemide (LASIX) 40 MG tablet Take 2 pills (80 mg) two times per day  90 tablet  3  . gabapentin (NEURONTIN) 300 MG capsule Take 300 mg by mouth 2 (two) times daily.       . Glucosamine-Chondroitin (OSTEO BI-FLEX REGULAR STRENGTH PO) Take 2 tablets by mouth daily.        Marland Kitchen  HYDROcodone-acetaminophen (NORCO) 10-325 MG per tablet Take 1 tablet by mouth every 6 (six) hours as needed for pain.      Marland Kitchen losartan (COZAAR) 25 MG tablet Take 12.5 mg by mouth 2 (two) times daily.      . Multiple Vitamin (MULTIVITAMIN) capsule Take 1 capsule by mouth daily.        . NON FORMULARY Mega Red daily       . omeprazole (PRILOSEC) 20 MG capsule Take 20 mg by mouth 2 (two) times daily.      Marland Kitchen oxybutynin (DITROPAN) 5 MG tablet Take 5 mg by mouth daily.        . potassium chloride SA (K-DUR,KLOR-CON) 20 MEQ tablet Take 2 tabs (40 mEq) two times per day  90 tablet  3  . pravastatin (PRAVACHOL) 40 MG tablet Take 40 mg by mouth daily.        . Psyllium (VEGETABLE LAXATIVE PO) as needed.        . triamcinolone cream (KENALOG) 0.1 % As needed      . Vitamin D, Ergocalciferol, (DRISDOL) 50000 UNITS CAPS Take 50,000 Units by mouth every  7 (seven) days.         No current facility-administered medications for this visit.    Past Medical History  Diagnosis Date  . LBBB (left bundle branch block)   . Nonischemic cardiomyopathy     EF 25-30% echo 2010, 45% echo 2012  . Hypertension   . Diverticulitis   . ICD (implantable cardiac defibrillator) in place     CRT-D [employer]  . Chronic fatigue   . Fibromyalgia   . Shortness of breath     followed by Dr. Shelle Iron  . Elevated uric acid   . CRI (chronic renal insufficiency)   . Abdominal bloating   . Osteopenia     Past Surgical History  Procedure Laterality Date  . Crt-d-st. jude's    . Replacement total knee    . Abdominal hysterectomy    . Appendectomy    . Neck surgery    . Tonsillectomy    . Neck surgery  02/2007    cervical  . Laser surgery left eye  2013  . Oral surgery/infection in root of teeth  2013  . Video bronchoscopy Bilateral 08/12/2012    Procedure: VIDEO BRONCHOSCOPY WITHOUT FLUORO;  Surgeon: Barbaraann Share, MD;  Location: [facility];  Service: Cardiopulmonary;  Laterality: Bilateral;    ROS: As stated in the  HPI and negative for all other systems.  PHYSICAL EXAM BP 119/70  Pulse 66  Ht 5\' 2"  (1.575 m)  Wt 169 lb (76.658 kg)  BMI 30.9 kg/m2 PHYSICAL EXAM GEN:  No distress NECK:  No jugular venous distention at 90 degrees, waveform within normal limits, carotid upstroke brisk and symmetric, no bruits, no thyromegaly LUNGS:  Clear to auscultation bilaterally BACK:  No CVA tenderness CHEST:  Unremarkable HEART:  S1 and S2 within normal limits, no S3, no S4, no clicks, no rubs, no murmurs ABD:  Positive bowel sounds normal in frequency in pitch, no bruits, no rebound, no guarding, unable to assess midline mass or bruit with the patient seated. EXT:  2 plus pulses throughout, mild edema, no cyanosis no clubbing SKIN:  No rashes no nodules   ASSESSMENT AN  PLAN  HYPOTENSION -  She tolerated the titration of Cozaar at the last visit. She will remain on the meds as listed.  SYSTOLIC HEART FAILURE, CHRONIC -  Her weight is back down. I don't see any obvious signs of volume excess. She will continue the meds as listed. I will check a basic metabolic profile today.  DYSPNEA - The etiology of this seems to be multifactorial.   She will followup with Dr. Shelle Iron to review the results of the bronchoscopy.

## 2012-08-18 ENCOUNTER — Ambulatory Visit (INDEPENDENT_AMBULATORY_CARE_PROVIDER_SITE_OTHER): Payer: Medicare Other | Admitting: Pulmonary Disease

## 2012-08-18 ENCOUNTER — Encounter: Payer: Self-pay | Admitting: Pulmonary Disease

## 2012-08-18 DIAGNOSIS — R0600 Dyspnea, unspecified: Secondary | ICD-10-CM

## 2012-08-18 DIAGNOSIS — J438 Other emphysema: Secondary | ICD-10-CM

## 2012-08-18 DIAGNOSIS — R0609 Other forms of dyspnea: Secondary | ICD-10-CM

## 2012-08-18 DIAGNOSIS — J439 Emphysema, unspecified: Secondary | ICD-10-CM

## 2012-08-18 DIAGNOSIS — C3430 Malignant neoplasm of lower lobe, unspecified bronchus or lung: Secondary | ICD-10-CM | POA: Insufficient documentation

## 2012-08-18 NOTE — Patient Instructions (Addendum)
Will refer you to a thoracic oncologist to discuss your new diagnosis of lung cancer. Stay on bilevel as you are doing. Check to see if foradil or arcapta are cheaper on your pharmacy plan. Would like to see you back in 4 mos to check on your breathing, but let me know if changes before then.

## 2012-08-18 NOTE — Assessment & Plan Note (Signed)
The patient has a new diagnosis of non-small cell carcinoma from her recent left lower lobe brushing and biopsy.  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  Her most recent CT in 2014 showed that it had increased in size.  I have reviewed the pathology with the patient, and explained that surgery versus radiation/chemotherapy or her best treatment options.  I really do not think she is a good surgical candidate, and she agrees with this.  I would like for her to see a medical oncologist to discuss further treatment and she is willing to do this.

## 2012-08-18 NOTE — Assessment & Plan Note (Signed)
The patient has dyspnea on exertion related to respiratory muscle weakness of unknown origin, as well as mild airflow obstruction.  She thinks the long-acting beta agonist has helped her, as well as the bilevel.  She also has a known cardiomyopathy which contributes to this.  I have asked her to stay on her bilevel device, as well as her bronchodilator regimen.

## 2012-08-18 NOTE — Progress Notes (Signed)
  Subjective:    Patient ID: Heather Bullock, female    DOB: [birth year], 79 y.o.   MRN: [id number]  HPI The patient comes in today for followup of her recent bronchoscopy.  She has had increasing left lower lobe density, and transbronchial biopsies and brushes were positive for non-small cell carcinoma.  I have reviewed the pathology with the patient and her son, and answered all of their questions.  The patient has been wearing bilevel compliantly for her neuromuscular weakness, and feels that she is greatly benefiting from the device.  She is having no issues with her mask fit or pressure.   Review of Systems Unremarkable except for dyspnea on exertion.    Objective:   Physical Exam Well-developed female in no acute distress Nose without purulence or discharge noted No skin breakdown or pressure necrosis from the CPAP mask Chest with decreased depth of inspiration, bibasilar crackles, no wheezing Cardiac exam with regular rate and rhythm Lower extremities with mild edema, no cyanosis Alert and oriented, moves all 4 extremities.       Assessment & Plan:

## 2012-08-19 ENCOUNTER — Ambulatory Visit: Payer: Medicare Other | Admitting: Cardiology

## 2012-08-23 ENCOUNTER — Telehealth: Payer: Self-pay | Admitting: Pulmonary Disease

## 2012-08-23 NOTE — Telephone Encounter (Signed)
According to Epic referral was sent on 08-18-12. I called Tiffany at [phone], new pt referral scheduler, and LMTCB to advise [place] the status of pt appt. Pt is aware as well. Carron Curie, CMA

## 2012-08-24 NOTE — Telephone Encounter (Signed)
I spoke with pt and she is aware. Nothing further was needed

## 2012-08-24 NOTE — Telephone Encounter (Signed)
Tiffany returnd call. She states that they DO have the referral but have not scheduled this yet. She says the pcc will schedule soon for MTOC or with dr Shirline Frees this week. Heather Bullock

## 2012-08-24 NOTE — Telephone Encounter (Signed)
LMOMTCB for Tiffany scheduler at cancer center.

## 2012-08-25 ENCOUNTER — Telehealth: Payer: Self-pay | Admitting: Pulmonary Disease

## 2012-08-25 NOTE — Telephone Encounter (Signed)
PCC's who schedule's appt's for MTOC?

## 2012-08-25 NOTE — Telephone Encounter (Signed)
According to the referral, the referral was faxed to [facility] at [facility] and she scheduled the appointment. I called and spoke with Annabelle Harman and she stated that she hasn't contacted the patient yet, however, she had planned to contact patient tomorrow with this appointment. Rhonda J Cobb

## 2012-08-25 NOTE — Telephone Encounter (Signed)
I spoke with pt and I advised her that dana will be giving her a call with all this information tomorrow. I advised her they will advise her of everything she needed to know. She voiced her understanding and needed nothing further.

## 2012-08-26 ENCOUNTER — Other Ambulatory Visit: Payer: Self-pay | Admitting: Pulmonary Disease

## 2012-08-26 ENCOUNTER — Telehealth: Payer: Self-pay | Admitting: *Deleted

## 2012-08-26 NOTE — Telephone Encounter (Signed)
Spoke with pt regarding appt for mtoc 09/02/12.  She verbalized understanding of time and place of appt

## 2012-08-27 ENCOUNTER — Other Ambulatory Visit (HOSPITAL_COMMUNITY)
Admission: RE | Admit: 2012-08-27 | Discharge: 2012-08-27 | Disposition: A | Discharge status: Home / Self Care | Payer: Medicare Other | Source: Ambulatory Visit | Attending: Internal Medicine | Admitting: Internal Medicine

## 2012-08-27 DIAGNOSIS — C3490 Malignant neoplasm of unspecified part of unspecified bronchus or lung: Secondary | ICD-10-CM | POA: Insufficient documentation

## 2012-08-30 ENCOUNTER — Telehealth: Payer: Self-pay | Admitting: *Deleted

## 2012-08-30 ENCOUNTER — Telehealth: Payer: Self-pay | Admitting: Pulmonary Disease

## 2012-08-30 MED ORDER — FORMOTEROL FUMARATE 12 MCG IN CAPS: 12.0000 ug | ORAL_CAPSULE | Freq: Two times a day (BID) | RESPIRATORY_TRACT | Status: AC

## 2012-08-30 NOTE — Telephone Encounter (Signed)
I spoke with the pt and she needs refill on foradil, refill sent. She also states that she had an MTOC appt on 09-02-12 but according to mychart this has been cancelled. She states she has not been contacted about this and is confused as to why this has been cancelled. She states she has called an LM but has not been called back yet. I advised that we will look into this and let her know. I called Willette Pa, thoracic coordinator at [phone] an LMTCB. Carron Curie, CMA

## 2012-08-30 NOTE — Telephone Encounter (Signed)
Left VM message regarding appt for mtoc 09/02/12.  Unfortunately, with EPIC scheduling it looks like it is cancelled but is not.  I clarified time and place of appt

## 2012-08-30 NOTE — Telephone Encounter (Signed)
Dup. Closing encounter and typing new one. Heather Bullock

## 2012-08-31 ENCOUNTER — Ambulatory Visit: Payer: Medicare Other | Admitting: Cardiology

## 2012-08-31 NOTE — Telephone Encounter (Signed)
Per Annabelle Harman 08/30/12:  Call Documentation    Alla Feeling, RN at 08/30/2012 1:47 PM    Status: Signed             Left VM message regarding appt for mtoc 09/02/12. Unfortunately, with EPIC scheduling it looks like it is cancelled but is not. I clarified time and place of appt     Spoke with patient, she states she received Dana's message. Appt is still scheduled. Patient verbalized understanding and nothing further needed at this time.

## 2012-09-01 ENCOUNTER — Telehealth: Payer: Self-pay | Admitting: *Deleted

## 2012-09-01 NOTE — Telephone Encounter (Signed)
Spoke with pt regarding appt for MTOC and to bring pacemaker information

## 2012-09-02 ENCOUNTER — Encounter: Payer: Self-pay | Admitting: *Deleted

## 2012-09-02 ENCOUNTER — Telehealth: Payer: Self-pay | Admitting: *Deleted

## 2012-09-02 ENCOUNTER — Ambulatory Visit
Admission: RE | Admit: 2012-09-02 | Discharge: 2012-09-02 | Disposition: A | Discharge status: Home / Self Care | Payer: Medicare Other | Source: Ambulatory Visit | Attending: Radiation Oncology | Admitting: Radiation Oncology

## 2012-09-02 NOTE — Progress Notes (Signed)
   Thoracic Treatment Summary Name: Aleza Pew Date: 09/02/12 DOB: 09/10/1928 Your Medical Team Medical Oncologist: Radiation Oncologist: Dr. Mitzi Hansen Pulmonologist: Surgeon: Type and Stage of Lung Cancer Non-Small Cell Carcinoma: Adenocarcinoma  Clinical Stage: I Pathological Stage:  Clinical stage is based on radiology exams.  Pathological stage will be determined after surgery.  Staging is based on the size of the tumor, involvement of lymph nodes or not, and whether or not the cancer center has spread. Recommendations Recommendations: Radiation therapy  These recommendations are based on information available as of today's consult.  This is subject to change depending further testing or exams. Next Steps Next Step: 1. Cancer Center will call with appt 2.  3. 4. Barriers to Care What do you perceive as a potential barrier that may prevent you from receiving your treatment plan? Medical issues pt perceives barrier to care.  We discussed physical conservation before appointments  Information given and explained on lung cancer and resources at the cancer center Questions Willette Pa, RN BSN Thoracic Oncology Nurse Navigator at [phone]  Annabelle Harman is a nurse navigator that is available to assist you through your cancer journey.  She can answer your questions and/or provide resources regarding your treatment plan, emotional support, or financial concerns.

## 2012-09-02 NOTE — Telephone Encounter (Signed)
Spoke with pt regarding appt.  Pt was confused about my chart appt for 4pm not as she was told by me at 1:30.  I let her know the appt was at 1:30

## 2012-09-02 NOTE — Progress Notes (Signed)
[facility] MTOC  Clinical Social Work   Clinical Social Work met with patient, patient's daughter and son-in-law in exam room during MTOC today. Ms. Santini reports feeling very confident in her healthcare team and the treatment plan that has been discussed. The patient had questions regarding home care resources and discussed having multiple medical issues.  CSW briefly discussed CSW role and support programs with patient and spouse.   Kathrin Penner, MSW, LCSW  Clinical Social Worker  [facility]  [phone]

## 2012-09-03 ENCOUNTER — Telehealth: Payer: Self-pay | Admitting: *Deleted

## 2012-09-03 ENCOUNTER — Encounter: Payer: Self-pay | Admitting: *Deleted

## 2012-09-03 NOTE — Telephone Encounter (Signed)
Called pt to follow up from mtoc 09/02/12.  Unable to leave message 

## 2012-09-06 ENCOUNTER — Other Ambulatory Visit: Payer: Self-pay

## 2012-09-06 MED ORDER — LOSARTAN POTASSIUM 25 MG PO TABS: 12.5000 mg | ORAL_TABLET | Freq: Two times a day (BID) | ORAL | Status: DC

## 2012-09-06 NOTE — Telephone Encounter (Signed)
..   Requested Prescriptions   Signed Prescriptions Disp Refills  . losartan (COZAAR) 25 MG tablet 90 tablet 3    Sig: Take 0.5 tablets (12.5 mg total) by mouth 2 (two) times daily.    Authorizing Provider: Rollene Rotunda    Ordering User: Christella Hartigan, Talal Fritchman Judie Petit

## 2012-09-07 ENCOUNTER — Encounter: Payer: Self-pay | Admitting: *Deleted

## 2012-09-07 NOTE — Progress Notes (Signed)
Radiation Oncology         [phone] ________________________________  Name: Heather Bullock MRN: [id number]  Date: 09/02/2012  DOB: [birth year]  WU:[id number],NWGNFA R, MD  Clance, Maree Krabbe, MD     REFERRING PHYSICIAN: Shelle Iron Maree Krabbe, MD   DIAGNOSIS: The encounter diagnosis was Lung cancer, lower lobe, left.   HISTORY OF PRESENT ILLNESS::Heather Bullock is a 79 y.o. female who is seen for an initial consultation visit. The patient has had a history of some dyspnea on exertion. This has been associated with significant muscle weakness. The patient also has chronic congestive heart failure from cardiomyopathy. There was a question raised regarding possible interstitial lung disease and the patient therefore proceeded to undergo a CT scan of the chest earlier this year in March. This did not reveal interstitial lung disease. A key finding was increasing masslike pleural-based opacity in the medial aspect of the left lower lobe. This had significantly enlarged as compared to prior study on 11/27/2009.  The patient therefore underwent a PET scan. This showed that the left lower lobe lung mass was hypermetabolic with a maximum SUV of 9.8. There also was a hypermetabolic left hilar node adjacent to the esophagus with a maximum SUV of 5.3. This was consistent with primary bronchogenic carcinoma. The patient proceeded with flexible fiberoptic bronchoscopy and biopsy on 08/12/2012. Proximally, the airway was normal but there was significant narrowing of the main lower lobe bronchus. Multiple biopsies were obtained and the pathology revealed non-small cell lung cancer. Additional immunohistochemistry suggested a primary lung adenocarcinoma.  The patient's case has been discussed in multidisciplinary thoracic conference this morning. She is not felt to be a good candidate for surgery given her comorbidities and therefore I have been asked to see the patient today for potential radiotherapy.  The patient today indicates  that she is not experiencing any chest discomfort. She does have some dyspnea on exertion. She feels that this has worsened in the last 6-12 months but no recent changes.   PREVIOUS RADIATION THERAPY: No   PAST MEDICAL HISTORY:  has a past medical history of LBBB (left bundle branch block); Nonischemic cardiomyopathy; Hypertension; Diverticulitis; ICD (implantable cardiac defibrillator) in place; Chronic fatigue; Fibromyalgia; Shortness of breath; Elevated uric acid; CRI (chronic renal insufficiency); Abdominal bloating; and Osteopenia.     PAST SURGICAL HISTORY: Past Surgical History  Procedure Laterality Date  . Crt-d-st. jude's    . Replacement total knee    . Abdominal hysterectomy    . Appendectomy    . Neck surgery    . Tonsillectomy    . Neck surgery  02/2007    cervical  . Laser surgery left eye  2013  . Oral surgery/infection in root of teeth  2013  . Video bronchoscopy Bilateral 08/12/2012    Procedure: VIDEO BRONCHOSCOPY WITHOUT FLUORO;  Surgeon: Barbaraann Share, MD;  Location: [facility];  Service: Cardiopulmonary;  Laterality: Bilateral;     FAMILY HISTORY: family history includes Colon cancer in her sister; Diabetes in her brother; Heart disease in her brother, mother, and son; Hypertension in her mother; and Lung cancer in her father.   SOCIAL HISTORY:  reports that she has never smoked. She has never used smokeless tobacco. She reports that she does not drink alcohol or use illicit drugs.   ALLERGIES: Bacitracin; Imuran; Metoclopramide hcl; Nabumetone; Neomycin-bacitracin zn-polymyx; Nitroglycerin; Nsaids; Nutritional supplements; and Plaquenil   MEDICATIONS:  Current Outpatient Prescriptions  Medication Sig Dispense Refill  . Acetaminophen (TYLENOL EXTRA STRENGTH PO) Take 650  mg by mouth as needed.      . Ascorbic Acid (VITAMIN C) 500 MG tablet Take 1,000 mg by mouth daily.       Marland Kitchen aspirin 81 MG tablet Take 81 mg by mouth daily.        . Calcium  Citrate-Vitamin D (CALCIUM CITRATE + D PO) Take by mouth daily.       . carvedilol (COREG) 3.125 MG tablet Take 1 tablet (3.125 mg total) by mouth 2 (two) times daily with a meal.  180 tablet  6  . diazepam (VALIUM) 5 MG tablet Take 5 mg by mouth every 6 (six) hours as needed.       . docusate sodium (COLACE) 100 MG capsule daily.       . formoterol (FORADIL AEROLIZER) 12 MCG capsule for inhaler Place 1 capsule (12 mcg total) into inhaler and inhale 2 (two) times daily.  60 capsule  6  . furosemide (LASIX) 40 MG tablet Take 2 pills (80 mg) two times per day  90 tablet  3  . gabapentin (NEURONTIN) 300 MG capsule Take 300 mg by mouth 2 (two) times daily.       . Glucosamine-Chondroitin (OSTEO BI-FLEX REGULAR STRENGTH PO) Take 2 tablets by mouth daily.        Marland Kitchen HYDROcodone-acetaminophen (NORCO) 10-325 MG per tablet Take 1 tablet by mouth every 6 (six) hours as needed for pain.      Marland Kitchen losartan (COZAAR) 25 MG tablet Take 0.5 tablets (12.5 mg total) by mouth 2 (two) times daily.  90 tablet  3  . Multiple Vitamin (MULTIVITAMIN) capsule Take 1 capsule by mouth daily.        . NON FORMULARY Mega Red daily       . omeprazole (PRILOSEC) 20 MG capsule Take 20 mg by mouth 2 (two) times daily.      Marland Kitchen oxybutynin (DITROPAN) 5 MG tablet Take 5 mg by mouth daily.        . potassium chloride SA (K-DUR,KLOR-CON) 20 MEQ tablet Take 2 tabs (40 mEq) two times per day  90 tablet  3  . pravastatin (PRAVACHOL) 40 MG tablet Take 40 mg by mouth daily.        . Psyllium (VEGETABLE LAXATIVE PO) as needed.        . triamcinolone cream (KENALOG) 0.1 % As needed      . Vitamin D, Ergocalciferol, (DRISDOL) 50000 UNITS CAPS Take 50,000 Units by mouth every 7 (seven) days.         No current facility-administered medications for this encounter.     REVIEW OF SYSTEMS:  A 15 point review of systems is documented in the electronic medical record. This was obtained by the nursing staff. However, I reviewed this with the patient to  discuss relevant findings and make appropriate changes.  Pertinent items are noted in HPI.    PHYSICAL EXAM:  vitals were not taken for this visit.  General: Well-developed, in no acute distress, sitting in a wheelchair HEENT: Normocephalic, atraumatic; oral cavity clear Neck: Supple without any lymphadenopathy Cardiovascular: Regular rate and rhythm Respiratory: Clear to auscultation bilaterally; decreased breath sounds in left base GI: Soft, nontender, normal bowel sounds Extremities: No edema present Neuro: No focal deficits    LABORATORY DATA:  Lab Results  Component Value Date   WBC 10.1 02/18/2011   HGB 13.2 02/18/2011   HCT 39.2 02/18/2011   MCV 96.4 02/18/2011   PLT 292.0 02/18/2011   Lab Results  Component Value Date   NA 138 08/16/2012   K 4.5 08/16/2012   CL 99 08/16/2012   CO2 29 08/16/2012   No results found for this basename: ALT, AST, GGT, ALKPHOS, BILITOT      RADIOGRAPHY: Dg Chest Port 1 View  08/12/2012  *RADIOLOGY REPORT*  Clinical Data: Status post left lower lobe bronchoscopy with biopsy.  PORTABLE CHEST - 1 VIEW  Comparison: Head CT 07/13/2012.  Radiographs 12/26/2011.  Findings: 0846 hours.  The left subclavian pacemaker / AICD leads appear unchanged.  There is stable cardiomegaly and aortic atherosclerosis.  Retrocardiac left lower lobe opacity and blunting of the left costophrenic angle are stable.  There is no new airspace disease or pneumothorax.  Interstitial prominence throughout both lungs appears unchanged.  Patient is status post lower cervical fusion.  IMPRESSION: No demonstrated complication following bronchoscopy.  Stable generalized interstitial prominence and retrocardiac pulmonary density.   Original Report Authenticated By: Carey Bullocks, M.D.        IMPRESSION: The patient has a recent diagnosis of non-small cell lung cancer. Clinically, she has a T2a N1 M0 tumor (Stage IIA).  The patient is not for surgical resection given the  significant comorbidities. In conference, it was not felt that chemotherapy would be beneficial for the patient. I discussed with the patient a potential course of definitive radiotherapy. I believe that this would be a reasonable consideration for her. We discussed the pros and cons of this treatment versus further waiting/observation.  We discussed the rationale of radiotherapy in this setting. We also discussed the potential side effects and risks of treatment. We extensively discussed the issue of potential increased shortness of breath with radiotherapy.  At the end of this discussion the patient indicated that she didn't wish to proceed with treatment. She is concerned about the possible length of treatment if this were to be given over a number of weeks. We will further discuss this issue further.    PLAN: The patient will be scheduled for a simulation as soon as this can be arranged. This is not an urgent matter but hopefully the patient will be able to come to clinic within the next week such that we can proceed with treatment planning.    I spent 60 minutes minutes face to face with the patient and more than 50% of that time was spent in counseling and/or coordination of care.    ________________________________   Radene Gunning, MD, PhD

## 2012-09-07 NOTE — Progress Notes (Signed)
Fax received 09/07/12 from [facility] lab indicated EGFR Mutation: Insufficient material received for this assay

## 2012-09-08 ENCOUNTER — Encounter: Payer: Self-pay | Admitting: Radiation Oncology

## 2012-09-08 ENCOUNTER — Ambulatory Visit
Admission: RE | Admit: 2012-09-08 | Discharge: 2012-09-08 | Disposition: A | Discharge status: Home / Self Care | Payer: Medicare Other | Source: Ambulatory Visit | Attending: Radiation Oncology | Admitting: Radiation Oncology

## 2012-09-08 DIAGNOSIS — R5383 Other fatigue: Secondary | ICD-10-CM | POA: Insufficient documentation

## 2012-09-08 DIAGNOSIS — Z7982 Long term (current) use of aspirin: Secondary | ICD-10-CM | POA: Insufficient documentation

## 2012-09-08 DIAGNOSIS — R110 Nausea: Secondary | ICD-10-CM | POA: Insufficient documentation

## 2012-09-08 DIAGNOSIS — C3490 Malignant neoplasm of unspecified part of unspecified bronchus or lung: Secondary | ICD-10-CM | POA: Insufficient documentation

## 2012-09-08 DIAGNOSIS — C3430 Malignant neoplasm of lower lobe, unspecified bronchus or lung: Secondary | ICD-10-CM | POA: Insufficient documentation

## 2012-09-08 DIAGNOSIS — R070 Pain in throat: Secondary | ICD-10-CM | POA: Insufficient documentation

## 2012-09-08 DIAGNOSIS — Y842 Radiological procedure and radiotherapy as the cause of abnormal reaction of the patient, or of later complication, without mention of misadventure at the time of the procedure: Secondary | ICD-10-CM | POA: Insufficient documentation

## 2012-09-08 DIAGNOSIS — R12 Heartburn: Secondary | ICD-10-CM | POA: Insufficient documentation

## 2012-09-08 DIAGNOSIS — R1013 Epigastric pain: Secondary | ICD-10-CM | POA: Insufficient documentation

## 2012-09-08 DIAGNOSIS — K3189 Other diseases of stomach and duodenum: Secondary | ICD-10-CM | POA: Insufficient documentation

## 2012-09-08 DIAGNOSIS — Z79899 Other long term (current) drug therapy: Secondary | ICD-10-CM | POA: Insufficient documentation

## 2012-09-08 DIAGNOSIS — R5381 Other malaise: Secondary | ICD-10-CM | POA: Insufficient documentation

## 2012-09-08 DIAGNOSIS — K208 Other esophagitis without bleeding: Secondary | ICD-10-CM | POA: Insufficient documentation

## 2012-09-08 DIAGNOSIS — Z510 Encounter for antineoplastic radiation therapy: Secondary | ICD-10-CM | POA: Insufficient documentation

## 2012-09-08 MED ORDER — SODIUM CHLORIDE 0.9 % IJ SOLN
10.0000 mL | INTRAMUSCULAR | Status: DC | PRN
  Administered 2012-09-08: 10 mL via INTRAVENOUS

## 2012-09-08 NOTE — Progress Notes (Signed)
Ms. Limberg here for simulation.  Placed a 22 gauage IV in her right AC.  Had blood return.  Notified Sim that she is ready.  BUN 25 and creatinine 1.3.  OK to receive contrast dye per Dr. Mitzi Hansen.

## 2012-09-08 NOTE — Progress Notes (Signed)
Pacemaker form filled out and forwarded to Dr.Klein's office (devices department) to be completed.

## 2012-09-08 NOTE — Progress Notes (Signed)
Gave billing sheet and business card to family member and told them should Heather Bullock have any questions to please give me a call or schedule a time to come by.

## 2012-09-10 LAB — FUNGUS CULTURE W SMEAR
Fungal Smear: NONE SEEN
Special Requests: NORMAL

## 2012-09-14 ENCOUNTER — Other Ambulatory Visit: Payer: Self-pay

## 2012-09-14 DIAGNOSIS — Z1231 Encounter for screening mammogram for malignant neoplasm of breast: Secondary | ICD-10-CM

## 2012-09-15 ENCOUNTER — Ambulatory Visit
Admission: RE | Admit: 2012-09-15 | Discharge: 2012-09-15 | Disposition: A | Discharge status: Home / Self Care | Payer: Medicare Other | Source: Ambulatory Visit | Attending: Radiation Oncology | Admitting: Radiation Oncology

## 2012-09-16 ENCOUNTER — Ambulatory Visit
Admission: RE | Admit: 2012-09-16 | Discharge: 2012-09-16 | Disposition: A | Discharge status: Home / Self Care | Payer: Medicare Other | Source: Ambulatory Visit | Attending: Radiation Oncology | Admitting: Radiation Oncology

## 2012-09-16 ENCOUNTER — Ambulatory Visit: Payer: Medicare Other | Admitting: Radiation Oncology

## 2012-09-17 ENCOUNTER — Ambulatory Visit: Admission: RE | Admit: 2012-09-17 | Payer: Medicare Other | Source: Ambulatory Visit | Admitting: Radiation Oncology

## 2012-09-17 ENCOUNTER — Ambulatory Visit: Payer: Medicare Other

## 2012-09-17 ENCOUNTER — Ambulatory Visit
Admission: RE | Admit: 2012-09-17 | Discharge: 2012-09-17 | Disposition: A | Discharge status: Home / Self Care | Payer: Medicare Other | Source: Ambulatory Visit | Attending: Radiation Oncology | Admitting: Radiation Oncology

## 2012-09-17 ENCOUNTER — Encounter: Payer: Self-pay | Admitting: Radiation Oncology

## 2012-09-17 MED ORDER — RADIAPLEXRX EX GEL
Freq: Once | CUTANEOUS | Status: AC
  Administered 2012-09-17: 16:00:00 via TOPICAL

## 2012-09-17 NOTE — Progress Notes (Signed)
Department of Radiation Oncology  Phone:  [phone] Fax:        [phone]  Weekly Treatment Note    Name: Heather Bullock Date: 09/17/2012 MRN: [id number] DOB: Aug 30, 1928   Current dose: 5.4 Gy  Current fraction: 2   MEDICATIONS: Current Outpatient Prescriptions  Medication Sig Dispense Refill  . Acetaminophen (TYLENOL EXTRA STRENGTH PO) Take 650 mg by mouth as needed.      . Ascorbic Acid (VITAMIN C) 500 MG tablet Take 1,000 mg by mouth daily.       Marland Kitchen aspirin 81 MG tablet Take 81 mg by mouth daily.        . Calcium Citrate-Vitamin D (CALCIUM CITRATE + D PO) Take by mouth daily.       . carvedilol (COREG) 3.125 MG tablet Take 1 tablet (3.125 mg total) by mouth 2 (two) times daily with a meal.  180 tablet  6  . diazepam (VALIUM) 5 MG tablet Take 5 mg by mouth every 6 (six) hours as needed.       . diclofenac (FLECTOR) 1.3 % PTCH Place 1 patch onto the skin 2 (two) times daily.      Marland Kitchen docusate sodium (COLACE) 100 MG capsule daily.       . formoterol (FORADIL AEROLIZER) 12 MCG capsule for inhaler Place 1 capsule (12 mcg total) into inhaler and inhale 2 (two) times daily.  60 capsule  6  . furosemide (LASIX) 40 MG tablet Take 2 pills (80 mg) two times per day  90 tablet  3  . gabapentin (NEURONTIN) 300 MG capsule Take 300 mg by mouth 2 (two) times daily.       . Glucosamine-Chondroitin (OSTEO BI-FLEX REGULAR STRENGTH PO) Take 2 tablets by mouth daily.        Marland Kitchen HYDROcodone-acetaminophen (NORCO) 10-325 MG per tablet Take 1 tablet by mouth every 6 (six) hours as needed for pain.      Marland Kitchen losartan (COZAAR) 25 MG tablet Take 0.5 tablets (12.5 mg total) by mouth 2 (two) times daily.  90 tablet  3  . Multiple Vitamin (MULTIVITAMIN) capsule Take 1 capsule by mouth daily.        . NON FORMULARY Mega Red daily       . omeprazole (PRILOSEC) 20 MG capsule Take 20 mg by mouth 2 (two) times daily.      Marland Kitchen oxybutynin (DITROPAN) 5 MG tablet Take 5 mg by mouth daily.        . potassium chloride SA  (K-DUR,KLOR-CON) 20 MEQ tablet Take 2 tabs (40 mEq) two times per day  90 tablet  3  . pravastatin (PRAVACHOL) 40 MG tablet Take 40 mg by mouth daily.        . Psyllium (VEGETABLE LAXATIVE PO) as needed.        . triamcinolone cream (KENALOG) 0.1 % As needed      . Vitamin D, Ergocalciferol, (DRISDOL) 50000 UNITS CAPS Take 50,000 Units by mouth every 7 (seven) days.         Current Facility-Administered Medications  Medication Dose Route Frequency Provider Last Rate Last Dose  . hyaluronate sodium (RADIAPLEXRX) gel   Topical Once Jonna Coup, MD         ALLERGIES: Bacitracin; Imuran; Metoclopramide hcl; Nabumetone; Neomycin-bacitracin zn-polymyx; Nitroglycerin; Nsaids; Nutritional supplements; and Plaquenil   LABORATORY DATA:  Lab Results  Component Value Date   WBC 10.1 02/18/2011   HGB 13.2 02/18/2011   HCT 39.2 02/18/2011   MCV 96.4 02/18/2011  PLT 292.0 02/18/2011   Lab Results  Component Value Date   NA 138 08/16/2012   K 4.5 08/16/2012   CL 99 08/16/2012   CO2 29 08/16/2012   No results found for this basename: ALT, AST, GGT, ALKPHOS, BILITOT     NARRATIVE: Heather Bullock was seen today for weekly treatment management. The chart was checked and the patient's films were reviewed. The patient complains of significant fatigue today. No other complaints. This is her major concern for treatment.  PHYSICAL EXAMINATION: height is 5\' 2"  (1.575 m) and weight is 170 lb (77.111 kg). Her temperature is 98 F (36.7 C). Her blood pressure is 135/70 and her pulse is 45. Her oxygen saturation is 97%.        ASSESSMENT: The patient is doing satisfactorily with treatment.  PLAN: We will continue with the patient's radiation treatment as planned.

## 2012-09-17 NOTE — Progress Notes (Addendum)
  Radiation Oncology         [phone] ________________________________  Name: Heather Bullock MRN: [id number]  Date: 09/08/2012  DOB: Sep 21, 1928  SIMULATION AND TREATMENT PLANNING NOTE  DIAGNOSIS:  Lung cancer  NARRATIVE:  The patient was brought to the CT Simulation planning suite.  Identity was confirmed.  All relevant records and images related to the planned course of therapy were reviewed.   Written consent to proceed with treatment was confirmed which was freely given after reviewing the details related to the planned course of therapy had been reviewed with the patient.  Then, the patient was set-up in a stable reproducible supine position for radiation therapy.  The patient's arms were raised above using a wing board device. A customized VAC lock bag was also constructed to be in patient immobilization and given her fatigue/weakness. This complex treatment device will be used daily. CT images were obtained.  An isocenter was placed within the chest in relation to the target volume. Surface markings were placed.    The CT images were loaded into the planning software.  Then the target and avoidance structures were contoured.  Treatment planning then occurred.  The radiation prescription was entered and confirmed.  A total of 5 complex treatment devices were fabricated which correspond to the designed customized radiation treatment fields. Each of these customized fields/ complex treatment devices will be used on a daily basis during the radiation course. I have requested a 3D Simulation.  I have requested a DVH of the following structures: target volume, spinal cord, lungs, esophagus.   Daily image guidance will be used to ensure accurate localization of the target. This will also be medically necessary to ensure adequate sparing of nearby critical normal structures including the spinal cord and heart in particular.  PLAN:  The patient will initially receive 70.2 Gy in 26 fractions.   No  chemotherapy will be given during this treatment.     ________________________________   Radene Gunning, MD, PhD

## 2012-09-17 NOTE — Addendum Note (Signed)
Encounter addended by: Eduardo Osier, RN on: 09/17/2012  4:22 PM<BR>     Documentation filed: Inpatient MAR

## 2012-09-21 ENCOUNTER — Ambulatory Visit: Payer: Medicare Other

## 2012-09-21 ENCOUNTER — Ambulatory Visit
Admission: RE | Admit: 2012-09-21 | Discharge: 2012-09-21 | Disposition: A | Discharge status: Home / Self Care | Payer: Medicare Other | Source: Ambulatory Visit | Attending: Radiation Oncology | Admitting: Radiation Oncology

## 2012-09-22 ENCOUNTER — Ambulatory Visit
Admission: RE | Admit: 2012-09-22 | Discharge: 2012-09-22 | Disposition: A | Discharge status: Home / Self Care | Payer: Medicare Other | Source: Ambulatory Visit | Attending: Radiation Oncology | Admitting: Radiation Oncology

## 2012-09-22 ENCOUNTER — Ambulatory Visit: Payer: Medicare Other

## 2012-09-23 ENCOUNTER — Ambulatory Visit
Admission: RE | Admit: 2012-09-23 | Discharge: 2012-09-23 | Disposition: A | Discharge status: Home / Self Care | Payer: Medicare Other | Source: Ambulatory Visit | Attending: Radiation Oncology | Admitting: Radiation Oncology

## 2012-09-23 ENCOUNTER — Ambulatory Visit: Payer: Medicare Other

## 2012-09-24 ENCOUNTER — Ambulatory Visit
Admission: RE | Admit: 2012-09-24 | Discharge: 2012-09-24 | Disposition: A | Discharge status: Home / Self Care | Payer: Medicare Other | Source: Ambulatory Visit | Attending: Radiation Oncology | Admitting: Radiation Oncology

## 2012-09-24 ENCOUNTER — Ambulatory Visit: Payer: Medicare Other

## 2012-09-24 MED ORDER — SUCRALFATE 1 G PO TABS: 1.0000 g | ORAL_TABLET | Freq: Four times a day (QID) | ORAL | Status: DC

## 2012-09-24 NOTE — Progress Notes (Signed)
Ms. Weismann here for with her daughter for weekly under treat visit.  She has had 6/26 fractions to her left lung.  She does have generalized pain that she is rating at a 5/10.  She is reporting a dry cough that started at the beginning of the weak.  She is also having heartburn.  She had it before she started treatment but now it is worse.  She says she is having trouble swallowing and feels like there is a knot in her throat.  She is fatigued.  She also has shortness of breath with activity and talking. Her skin is intact.  She is applying radiaplex gel twice a day.

## 2012-09-24 NOTE — Progress Notes (Signed)
Department of Radiation Oncology  Phone:  [phone] Fax:        [phone]  Weekly Treatment Note    Name: Heather Bullock Date: 09/24/2012 MRN: [id number] DOB: 1928/12/25   Current dose: 16.2 Gy  Current fraction: 6   MEDICATIONS: Current Outpatient Prescriptions  Medication Sig Dispense Refill  . Acetaminophen (TYLENOL EXTRA STRENGTH PO) Take 650 mg by mouth as needed.      . Ascorbic Acid (VITAMIN C) 500 MG tablet Take 1,000 mg by mouth daily.       Marland Kitchen aspirin 81 MG tablet Take 81 mg by mouth daily.        . Calcium Citrate-Vitamin D (CALCIUM CITRATE + D PO) Take by mouth daily.       . carvedilol (COREG) 3.125 MG tablet Take 1 tablet (3.125 mg total) by mouth 2 (two) times daily with a meal.  180 tablet  6  . diazepam (VALIUM) 5 MG tablet Take 5 mg by mouth every 6 (six) hours as needed.       . diclofenac (FLECTOR) 1.3 % PTCH Place 1 patch onto the skin 2 (two) times daily.      Marland Kitchen docusate sodium (COLACE) 100 MG capsule daily.       . formoterol (FORADIL AEROLIZER) 12 MCG capsule for inhaler Place 1 capsule (12 mcg total) into inhaler and inhale 2 (two) times daily.  60 capsule  6  . furosemide (LASIX) 40 MG tablet Take 2 pills (80 mg) two times per day  90 tablet  3  . gabapentin (NEURONTIN) 300 MG capsule Take 300 mg by mouth 2 (two) times daily.       . Glucosamine-Chondroitin (OSTEO BI-FLEX REGULAR STRENGTH PO) Take 2 tablets by mouth daily.        . hyaluronate sodium (RADIAPLEXRX) GEL Apply 1 application topically 2 (two) times daily.      Marland Kitchen HYDROcodone-acetaminophen (NORCO) 10-325 MG per tablet Take 1 tablet by mouth every 6 (six) hours as needed for pain.      Marland Kitchen losartan (COZAAR) 25 MG tablet Take 0.5 tablets (12.5 mg total) by mouth 2 (two) times daily.  90 tablet  3  . Multiple Vitamin (MULTIVITAMIN) capsule Take 1 capsule by mouth daily.        . NON FORMULARY Mega Red daily       . omeprazole (PRILOSEC) 20 MG capsule Take 20 mg by mouth 2 (two) times daily.       Marland Kitchen oxybutynin (DITROPAN) 5 MG tablet Take 5 mg by mouth daily.        . potassium chloride SA (K-DUR,KLOR-CON) 20 MEQ tablet Take 2 tabs (40 mEq) two times per day  90 tablet  3  . pravastatin (PRAVACHOL) 40 MG tablet Take 40 mg by mouth daily.        . Psyllium (VEGETABLE LAXATIVE PO) as needed.        . triamcinolone cream (KENALOG) 0.1 % As needed      . Vitamin D, Ergocalciferol, (DRISDOL) 50000 UNITS CAPS Take 50,000 Units by mouth every 7 (seven) days.        . sucralfate (CARAFATE) 1 G tablet Take 1 tablet (1 g total) by mouth 4 (four) times daily. Dissolve in 15 cc water.  120 tablet  1   No current facility-administered medications for this encounter.     ALLERGIES: Bacitracin; Imuran; Metoclopramide hcl; Nabumetone; Neomycin-bacitracin zn-polymyx; Nitroglycerin; Nsaids; Nutritional supplements; and Plaquenil   LABORATORY DATA:  Lab Results  Component Value Date   WBC 10.1 02/18/2011   HGB 13.2 02/18/2011   HCT 39.2 02/18/2011   MCV 96.4 02/18/2011   PLT 292.0 02/18/2011   Lab Results  Component Value Date   NA 138 08/16/2012   K 4.5 08/16/2012   CL 99 08/16/2012   CO2 29 08/16/2012   No results found for this basename: ALT, AST, GGT, ALKPHOS, BILITOT     NARRATIVE: Heather Bullock was seen today for weekly treatment management. The chart was checked and the patient's films were reviewed. The patient states that she is experiencing some increasing heartburn consistent with esophagitis. She does have this at baseline but this has been more noticeable recently. Continued fatigue.  PHYSICAL EXAMINATION: height is 5\' 2"  (1.575 m) and weight is 171 lb (77.565 kg). Her temperature is 98.2 F (36.8 C). Her blood pressure is 129/51 and her pulse is 62. Her oxygen saturation is 97%.        ASSESSMENT: The patient is doing satisfactorily with treatment.  PLAN: We will continue with the patient's radiation treatment as planned. I have given her a prescription for Carafate. I'm pleased  that she has been able to come for treatment so far. We will try to keep going.

## 2012-09-26 LAB — AFB CULTURE WITH SMEAR (NOT AT ARMC)
Acid Fast Smear: NONE SEEN
Special Requests: NORMAL

## 2012-09-27 ENCOUNTER — Encounter: Payer: Self-pay | Admitting: Internal Medicine

## 2012-09-27 ENCOUNTER — Ambulatory Visit
Admission: RE | Admit: 2012-09-27 | Discharge: 2012-09-27 | Disposition: A | Discharge status: Home / Self Care | Payer: Medicare Other | Source: Ambulatory Visit | Attending: Radiation Oncology | Admitting: Radiation Oncology

## 2012-09-27 ENCOUNTER — Ambulatory Visit: Payer: Medicare Other

## 2012-09-28 ENCOUNTER — Ambulatory Visit
Admission: RE | Admit: 2012-09-28 | Discharge: 2012-09-28 | Disposition: A | Discharge status: Home / Self Care | Payer: Medicare Other | Source: Ambulatory Visit | Attending: Radiation Oncology | Admitting: Radiation Oncology

## 2012-09-28 ENCOUNTER — Ambulatory Visit: Payer: Medicare Other

## 2012-09-29 ENCOUNTER — Ambulatory Visit
Admission: RE | Admit: 2012-09-29 | Discharge: 2012-09-29 | Disposition: A | Discharge status: Home / Self Care | Payer: Medicare Other | Source: Ambulatory Visit | Attending: Radiation Oncology | Admitting: Radiation Oncology

## 2012-09-29 ENCOUNTER — Ambulatory Visit: Payer: Medicare Other

## 2012-09-30 ENCOUNTER — Ambulatory Visit
Admission: RE | Admit: 2012-09-30 | Discharge: 2012-09-30 | Disposition: A | Discharge status: Home / Self Care | Payer: Medicare Other | Source: Ambulatory Visit | Attending: Radiation Oncology | Admitting: Radiation Oncology

## 2012-09-30 ENCOUNTER — Ambulatory Visit: Payer: Medicare Other

## 2012-09-30 ENCOUNTER — Encounter: Payer: Self-pay | Admitting: Cardiology

## 2012-09-30 ENCOUNTER — Ambulatory Visit (INDEPENDENT_AMBULATORY_CARE_PROVIDER_SITE_OTHER): Payer: Medicare Other | Admitting: Cardiology

## 2012-09-30 ENCOUNTER — Encounter: Payer: Self-pay | Admitting: Internal Medicine

## 2012-09-30 VITALS — Ht 62.0 in | Wt 169.0 lb

## 2012-09-30 DIAGNOSIS — I5022 Chronic systolic (congestive) heart failure: Secondary | ICD-10-CM

## 2012-09-30 DIAGNOSIS — I428 Other cardiomyopathies: Secondary | ICD-10-CM

## 2012-09-30 DIAGNOSIS — Z95810 Presence of automatic (implantable) cardiac defibrillator: Secondary | ICD-10-CM

## 2012-09-30 LAB — ICD DEVICE OBSERVATION
AL AMPLITUDE: 1.5 mv
AL IMPEDENCE ICD: 540 Ohm
AL THRESHOLD: 0.5 V
ATRIAL PACING ICD: 51 pct
BAMS-0001: 150 {beats}/min
FVT: 0
PACEART TECH NOTES ICD: 1:1 {titer}
RV LEAD IMPEDENCE ICD: 550 Ohm
RV LEAD THRESHOLD: 0.5 V
TZAT-0001SLOWVT: 1
TZAT-0004FASTVT: 8
TZAT-0013SLOWVT: 3
TZAT-0018FASTVT: NEGATIVE
TZAT-0020SLOWVT: 1 ms
TZON-0003FASTVT: 280 ms
TZON-0005SLOWVT: 6
TZON-0010FASTVT: 80 ms
TZON-0010SLOWVT: 80 ms
TZST-0001FASTVT: 2
TZST-0001FASTVT: 3
TZST-0001FASTVT: 5
TZST-0001SLOWVT: 2
TZST-0001SLOWVT: 4
TZST-0003FASTVT: 36 J
TZST-0003FASTVT: 40 J
TZST-0003FASTVT: 40 J
TZST-0003SLOWVT: 40 J
TZST-0003SLOWVT: 40 J
VF: 0

## 2012-09-30 NOTE — Patient Instructions (Signed)
Your physician recommends that you schedule a follow-up appointment in: 3 months with Dr. Graciela Husbands on 12/28/2012 @ 11:00 am.  Your physician recommends that you continue on your current medications as directed. Please refer to the Current Medication list given to you today.

## 2012-09-30 NOTE — Progress Notes (Signed)
Department of Radiation Oncology  Phone:  [phone] Fax:        [phone]  Weekly Treatment Note    Name: Heather Bullock Date: 09/30/2012 MRN: [id number] DOB: 10-03-1928   Current dose: 27 Gy  Current fraction: 10   MEDICATIONS: Current Outpatient Prescriptions  Medication Sig Dispense Refill  . Acetaminophen (TYLENOL EXTRA STRENGTH PO) Take 650 mg by mouth as needed.      . Ascorbic Acid (VITAMIN C) 500 MG tablet Take 1,000 mg by mouth daily.       Marland Kitchen aspirin 81 MG tablet Take 81 mg by mouth daily.        . Calcium Citrate-Vitamin D (CALCIUM CITRATE + D PO) Take by mouth daily.       . carvedilol (COREG) 3.125 MG tablet Take 1 tablet (3.125 mg total) by mouth 2 (two) times daily with a meal.  180 tablet  6  . diazepam (VALIUM) 5 MG tablet Take 5 mg by mouth every 6 (six) hours as needed.       . diclofenac (FLECTOR) 1.3 % PTCH Place 1 patch onto the skin 2 (two) times daily.      Marland Kitchen docusate sodium (COLACE) 100 MG capsule daily.       . formoterol (FORADIL AEROLIZER) 12 MCG capsule for inhaler Place 1 capsule (12 mcg total) into inhaler and inhale 2 (two) times daily.  60 capsule  6  . furosemide (LASIX) 40 MG tablet Take 2 pills (80 mg) two times per day  90 tablet  3  . gabapentin (NEURONTIN) 300 MG capsule Take 300 mg by mouth 2 (two) times daily.       . Glucosamine-Chondroitin (OSTEO BI-FLEX REGULAR STRENGTH PO) Take 2 tablets by mouth daily.        . hyaluronate sodium (RADIAPLEXRX) GEL Apply 1 application topically 2 (two) times daily.      Marland Kitchen HYDROcodone-acetaminophen (NORCO) 10-325 MG per tablet Take 1 tablet by mouth every 6 (six) hours as needed for pain.      Marland Kitchen losartan (COZAAR) 25 MG tablet Take 0.5 tablets (12.5 mg total) by mouth 2 (two) times daily.  90 tablet  3  . Multiple Vitamin (MULTIVITAMIN) capsule Take 1 capsule by mouth daily.        . NON FORMULARY Mega Red daily       . omeprazole (PRILOSEC) 20 MG capsule Take 20 mg by mouth 2 (two) times daily.        Marland Kitchen oxybutynin (DITROPAN) 5 MG tablet Take 5 mg by mouth daily.        . potassium chloride SA (K-DUR,KLOR-CON) 20 MEQ tablet Take 2 tabs (40 mEq) two times per day  90 tablet  3  . pravastatin (PRAVACHOL) 40 MG tablet Take 40 mg by mouth daily.        . Psyllium (VEGETABLE LAXATIVE PO) as needed.        . triamcinolone cream (KENALOG) 0.1 % As needed      . Vitamin D, Ergocalciferol, (DRISDOL) 50000 UNITS CAPS Take 50,000 Units by mouth every 7 (seven) days.        . sucralfate (CARAFATE) 1 G tablet Take 1 tablet (1 g total) by mouth 4 (four) times daily. Dissolve in 15 cc water.  120 tablet  1   No current facility-administered medications for this encounter.     ALLERGIES: Bacitracin; Imuran; Metoclopramide hcl; Nabumetone; Neomycin-bacitracin zn-polymyx; Nitroglycerin; Nsaids; Nutritional supplements; and Plaquenil   LABORATORY DATA:  Lab Results  Component Value Date   WBC 10.1 02/18/2011   HGB 13.2 02/18/2011   HCT 39.2 02/18/2011   MCV 96.4 02/18/2011   PLT 292.0 02/18/2011   Lab Results  Component Value Date   NA 138 08/16/2012   K 4.5 08/16/2012   CL 99 08/16/2012   CO2 29 08/16/2012   No results found for this basename: ALT, AST, GGT, ALKPHOS, BILITOT     NARRATIVE: Heather Bullock was seen today for weekly treatment management. The chart was checked and the patient's films were reviewed. The patient is doing reasonably well. Continued significant fatigue you to comorbidities. She is using skin cream daily. She is having esophagitis. The prescription for Carafate went to mail order pharmacy, and she is expecting this any day now. She is fine with this.  PHYSICAL EXAMINATION: height is 5\' 2"  (1.575 m) and weight is 169 lb 6.4 oz (76.839 kg). Her temperature is 97.7 F (36.5 C). Her blood pressure is 124/89 and her pulse is 67. Her oxygen saturation is 99%.        ASSESSMENT: The patient is doing satisfactorily with treatment.  PLAN: We will continue with the patient's  radiation treatment as planned.

## 2012-09-30 NOTE — Progress Notes (Signed)
ICD follow-up in device clinic. Normal ICD function. No programming changes made. See PaceArt report.

## 2012-09-30 NOTE — Progress Notes (Signed)
Heather Bullock here for under treat visit with her sister.  She has had 10/26 fractions to her left lung.  She does have generalized pain that she is rating a 5/10.  She also is having pain with swallowing and especially has a hard time swallowing bread.  She has not received her Carafate yet.  She denies an increase in fatigue.  She states that her skin is intact.  She is using the radiaplex gel twice a day.

## 2012-10-01 ENCOUNTER — Ambulatory Visit
Admission: RE | Admit: 2012-10-01 | Discharge: 2012-10-01 | Disposition: A | Discharge status: Home / Self Care | Payer: Medicare Other | Source: Ambulatory Visit | Attending: Radiation Oncology | Admitting: Radiation Oncology

## 2012-10-01 ENCOUNTER — Ambulatory Visit: Payer: Medicare Other

## 2012-10-04 ENCOUNTER — Telehealth: Payer: Self-pay | Admitting: Oncology

## 2012-10-04 ENCOUNTER — Encounter: Payer: Self-pay | Admitting: Radiation Oncology

## 2012-10-04 ENCOUNTER — Ambulatory Visit
Admission: RE | Admit: 2012-10-04 | Discharge: 2012-10-04 | Disposition: A | Discharge status: Home / Self Care | Payer: Medicare Other | Source: Ambulatory Visit | Attending: Radiation Oncology | Admitting: Radiation Oncology

## 2012-10-04 ENCOUNTER — Ambulatory Visit: Payer: Medicare Other

## 2012-10-04 MED ORDER — MAGIC MOUTHWASH W/LIDOCAINE: ORAL | Status: DC

## 2012-10-04 MED ORDER — HYDROCODONE-ACETAMINOPHEN 10-325 MG PO TABS: 1.0000 | ORAL_TABLET | ORAL | Status: DC | PRN

## 2012-10-04 NOTE — Progress Notes (Addendum)
patient brought around after put per Gareth Eagle telephone call today from patient,  lef t lung 12/26 txs completed, pain states a pain 5 on 1-10 scale all over, , mostly esophagus and stomach this past weekend, carafate made it hurt worse, very weak, and fatigued, "I've never had it like this"  stated patient took ortho vitals,  Sitting b/p=129/67,67,22,97.7, 96% room air sats,  Standing b/p=118/65,68 Had pain med today which helped, tried to eat hard boiled egg this am, each bitwe washed down with water, dry toast, lunch couldn't eat anything, 1 swallow of pomegranite/pineapple juice and that burned going down esophagus and stomach, "It feels raw"draw probiotic keefer thick liquid drank 1/4 cup and that went down and was soothing,  Has lost 4 lbs since last visit, has dry non productive cough, sugessted carnation instant breakfast milkshakes to see if she can tolerate this, will refer to nutritionist   Hasn't met with, lives alone, no energy also c/o cramps in arms now muscle cramps  Buys lean cuisine dinners  2:45 PM

## 2012-10-04 NOTE — Progress Notes (Signed)
   Weekly Management Note:  outpatient Current Dose:  32.4 Gy  Projected Dose: 70.2 Gy   Narrative:  The patient presents for routine under treatment assessment.  CBCT/MVCT images/Port film x-rays were reviewed.  The chart was checked. Lower esophageal and stomach pain are 5/10 and can't tolerate carafate.  On omeprazole BID.  Trouble with PO intake due to pain. Norco helps, taking only 1-2 x /day.  Physical Findings:  weight is 165 lb 9.6 oz (75.116 kg). Her oral temperature is 97.7 F (36.5 C). Her blood pressure is 118/65 and her pulse is 68. Her respiration is 22 and oxygen saturation is 96%.  NAD, in wheelchair  Impression:  The patient is tolerating radiotherapy with esophagitis.  Plan:  Continue radiotherapy as planned.  Refill for [facility] given, told pt to take up to q4hrs.  MMW Rx given as well. Pt will let us know if no improvement. Refer to Nutrition.  INSTRUCTIONS:  1) take Hydrocodone/Acetaminophen 10mg /325mg  tab up to every 4 hours for pain. 2) swallow 10mL of the mouthwash up to four times daily (best 30 min before meals) to numb up esophagus 3) eat soft foods like eggs, grits, oatmeal, puddings, applesauce, ground beef and gravy, mashed veggies, nutritional shakes (boost, ensure, etc), carnation instant bfast and smoothies. Ice cream! 4) try to drink as much as you can to avoid dehydration.   ________________________________   Lonie Peak, M.D.

## 2012-10-04 NOTE — Patient Instructions (Addendum)
1) take Hydrocodone/Acetaminophen 10mg /325mg  tab up to every 4 hours for pain. 2) swallow 10mL of the mouthwash up to four times daily (best 30 min before meals) to numb up esophagus 3) eat soft foods like eggs, grits, oatmeal, puddings, applesauce, ground beef and gravy, mashed veggies, nutritional shakes (boost, ensure, etc), carnation instant bfast and smoothies. Ice cream! 4) try to drink as much as you can to avoid dehydration.

## 2012-10-04 NOTE — Telephone Encounter (Signed)
Received a call from Georgiana Shore stating that she is having trouble swallowing.  She tried using carafate this weekend and said it made her swallowing worse.  Almost like it thickened her secretions.  She stopped taking it and was able to swallow small amounts of food with lots of water on Sunday.  She is very weak.  Advised patient to come to the clinic after treatment today to be seen.

## 2012-10-04 NOTE — Telephone Encounter (Signed)
Called in [employer] script to [place] at [facility] on [address] per Dr. Basilio Cairo.  The script was for 1 part nystatin, 1 part maaloxplus, 1 part benadryl, 3 parts 2% viscous lidocaine.  Swallow 10 ml up to QID, 30 min before meals/bedtime.  Qty: 480 mls. 5 refills.

## 2012-10-05 ENCOUNTER — Ambulatory Visit
Admission: RE | Admit: 2012-10-05 | Discharge: 2012-10-05 | Disposition: A | Discharge status: Home / Self Care | Payer: Medicare Other | Source: Ambulatory Visit | Attending: Radiation Oncology | Admitting: Radiation Oncology

## 2012-10-05 ENCOUNTER — Telehealth: Payer: Self-pay | Admitting: *Deleted

## 2012-10-05 ENCOUNTER — Ambulatory Visit: Payer: Medicare Other

## 2012-10-05 NOTE — Telephone Encounter (Signed)
CALLED PATIENT TO INFORM OF NUTRITION APPT. FOR 10-13-12 AT 2:30 PM WITH BARBARA NEFF, SPOKE WITH PATIENT AND SHE IS AWARE OF THIS APPT.

## 2012-10-06 ENCOUNTER — Ambulatory Visit: Payer: Medicare Other

## 2012-10-06 ENCOUNTER — Ambulatory Visit
Admission: RE | Admit: 2012-10-06 | Discharge: 2012-10-06 | Disposition: A | Discharge status: Home / Self Care | Payer: Medicare Other | Source: Ambulatory Visit | Attending: Radiation Oncology | Admitting: Radiation Oncology

## 2012-10-07 ENCOUNTER — Ambulatory Visit
Admission: RE | Admit: 2012-10-07 | Discharge: 2012-10-07 | Disposition: A | Discharge status: Home / Self Care | Payer: Medicare Other | Source: Ambulatory Visit | Attending: Radiation Oncology | Admitting: Radiation Oncology

## 2012-10-07 ENCOUNTER — Ambulatory Visit: Payer: Medicare Other

## 2012-10-08 ENCOUNTER — Ambulatory Visit
Admission: RE | Admit: 2012-10-08 | Discharge: 2012-10-08 | Disposition: A | Discharge status: Home / Self Care | Payer: Medicare Other | Source: Ambulatory Visit | Attending: Radiation Oncology | Admitting: Radiation Oncology

## 2012-10-08 ENCOUNTER — Ambulatory Visit: Payer: Medicare Other

## 2012-10-08 ENCOUNTER — Encounter: Payer: Self-pay | Admitting: Radiation Oncology

## 2012-10-08 MED ORDER — NYSTATIN 100000 UNIT/ML MT SUSP: 500000.0000 [IU] | Freq: Four times a day (QID) | OROMUCOSAL | Status: DC

## 2012-10-08 MED ORDER — POLYETHYLENE GLYCOL 3350 17 GM/SCOOP PO POWD: 17.0000 g | Freq: Every day | ORAL | Status: DC

## 2012-10-08 NOTE — Progress Notes (Signed)
  Radiation Oncology         [phone] ________________________________  Name: Heather Bullock  MRN: [id number]  Date: 10/08/2012  DOB: 1928-05-15  Weekly Radiation Therapy Management  Current Dose: 43.2 Gy     Planned Dose:  70.2 Gy  Narrative . . . . . . . . The patient presents for routine under treatment assessment.                                                  She still has esophageal pain with or without food.  Otherwise the patient is without complaint.                                 Set-up films were reviewed.                                 The chart was checked. Physical Findings. . . . Weight essentially stable.  No significant changes. Impression . . . . . . . The patient is  tolerating radiation. Plan . . . . . . . . . . . . Continue treatment as planned.  Start Diflucan once daily  ________________________________  Artist Pais. Kathrynn Running, M.D.

## 2012-10-08 NOTE — Addendum Note (Signed)
Encounter addended by: Oneita Hurt, MD on: 10/08/2012  2:47 PM<BR>     Documentation filed: Orders

## 2012-10-08 NOTE — Progress Notes (Signed)
Weekly rad txs left lung, 16 completed,, oxygen room air =95%, slight erythema left side near rib area , uses radiaplex gel, MMW with lidocaine 4x day is helping her esophagus, still hard to swallow, appetite poor, coup blended  1/2 cup today, and some oatmeal, this am, has tried boost but its too sweet,  States patient, will try carnation instant breakfast instead,  Weak and fatigued, using biotene toothpaste and mouthwash, tongue is coated bluish tinted, she thinks it the lidocaine liquid took pain meds last taken at 1200 noon today, pain med making her constipated, takes vegetable laxative ,takes stool softner also,  hasn't had bm in 3 days, sugessted prune juice and miralax, cannot drink much liquids 2:29 PM

## 2012-10-11 ENCOUNTER — Ambulatory Visit
Admission: RE | Admit: 2012-10-11 | Discharge: 2012-10-11 | Disposition: A | Discharge status: Home / Self Care | Payer: Medicare Other | Source: Ambulatory Visit | Attending: Radiation Oncology | Admitting: Radiation Oncology

## 2012-10-12 ENCOUNTER — Telehealth: Payer: Self-pay | Admitting: Cardiology

## 2012-10-12 ENCOUNTER — Ambulatory Visit
Admission: RE | Admit: 2012-10-12 | Discharge: 2012-10-12 | Disposition: A | Discharge status: Home / Self Care | Payer: Medicare Other | Source: Ambulatory Visit | Attending: Radiation Oncology | Admitting: Radiation Oncology

## 2012-10-12 MED ORDER — POTASSIUM CHLORIDE 40 MEQ/15ML (20%) PO LIQD: 40.0000 meq | Freq: Two times a day (BID) | ORAL | Status: DC

## 2012-10-12 NOTE — Telephone Encounter (Signed)
Pt having a lot of trouble swallowing now that she is having radiation treatments.  Klor-Con will be changed to a liquid form.  She will call back if she continue to have problems.

## 2012-10-12 NOTE — Telephone Encounter (Signed)
New Problem  Pt states she is having difficult swallowing her medication since she has started radiation. She wants to know what can she do.

## 2012-10-13 ENCOUNTER — Ambulatory Visit
Admission: RE | Admit: 2012-10-13 | Discharge: 2012-10-13 | Disposition: A | Discharge status: Home / Self Care | Payer: Medicare Other | Source: Ambulatory Visit | Attending: Radiation Oncology | Admitting: Radiation Oncology

## 2012-10-13 ENCOUNTER — Ambulatory Visit: Payer: Medicare Other | Admitting: Nutrition

## 2012-10-13 ENCOUNTER — Telehealth: Payer: Self-pay | Admitting: Cardiology

## 2012-10-13 MED ORDER — POTASSIUM CHLORIDE ER 10 MEQ PO CPCR: 40.0000 meq | ORAL_CAPSULE | Freq: Two times a day (BID) | ORAL | Status: DC

## 2012-10-13 NOTE — Telephone Encounter (Signed)
Left message for pt to call back  °

## 2012-10-13 NOTE — Progress Notes (Signed)
Patient is an 79 year old female patient diagnosed with lung cancer. She is a patient of Dr. Basilio Cairo.  Past medical history includes cardiomyopathy, hypertension, diverticulosis, chronic fatigue, chronic renal insufficiency, and osteopenia.  Medications include Magic mouthwash, vitamin C, Colace, Lasix, multivitamin, Prilosec, K-Dur, and Carafate.  Labs were reviewed.  Height: 62 inches. Weight: 165.6 pounds. Usual body weight: 170 pounds. BMI: 30.28.  Patient expresses concern about difficulty swallowing. She finds it nearly impossible to swallow most pills. She can swallow some liquids but food is very difficult. She will drink milk and Carnation breakfast and a small amount of vanilla boost but it is a struggle. Patient reports she is too weak to prepare foods and blenderize them. She is not interested in trying baby foods because she feels like they would not taste good. She does have support from some family and friends.  Nutrition diagnosis: Swallowing difficulty related to radiation treatment for lung cancer as evidenced by decreased estimated food intake and 5 pound weight loss.  Intervention: I have educated patient that she must increase consumption of oral nutrition supplements or begin blenderizing regular foods or consuming baby foods. I've encouraged her to work with her physician to be sure she has proper medications for swallowing pain and difficulty. I have provided recipes for milkshakes and smoothies. I've also provided patient with fact sheets. Questions were answered. Teach back method used. Oral nutrition supplement samples provided.  Monitoring, evaluation, goals: Patient will tolerate increased calories and protein to minimize weight loss throughout treatment and promote healing.  Next visit: Patient will contact me if she has questions or concerns.

## 2012-10-13 NOTE — Telephone Encounter (Signed)
New Problem  Pt states that the insurance company will not cover the prescription for potassium meds that was called in yesterday. She wants to know if we can talk with the insurance company or what she can do.

## 2012-10-13 NOTE — Telephone Encounter (Signed)
Called pharmacy and was given a phone number to call for prior authorization [phone]).  After calling that number was instructed to call [phone] about prior authorization.  Ultimately the medication formulation in liquid form was denied as they will only cover the tablets or capsules.  I will explain this to the patient and see if she wants to have capsule form.  She would be able to open the capsules and sprinkle the contents on her food.

## 2012-10-13 NOTE — Telephone Encounter (Signed)
Follow up  Pt returned your call. She said go ahead and fill the prescription for the capsules to the [facility].

## 2012-10-14 ENCOUNTER — Ambulatory Visit
Admission: RE | Admit: 2012-10-14 | Discharge: 2012-10-14 | Disposition: A | Discharge status: Home / Self Care | Payer: Medicare Other | Source: Ambulatory Visit | Attending: Radiation Oncology | Admitting: Radiation Oncology

## 2012-10-15 ENCOUNTER — Telehealth: Payer: Self-pay | Admitting: Oncology

## 2012-10-15 ENCOUNTER — Ambulatory Visit: Payer: Medicare Other

## 2012-10-15 NOTE — Telephone Encounter (Addendum)
Called Heather Bullock to verify her request to refill her magic mouthwash.  She wishes to have it called in to [facility] on [address].  She states the bottle says no refills.   I had previously called in the script to [facility] per Dr. Basilio Cairo on 10/04/2012 with 5 refills.  Called the refill in to [facility] on [address] again: [employer] w/Lidocaine solution: 1 part nystatin, 1 part Maalox plus, 1 part benadryl, 3 part 2% viscous lidocaine.  Swallow 10 ml up to QID.  30 min before meals/bedtime.  Dispense 480 ml.  4 refills.

## 2012-10-16 ENCOUNTER — Other Ambulatory Visit: Payer: Self-pay | Admitting: Physician Assistant

## 2012-10-16 ENCOUNTER — Telehealth: Payer: Self-pay | Admitting: Physician Assistant

## 2012-10-16 MED ORDER — FUROSEMIDE 40 MG PO TABS: 80.0000 mg | ORAL_TABLET | Freq: Two times a day (BID) | ORAL | Status: DC

## 2012-10-16 NOTE — Progress Notes (Signed)
See phone note

## 2012-10-16 NOTE — Telephone Encounter (Signed)
She has been undergoing radiation therapy and has felt bad with weakness and DOE. She has run out of her Lasix and wanted it refilled. 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 She did not feel she needed to be seen acutely, she is tracking her weight and it is going down. She is not swelling.   Sent in a 10-day Rx for Lasix 40 mg, 2 tabs BID to local [facility]

## 2012-10-18 ENCOUNTER — Ambulatory Visit: Payer: Medicare Other | Admitting: Cardiology

## 2012-10-18 ENCOUNTER — Telehealth: Payer: Self-pay | Admitting: Cardiology

## 2012-10-18 ENCOUNTER — Ambulatory Visit: Payer: Medicare Other

## 2012-10-18 ENCOUNTER — Ambulatory Visit: Admission: RE | Admit: 2012-10-18 | Payer: Medicare Other | Source: Ambulatory Visit

## 2012-10-18 NOTE — Telephone Encounter (Signed)
Person not accepting calls right now.

## 2012-10-18 NOTE — Telephone Encounter (Signed)
Pt needs fu call from trouble over the weekend per rhonda pa after hours voicemail

## 2012-10-19 ENCOUNTER — Telehealth: Payer: Self-pay | Admitting: *Deleted

## 2012-10-19 ENCOUNTER — Telehealth: Payer: Self-pay | Admitting: Oncology

## 2012-10-19 ENCOUNTER — Ambulatory Visit: Payer: Medicare Other

## 2012-10-19 NOTE — Telephone Encounter (Signed)
Called Heather Bullock due to her canceling her appointment today.  She states that she is not feeling well and has been running a temperature for 3 days of 100 degrees.  She states it is 99.6 right now.  She also had a cough over the weekend that is gone now.  She is not nauseated or having diarrhea.  She is having pain with swallowing.  She is using her magic mouthwash and did take a hydrocodone/acetaminophen this morning.  She states that they really do not help with the pain.  She is also very weak.  Notified Dr. Mitzi Hansen and he would like her to come in for treatment tomorrow and to be assessed.  Called [name] back to advise her of this.  She stated that she is very weak and was not sure she would make it.  She stated that she wants to talk to Dr. Mitzi Hansen about whether she should finish treatment if she is feeling this badly from treatment.  Advised [name] to try to come in tomorrow and if she is having trouble breathing to go to the ER.  [name] stated that she would try to make it for her appointment tomorrow.

## 2012-10-19 NOTE — Telephone Encounter (Signed)
Pt's  Friend Cetta  Called regarding pt. Having a low BP this Am of 89/52 now is 104/50. Pt has been lying in bed because of radiation treatment for Lung CA. Pt is weak when sitting in bed she feels light headed. Pt has lost some weight and her appetite is poor.

## 2012-10-19 NOTE — Telephone Encounter (Signed)
Dr. Ladona Ridgel DOD recommends to hold her lasix 40 mg 2 tablets (80 mg) PM dose, and to restart tomorrow with the AM dose. Pt has an appointment with the radiation MD tomorrow ,and she is so weak that she does not know if she is going to make the appointment. Pt aware of MD recommendations and to call back if needed.

## 2012-10-20 ENCOUNTER — Ambulatory Visit
Admission: RE | Admit: 2012-10-20 | Discharge: 2012-10-20 | Disposition: A | Discharge status: Home / Self Care | Payer: Medicare Other | Source: Ambulatory Visit | Attending: Radiation Oncology | Admitting: Radiation Oncology

## 2012-10-20 ENCOUNTER — Ambulatory Visit (HOSPITAL_BASED_OUTPATIENT_CLINIC_OR_DEPARTMENT_OTHER)

## 2012-10-20 ENCOUNTER — Ambulatory Visit: Admission: RE | Admit: 2012-10-20 | Payer: Medicare Other | Source: Ambulatory Visit | Admitting: Radiation Oncology

## 2012-10-20 ENCOUNTER — Other Ambulatory Visit: Payer: Self-pay | Admitting: *Deleted

## 2012-10-20 ENCOUNTER — Other Ambulatory Visit: Payer: Self-pay | Admitting: Radiation Oncology

## 2012-10-20 DIAGNOSIS — C3430 Malignant neoplasm of lower lobe, unspecified bronchus or lung: Secondary | ICD-10-CM

## 2012-10-20 DIAGNOSIS — E860 Dehydration: Secondary | ICD-10-CM

## 2012-10-20 LAB — COMPREHENSIVE METABOLIC PANEL (CC13)
Albumin: 3.5 g/dL (ref 3.5–5.0)
BUN: 20.3 mg/dL (ref 7.0–26.0)
Calcium: 9.6 mg/dL (ref 8.4–10.4)
Chloride: 99 mEq/L (ref 98–107)
Glucose: 106 mg/dl — ABNORMAL HIGH (ref 70–99)
Potassium: 4.1 mEq/L (ref 3.5–5.1)

## 2012-10-20 LAB — CBC WITH DIFFERENTIAL/PLATELET
Basophils Absolute: 0 10*3/uL (ref 0.0–0.1)
Eosinophils Absolute: 0.2 10*3/uL (ref 0.0–0.5)
HCT: 40.3 % (ref 34.8–46.6)
HGB: 13 g/dL (ref 11.6–15.9)
MCV: 93.5 fL (ref 79.5–101.0)
MONO%: 8.5 % (ref 0.0–14.0)
NEUT#: 3.9 10*3/uL (ref 1.5–6.5)
RDW: 14.7 % — ABNORMAL HIGH (ref 11.2–14.5)
lymph#: 0.6 10*3/uL — ABNORMAL LOW (ref 0.9–3.3)

## 2012-10-20 MED ORDER — SODIUM CHLORIDE 0.9 % IV SOLN
Freq: Once | INTRAVENOUS | Status: DC
  Administered 2012-10-20: 15:00:00 via INTRAVENOUS

## 2012-10-20 MED ORDER — SODIUM CHLORIDE 0.9 % IV SOLN
500.0000 mL | Freq: Once | INTRAVENOUS | Status: DC
  Filled 2012-10-20: qty 500

## 2012-10-20 NOTE — Progress Notes (Signed)
Weekly Management Note Current Dose: 54.0 Gy  Projected Dose:70.2 Gy   Narrative:  The patient presents for routine under treatment assessment.  CBCT/MVCT images/Port film x-rays were reviewed.  The chart was checked. Feeling "like I want to die." Had a milkshake and boost today. Not able to swallow due to pain. Only taking hydrocodone when she needs it (once or twice a day). Took lasix this morning due to feet swelling. Has been in contact with PCP re: low blood pressure.  No bowel movement for 4-5 days.   Physical Findings:  Unchanged  Vitals: There were no vitals filed for this visit. Weight:  Wt Readings from Last 3 Encounters:  10/20/12 159 lb 8 oz (72.349 kg)  10/04/12 165 lb 9.6 oz (75.116 kg)  09/30/12 169 lb 6.4 oz (76.839 kg)   Lab Results  Component Value Date   WBC 10.1 02/18/2011   HGB 13.2 02/18/2011   HCT 39.2 02/18/2011   MCV 96.4 02/18/2011   PLT 292.0 02/18/2011   Lab Results  Component Value Date   CREATININE 1.3* 08/16/2012   BUN 25* 08/16/2012   NA 138 08/16/2012   K 4.5 08/16/2012   CL 99 08/16/2012   CO2 29 08/16/2012     Impression:  The patient is tolerating radiation.  Plan:  Continue treatment as planned. 500 NS IVF. Miralax for constipation. Continue treatment. Social work for support.

## 2012-10-20 NOTE — Progress Notes (Signed)
Heather Bullock here for increasing weakness since last Thursday.  She states that she ran a fever over 100.6 Saturday, Sunday and Monday.  She also had a nonproductive cough.  She is very weak.  She is in a wheelchair today and uses a walker at home.  She states that she has been mostly in bed since last Saturday.  She feels like she "has hit rock bottom."  She does not know if it is worth finishing treatment.  She does have nausea today.  She has lost 6 lbs since 10/04/2012.  She has seen the dietician.  She has been drinking 1 ensure per day.  She has not been eating anything except for a milkshake that her son brought her yesterday.  She has pain when swallowing in her esophagus.  She does have hydrocone/acetaminophen that she has been taking and also carafate and magic mouthwash.  She states that they do not help.  She has missed treatment since Thusday due to the machine being down and not feeling well.

## 2012-10-20 NOTE — Telephone Encounter (Signed)
Please see other telephone note

## 2012-10-20 NOTE — Patient Instructions (Addendum)
Dehydration, Adult Dehydration is when you lose more fluids from the body than you take in. Vital organs like the kidneys, brain, and heart cannot function without a proper amount of fluids and salt. Any loss of fluids from the body can cause dehydration.  CAUSES   Vomiting.  Diarrhea.  Excessive sweating.  Excessive urine output.  Fever. SYMPTOMS  Mild dehydration  Thirst.  Dry lips.  Slightly dry mouth. Moderate dehydration  Very dry mouth.  Sunken eyes.  Skin does not bounce back quickly when lightly pinched and released.  Dark urine and decreased urine production.  Decreased tear production.  Headache. Severe dehydration  Very dry mouth.  Extreme thirst.  Rapid, weak pulse (more than 100 beats per minute at rest).  Cold hands and feet.  Not able to sweat in spite of heat and temperature.  Rapid breathing.  Blue lips.  Confusion and lethargy.  Difficulty being awakened.  Minimal urine production.  No tears. DIAGNOSIS  Your caregiver will diagnose dehydration based on your symptoms and your exam. Blood and urine tests will help confirm the diagnosis. The diagnostic evaluation should also identify the cause of dehydration. TREATMENT  Treatment of mild or moderate dehydration can often be done at home by increasing the amount of fluids that you drink. It is best to drink small amounts of fluid more often. Drinking too much at one time can make vomiting worse. Refer to the home care instructions below. Severe dehydration needs to be treated at the hospital where you will probably be given intravenous (IV) fluids that contain water and electrolytes. HOME CARE INSTRUCTIONS   Ask your caregiver about specific rehydration instructions.  Drink enough fluids to keep your urine clear or pale yellow.  Drink small amounts frequently if you have nausea and vomiting.  Eat as you normally do.  Avoid:  Foods or drinks high in sugar.  Carbonated  drinks.  Juice.  Extremely hot or cold fluids.  Drinks with caffeine.  Fatty, greasy foods.  Alcohol.  Tobacco.  Overeating.  Gelatin desserts.  Wash your hands well to avoid spreading bacteria and viruses.  Only take over-the-counter or prescription medicines for pain, discomfort, or fever as directed by your caregiver.  Ask your caregiver if you should continue all prescribed and over-the-counter medicines.  Keep all follow-up appointments with your caregiver. SEEK MEDICAL CARE IF:  You have abdominal pain and it increases or stays in one area (localizes).  You have a rash, stiff neck, or severe headache.  You are irritable, sleepy, or difficult to awaken.  You are weak, dizzy, or extremely thirsty. SEEK IMMEDIATE MEDICAL CARE IF:   You are unable to keep fluids down or you get worse despite treatment.  You have frequent episodes of vomiting or diarrhea.  You have blood or green matter (bile) in your vomit.  You have blood in your stool or your stool looks black and tarry.  You have not urinated in 6 to 8 hours, or you have only urinated a small amount of very dark urine.  You have a fever.  You faint. MAKE SURE YOU:   Understand these instructions.  Will watch your condition.  Will get help right away if you are not doing well or get worse. Document Released: 04/14/2005 Document Revised: 07/07/2011 Document Reviewed: 12/02/2010 Exit[employer] Patient Information 2014 [employer].  

## 2012-10-21 ENCOUNTER — Ambulatory Visit
Admission: RE | Admit: 2012-10-21 | Discharge: 2012-10-21 | Disposition: A | Discharge status: Home / Self Care | Payer: Medicare Other | Source: Ambulatory Visit | Attending: Radiation Oncology | Admitting: Radiation Oncology

## 2012-10-21 NOTE — Progress Notes (Signed)
Department of Radiation Oncology  Phone:  [phone] Fax:        [phone]  Weekly Treatment Note    Name: Heather Bullock Date: 10/21/2012 MRN: [id number] DOB: 1929/01/27   Current dose: 59.4 Gy  Current fraction: 22   MEDICATIONS: Current Outpatient Prescriptions  Medication Sig Dispense Refill  . Acetaminophen (TYLENOL EXTRA STRENGTH PO) Take 650 mg by mouth as needed.      . Alum & Mag Hydroxide-Simeth (MAGIC MOUTHWASH W/LIDOCAINE) SOLN 1part nystatin,1part Maaloxplus,1part benadryl,3part 2%viscous lidocaine. Swallow 10 mL up to QID, before meals/bedtime.  Called in [place] on [address] for refill on 10/15/2012 with 3 refills.      . Ascorbic Acid (VITAMIN C) 500 MG tablet Take 1,000 mg by mouth daily.       Marland Kitchen aspirin 81 MG tablet Take 81 mg by mouth daily.        . Calcium Citrate-Vitamin D (CALCIUM CITRATE + D PO) Take by mouth daily.       . carvedilol (COREG) 3.125 MG tablet Take 1 tablet (3.125 mg total) by mouth 2 (two) times daily with a meal.  180 tablet  6  . diazepam (VALIUM) 5 MG tablet Take 5 mg by mouth every 6 (six) hours as needed.       . diclofenac (FLECTOR) 1.3 % PTCH Place 1 patch onto the skin 2 (two) times daily.      Marland Kitchen docusate sodium (COLACE) 100 MG capsule daily.       . formoterol (FORADIL AEROLIZER) 12 MCG capsule for inhaler Place 1 capsule (12 mcg total) into inhaler and inhale 2 (two) times daily.  60 capsule  6  . furosemide (LASIX) 40 MG tablet Take 2 pills (80 mg) two times per day  90 tablet  3  . furosemide (LASIX) 40 MG tablet Take 2 tablets (80 mg total) by mouth 2 (two) times daily.  40 tablet  0  . gabapentin (NEURONTIN) 300 MG capsule Take 300 mg by mouth 2 (two) times daily.       . Glucosamine-Chondroitin (OSTEO BI-FLEX REGULAR STRENGTH PO) Take 2 tablets by mouth daily.        . hyaluronate sodium (RADIAPLEXRX) GEL Apply 1 application topically 2 (two) times daily.      Marland Kitchen HYDROcodone-acetaminophen (NORCO) 10-325 MG per tablet  Take 1 tablet by mouth every 4 (four) hours as needed for pain.  45 tablet  0  . KLOR-CON M20 20 MEQ tablet 20 mEq.      Marland Kitchen losartan (COZAAR) 25 MG tablet Take 0.5 tablets (12.5 mg total) by mouth 2 (two) times daily.  90 tablet  3  . Multiple Vitamin (MULTIVITAMIN) capsule Take 1 capsule by mouth daily.        . NON FORMULARY Mega Red daily       . nystatin (MYCOSTATIN) 100000 UNIT/ML suspension Take 5 mLs (500,000 Units total) by mouth 4 (four) times daily.  60 mL  0  . omeprazole (PRILOSEC) 20 MG capsule Take 20 mg by mouth 2 (two) times daily.      Marland Kitchen oxybutynin (DITROPAN) 5 MG tablet Take 5 mg by mouth daily.        . polyethylene glycol powder (MIRALAX) powder Take 17 g by mouth daily.  255 g  5  . potassium chloride (MICRO-K) 10 MEQ CR capsule Take 4 capsules (40 mEq total) by mouth 2 (two) times daily.  240 capsule  11  . pravastatin (PRAVACHOL) 40 MG tablet Take 40  mg by mouth daily.        . Psyllium (VEGETABLE LAXATIVE PO) as needed.        . sucralfate (CARAFATE) 1 G tablet Take 1 tablet (1 g total) by mouth 4 (four) times daily. Dissolve in 15 cc water.  120 tablet  1  . triamcinolone cream (KENALOG) 0.1 % As needed      . Vitamin D, Ergocalciferol, (DRISDOL) 50000 UNITS CAPS Take 50,000 Units by mouth every 7 (seven) days.         No current facility-administered medications for this encounter.     ALLERGIES: Bacitracin; Imuran; Metoclopramide hcl; Nabumetone; Neomycin-bacitracin zn-polymyx; Nitroglycerin; Nsaids; Nutritional supplements; and Plaquenil   LABORATORY DATA:  Lab Results  Component Value Date   WBC 5.2 10/20/2012   HGB 13.0 10/20/2012   HCT 40.3 10/20/2012   MCV 93.5 10/20/2012   PLT 220 10/20/2012   Lab Results  Component Value Date   NA 137 10/20/2012   K 4.1 10/20/2012   CL 99 10/20/2012   CO2 30* 10/20/2012   Lab Results  Component Value Date   ALT 8 10/20/2012   AST 16 10/20/2012   ALKPHOS 48 10/20/2012   BILITOT 0.42 10/20/2012     NARRATIVE: Heather Bullock was seen today for weekly treatment management. The chart was checked and the patient's films were reviewed. The patient continues to complain of significant fatigue. This is a chronic issue but has increased during her treatment. She is feeling better today. She has begun using her pain medicine regularly every 4 hours. This seems to be helping somewhat.  PHYSICAL EXAMINATION: Sitting in a wheelchair, in no acute distress.     ASSESSMENT: The patient is doing satisfactorily with treatment.  PLAN: We will continue with the patient's radiation treatment as planned. The patient is nearing her end of treatment. She was encouraged to continue maintaining her nutrition, especially fluids over the last week.

## 2012-10-22 ENCOUNTER — Ambulatory Visit
Admission: RE | Admit: 2012-10-22 | Discharge: 2012-10-22 | Disposition: A | Discharge status: Home / Self Care | Payer: Medicare Other | Source: Ambulatory Visit | Attending: Radiation Oncology | Admitting: Radiation Oncology

## 2012-10-22 ENCOUNTER — Telehealth: Payer: Self-pay | Admitting: Dietician

## 2012-10-23 ENCOUNTER — Ambulatory Visit
Admission: RE | Admit: 2012-10-23 | Discharge: 2012-10-23 | Disposition: A | Discharge status: Home / Self Care | Payer: Medicare Other | Source: Ambulatory Visit | Attending: Radiation Oncology | Admitting: Radiation Oncology

## 2012-10-25 ENCOUNTER — Telehealth: Payer: Self-pay | Admitting: Internal Medicine

## 2012-10-25 ENCOUNTER — Encounter: Payer: Self-pay | Admitting: Radiation Oncology

## 2012-10-25 ENCOUNTER — Ambulatory Visit
Admission: RE | Admit: 2012-10-25 | Discharge: 2012-10-25 | Disposition: A | Discharge status: Home / Self Care | Payer: Medicare Other | Source: Ambulatory Visit | Attending: Radiation Oncology | Admitting: Radiation Oncology

## 2012-10-25 ENCOUNTER — Ambulatory Visit (HOSPITAL_BASED_OUTPATIENT_CLINIC_OR_DEPARTMENT_OTHER): Payer: Medicare Other

## 2012-10-25 DIAGNOSIS — R112 Nausea with vomiting, unspecified: Secondary | ICD-10-CM

## 2012-10-25 MED ORDER — ONDANSETRON HCL 8 MG PO TABS: 8.0000 mg | ORAL_TABLET | Freq: Three times a day (TID) | ORAL | Status: DC | PRN

## 2012-10-25 MED ORDER — SODIUM CHLORIDE 0.9 % IV SOLN
8.0000 mg | Freq: Once | INTRAVENOUS | Status: AC
  Administered 2012-10-25: 8 mg via INTRAVENOUS
  Filled 2012-10-25: qty 4

## 2012-10-25 MED ORDER — SODIUM CHLORIDE 0.9 % IV SOLN
Freq: Once | INTRAVENOUS | Status: AC
  Administered 2012-10-25: 16:00:00 via INTRAVENOUS

## 2012-10-25 NOTE — Progress Notes (Signed)
weekly rad txs lt lung 24/26 completed, patient  Oxygen sats room air 98% patientvery nauseated and weak, threw up this morning, took suppository this am and had average bowel movement, last eaten solid foods was soup yesterday and last night milshake smoothie, back is erythema and itches just started  A couple days, didn't know to put cream on back, , only using radiaplex on front tof chest chest, took ortho vitals sitting b/p=123/59,pulse=70,rr=20, standing b/p=88/54,pulse =72, patient did get IVf's NS 500cc 10/20/12, unable to take anything today,  2:34 PM

## 2012-10-25 NOTE — Patient Instructions (Addendum)
Dehydration, Adult  Dehydration means your body does not have as much fluid as it needs. Your kidneys, brain, and heart will not work properly without the right amount of fluids and salt.   HOME CARE   Ask your doctor how to replace body fluid losses (rehydrate).   Drink enough fluids to keep your pee (urine) clear or pale yellow.   Drink small amounts of fluids often if you feel sick to your stomach (nauseous) or throw up (vomit).   Eat like you normally do.   Avoid:   Foods or drinks high in sugar.   Bubbly (carbonated) drinks.   Juice.   Very hot or cold fluids.   Drinks with caffeine.   Fatty, greasy foods.   Alcohol.   Tobacco.   Eating too much.   Gelatin desserts.   Wash your hands to avoid spreading germs (bacteria, viruses).   Only take medicine as told by your doctor.   Keep all doctor visits as told.  GET HELP RIGHT AWAY IF:    You cannot drink something without throwing up.   You get worse even with treatment.   Your vomit has blood in it or looks greenish.   Your poop (stool) has blood in it or looks black and tarry.   You have not peed in 6 to 8 hours.   You pee a small amount of very dark pee.   You have a fever.   You pass out (faint).   You have belly (abdominal) pain that gets worse or stays in one spot (localizes).   You have a rash, stiff neck, or bad headache.   You get easily annoyed, sleepy, or are hard to wake up.   You feel weak, dizzy, or very thirsty.  MAKE SURE YOU:    Understand these instructions.   Will watch your condition.   Will get help right away if you are not doing well or get worse.  Document Released: 02/08/2009 Document Revised: 07/07/2011 Document Reviewed: 12/02/2010  Exit[employer] Patient Information 2014 [employer].

## 2012-10-25 NOTE — Telephone Encounter (Signed)
per amy charge nurse add pt for ivf chair F @ 3:30pm

## 2012-10-26 ENCOUNTER — Ambulatory Visit: Payer: Medicare Other

## 2012-10-26 ENCOUNTER — Ambulatory Visit
Admission: RE | Admit: 2012-10-26 | Discharge: 2012-10-26 | Disposition: A | Discharge status: Home / Self Care | Payer: Medicare Other | Source: Ambulatory Visit | Attending: Radiation Oncology | Admitting: Radiation Oncology

## 2012-10-26 NOTE — Progress Notes (Signed)
Department of Radiation Oncology  Phone:  [phone] Fax:        [phone]  Weekly Treatment Note    Name: Heather Bullock Date: 10/26/2012 MRN: [id number] DOB: 17-May-1928   Current dose: 64.8 Gy  Current fraction: 24   MEDICATIONS: Current Outpatient Prescriptions  Medication Sig Dispense Refill  . Acetaminophen (TYLENOL EXTRA STRENGTH PO) Take 650 mg by mouth as needed.      . Alum & Mag Hydroxide-Simeth (MAGIC MOUTHWASH W/LIDOCAINE) SOLN 1part nystatin,1part Maaloxplus,1part benadryl,3part 2%viscous lidocaine. Swallow 10 mL up to QID, before meals/bedtime.  Called in [facility] on [address] for refill on 10/15/2012 with 3 refills.      . Ascorbic Acid (VITAMIN C) 500 MG tablet Take 1,000 mg by mouth daily.       Marland Kitchen aspirin 81 MG tablet Take 81 mg by mouth daily.        . Calcium Citrate-Vitamin D (CALCIUM CITRATE + D PO) Take by mouth daily.       . carvedilol (COREG) 3.125 MG tablet Take 1 tablet (3.125 mg total) by mouth 2 (two) times daily with a meal.  180 tablet  6  . diazepam (VALIUM) 5 MG tablet Take 5 mg by mouth every 6 (six) hours as needed.       . diclofenac (FLECTOR) 1.3 % PTCH Place 1 patch onto the skin 2 (two) times daily.      Marland Kitchen docusate sodium (COLACE) 100 MG capsule daily.       . formoterol (FORADIL AEROLIZER) 12 MCG capsule for inhaler Place 1 capsule (12 mcg total) into inhaler and inhale 2 (two) times daily.  60 capsule  6  . furosemide (LASIX) 40 MG tablet Take 2 pills (80 mg) two times per day  90 tablet  3  . furosemide (LASIX) 40 MG tablet Take 2 tablets (80 mg total) by mouth 2 (two) times daily.  40 tablet  0  . gabapentin (NEURONTIN) 300 MG capsule Take 300 mg by mouth 2 (two) times daily.       . Glucosamine-Chondroitin (OSTEO BI-FLEX REGULAR STRENGTH PO) Take 2 tablets by mouth daily.        . hyaluronate sodium (RADIAPLEXRX) GEL Apply 1 application topically 2 (two) times daily.      Marland Kitchen HYDROcodone-acetaminophen (NORCO) 10-325 MG per tablet  Take 1 tablet by mouth every 4 (four) hours as needed for pain.  45 tablet  0  . KLOR-CON M20 20 MEQ tablet 20 mEq.      Marland Kitchen losartan (COZAAR) 25 MG tablet Take 0.5 tablets (12.5 mg total) by mouth 2 (two) times daily.  90 tablet  3  . Multiple Vitamin (MULTIVITAMIN) capsule Take 1 capsule by mouth daily.        . NON FORMULARY Mega Red daily       . nystatin (MYCOSTATIN) 100000 UNIT/ML suspension Take 5 mLs (500,000 Units total) by mouth 4 (four) times daily.  60 mL  0  . omeprazole (PRILOSEC) 20 MG capsule Take 20 mg by mouth 2 (two) times daily.      Marland Kitchen oxybutynin (DITROPAN) 5 MG tablet Take 5 mg by mouth daily.        . polyethylene glycol powder (MIRALAX) powder Take 17 g by mouth daily.  255 g  5  . potassium chloride (MICRO-K) 10 MEQ CR capsule Take 4 capsules (40 mEq total) by mouth 2 (two) times daily.  240 capsule  11  . pravastatin (PRAVACHOL) 40 MG tablet Take 40  mg by mouth daily.        . Psyllium (VEGETABLE LAXATIVE PO) as needed.        . sucralfate (CARAFATE) 1 G tablet Take 1 tablet (1 g total) by mouth 4 (four) times daily. Dissolve in 15 cc water.  120 tablet  1  . triamcinolone cream (KENALOG) 0.1 % As needed      . Vitamin D, Ergocalciferol, (DRISDOL) 50000 UNITS CAPS Take 50,000 Units by mouth every 7 (seven) days.        . ondansetron (ZOFRAN) 8 MG tablet Take 1 tablet (8 mg total) by mouth every 8 (eight) hours as needed for nausea.  20 tablet  0   No current facility-administered medications for this encounter.     ALLERGIES: Bacitracin; Imuran; Metoclopramide hcl; Nabumetone; Neomycin-bacitracin zn-polymyx; Nitroglycerin; Nsaids; Nutritional supplements; and Plaquenil   LABORATORY DATA:  Lab Results  Component Value Date   WBC 5.2 10/20/2012   HGB 13.0 10/20/2012   HCT 40.3 10/20/2012   MCV 93.5 10/20/2012   PLT 220 10/20/2012   Lab Results  Component Value Date   NA 137 10/20/2012   K 4.1 10/20/2012   CL 99 10/20/2012   CO2 30* 10/20/2012   Lab Results    Component Value Date   ALT 8 10/20/2012   AST 16 10/20/2012   ALKPHOS 48 10/20/2012   BILITOT 0.42 10/20/2012     NARRATIVE: Heather Bullock was seen today for weekly treatment management. The chart was checked and the patient's films were reviewed. The patient is complaining of significant nausea today. She really has not been able to keep anything down today. This has not been a major problem for her and she does not have any nausea medicine currently.  PHYSICAL EXAMINATION: weight is 156 lb 4.8 oz (70.897 kg). Her oral temperature is 98.1 F (36.7 C). Her blood pressure is 88/54 and her pulse is 72. Her respiration is 20 and oxygen saturation is 98%.        ASSESSMENT: The patient is doing satisfactorily with treatment.  PLAN: We will continue with the patient's radiation treatment as planned. The patient will be given IV fluids. She does have some heart issues and we will give 500 cc of normal saline today. She has been given Zofran in the clinic and I have also called in a prescription for Zofran as well.

## 2012-10-27 ENCOUNTER — Encounter: Payer: Self-pay | Admitting: Radiation Oncology

## 2012-10-27 ENCOUNTER — Encounter: Payer: Self-pay | Admitting: Internal Medicine

## 2012-10-27 ENCOUNTER — Ambulatory Visit
Admission: RE | Admit: 2012-10-27 | Discharge: 2012-10-27 | Disposition: A | Discharge status: Home / Self Care | Payer: Medicare Other | Source: Ambulatory Visit | Attending: Radiation Oncology | Admitting: Radiation Oncology

## 2012-10-27 NOTE — Progress Notes (Signed)
Pt brought to nursing after receiving her final radiation treatment. She states she has been taking Zofran for nausea w/good relief. She denies pain stating "I took my pain medicines before I came." She is taking pain medications regularly. Pt states she has not taken Zofran today and is feeling " a bit nauseated". Advised her to take Zofran as often as needed. Pt states she is tired and wants to go home. She wishes to call back and schedule her 1 month FU appt. Gave her card and number for Noreene Larsson, scheduler. Pt in wheelchair accompanied by 2 friends who assisted her out.

## 2012-11-01 ENCOUNTER — Ambulatory Visit: Payer: Medicare Other | Admitting: Cardiology

## 2012-11-01 ENCOUNTER — Telehealth: Payer: Self-pay | Admitting: Cardiology

## 2012-11-01 NOTE — Telephone Encounter (Signed)
New Problem:    Patient called in stating that she really needed to speak with you prior to her appointment this afternoon.  Please call back.

## 2012-11-01 NOTE — Telephone Encounter (Signed)
I would ask her to check with the radiation MDs to see if there is anything that they use specifically for this issue.

## 2012-11-01 NOTE — Telephone Encounter (Signed)
Just finished radiation last week-throat really hurts and is unable to swallow much.  She reports being very weak and unable to come into her appt today.  She would like to know if Dr Antoine Poche has any suggestions about what to do.  Advised to call MD that did the radiation to see her and she if she is dehydrated as she has not been eating or drink very much at all.  She states she even if she she's them they won't give her much IV fluids because of her heart.  She is aware I will send information to Dr Antoine Poche for review and recommendations

## 2012-11-01 NOTE — Telephone Encounter (Signed)
Pt aware of Dr Hochrein's recommendations.  She will check with pharmacy and or radiation MD to see if there is anything else they can do to help.

## 2012-11-02 ENCOUNTER — Inpatient Hospital Stay (HOSPITAL_COMMUNITY)
Admission: EM | Admit: 2012-11-02 | Discharge: 2012-11-08 | DRG: 381 | Disposition: A | Discharge status: Skilled Nursing Facility | Payer: Medicare Other | Attending: Internal Medicine | Admitting: Internal Medicine

## 2012-11-02 ENCOUNTER — Inpatient Hospital Stay (HOSPITAL_COMMUNITY): Payer: Medicare Other

## 2012-11-02 ENCOUNTER — Emergency Department (HOSPITAL_COMMUNITY): Payer: Medicare Other

## 2012-11-02 ENCOUNTER — Telehealth: Payer: Self-pay | Admitting: Oncology

## 2012-11-02 ENCOUNTER — Ambulatory Visit
Admission: RE | Admit: 2012-11-02 | Discharge: 2012-11-02 | Disposition: A | Discharge status: Home / Self Care | Payer: Medicare Other | Source: Ambulatory Visit | Attending: Radiation Oncology | Admitting: Radiation Oncology

## 2012-11-02 ENCOUNTER — Encounter (HOSPITAL_COMMUNITY): Payer: Self-pay | Admitting: Emergency Medicine

## 2012-11-02 DIAGNOSIS — K2210 Ulcer of esophagus without bleeding: Principal | ICD-10-CM | POA: Diagnosis present

## 2012-11-02 DIAGNOSIS — J4489 Other specified chronic obstructive pulmonary disease: Secondary | ICD-10-CM | POA: Diagnosis present

## 2012-11-02 DIAGNOSIS — Z95810 Presence of automatic (implantable) cardiac defibrillator: Secondary | ICD-10-CM

## 2012-11-02 DIAGNOSIS — R1310 Dysphagia, unspecified: Secondary | ICD-10-CM | POA: Diagnosis present

## 2012-11-02 DIAGNOSIS — I509 Heart failure, unspecified: Secondary | ICD-10-CM | POA: Diagnosis present

## 2012-11-02 DIAGNOSIS — Z66 Do not resuscitate: Secondary | ICD-10-CM | POA: Diagnosis present

## 2012-11-02 DIAGNOSIS — J449 Chronic obstructive pulmonary disease, unspecified: Secondary | ICD-10-CM | POA: Diagnosis present

## 2012-11-02 DIAGNOSIS — C3430 Malignant neoplasm of lower lobe, unspecified bronchus or lung: Secondary | ICD-10-CM | POA: Diagnosis present

## 2012-11-02 DIAGNOSIS — I959 Hypotension, unspecified: Secondary | ICD-10-CM | POA: Diagnosis present

## 2012-11-02 DIAGNOSIS — I2589 Other forms of chronic ischemic heart disease: Secondary | ICD-10-CM | POA: Diagnosis present

## 2012-11-02 DIAGNOSIS — Z923 Personal history of irradiation: Secondary | ICD-10-CM

## 2012-11-02 DIAGNOSIS — E876 Hypokalemia: Secondary | ICD-10-CM | POA: Diagnosis present

## 2012-11-02 DIAGNOSIS — K208 Other esophagitis without bleeding: Secondary | ICD-10-CM | POA: Diagnosis present

## 2012-11-02 DIAGNOSIS — A498 Other bacterial infections of unspecified site: Secondary | ICD-10-CM | POA: Diagnosis present

## 2012-11-02 DIAGNOSIS — D638 Anemia in other chronic diseases classified elsewhere: Secondary | ICD-10-CM | POA: Diagnosis not present

## 2012-11-02 DIAGNOSIS — N179 Acute kidney failure, unspecified: Secondary | ICD-10-CM | POA: Diagnosis present

## 2012-11-02 DIAGNOSIS — Y842 Radiological procedure and radiotherapy as the cause of abnormal reaction of the patient, or of later complication, without mention of misadventure at the time of the procedure: Secondary | ICD-10-CM | POA: Diagnosis present

## 2012-11-02 DIAGNOSIS — R112 Nausea with vomiting, unspecified: Secondary | ICD-10-CM | POA: Diagnosis present

## 2012-11-02 DIAGNOSIS — I951 Orthostatic hypotension: Secondary | ICD-10-CM | POA: Diagnosis present

## 2012-11-02 DIAGNOSIS — T66XXXS Radiation sickness, unspecified, sequela: Secondary | ICD-10-CM

## 2012-11-02 DIAGNOSIS — N390 Urinary tract infection, site not specified: Secondary | ICD-10-CM | POA: Diagnosis present

## 2012-11-02 DIAGNOSIS — R109 Unspecified abdominal pain: Secondary | ICD-10-CM | POA: Diagnosis present

## 2012-11-02 DIAGNOSIS — I428 Other cardiomyopathies: Secondary | ICD-10-CM

## 2012-11-02 DIAGNOSIS — IMO0001 Reserved for inherently not codable concepts without codable children: Secondary | ICD-10-CM | POA: Diagnosis present

## 2012-11-02 DIAGNOSIS — I10 Essential (primary) hypertension: Secondary | ICD-10-CM | POA: Diagnosis present

## 2012-11-02 DIAGNOSIS — I5022 Chronic systolic (congestive) heart failure: Secondary | ICD-10-CM | POA: Diagnosis present

## 2012-11-02 DIAGNOSIS — K219 Gastro-esophageal reflux disease without esophagitis: Secondary | ICD-10-CM | POA: Diagnosis present

## 2012-11-02 DIAGNOSIS — I9589 Other hypotension: Secondary | ICD-10-CM | POA: Diagnosis present

## 2012-11-02 HISTORY — DX: Unspecified osteoarthritis, unspecified site: M19.90

## 2012-11-02 HISTORY — DX: Unspecified asthma, uncomplicated: J45.909

## 2012-11-02 HISTORY — DX: Heart failure, unspecified: I50.9

## 2012-11-02 HISTORY — DX: Malignant neoplasm of unspecified part of unspecified bronchus or lung: C34.90

## 2012-11-02 HISTORY — DX: Chronic obstructive pulmonary disease, unspecified: J44.9

## 2012-11-02 LAB — COMPREHENSIVE METABOLIC PANEL
Albumin: 3.3 g/dL — ABNORMAL LOW (ref 3.5–5.2)
Alkaline Phosphatase: 50 U/L (ref 39–117)
BUN: 19 mg/dL (ref 6–23)
Chloride: 91 mEq/L — ABNORMAL LOW (ref 96–112)
Glucose, Bld: 127 mg/dL — ABNORMAL HIGH (ref 70–99)
Potassium: 4.1 mEq/L (ref 3.5–5.1)
Total Bilirubin: 0.4 mg/dL (ref 0.3–1.2)

## 2012-11-02 LAB — CBC WITH DIFFERENTIAL/PLATELET
Basophils Relative: 0 % (ref 0–1)
Hemoglobin: 13.7 g/dL (ref 12.0–15.0)
Lymphs Abs: 0.7 10*3/uL (ref 0.7–4.0)
Monocytes Relative: 11 % (ref 3–12)
Neutro Abs: 5.3 10*3/uL (ref 1.7–7.7)
Neutrophils Relative %: 78 % — ABNORMAL HIGH (ref 43–77)
RBC: 4.49 MIL/uL (ref 3.87–5.11)

## 2012-11-02 LAB — URINALYSIS, ROUTINE W REFLEX MICROSCOPIC
Hgb urine dipstick: NEGATIVE
Ketones, ur: 15 mg/dL — AB
Nitrite: NEGATIVE
pH: 5.5 (ref 5.0–8.0)

## 2012-11-02 LAB — URINE MICROSCOPIC-ADD ON

## 2012-11-02 LAB — LIPASE, BLOOD: Lipase: 13 U/L (ref 11–59)

## 2012-11-02 MED ORDER — SODIUM CHLORIDE 0.9 % IV SOLN
INTRAVENOUS | Status: AC
  Administered 2012-11-02: 23:00:00 via INTRAVENOUS

## 2012-11-02 MED ORDER — FORMOTEROL FUMARATE 12 MCG IN CAPS
12.0000 ug | ORAL_CAPSULE | Freq: Two times a day (BID) | RESPIRATORY_TRACT | Status: DC
  Administered 2012-11-03 – 2012-11-08 (×9): 12 ug via RESPIRATORY_TRACT

## 2012-11-02 MED ORDER — ONDANSETRON HCL 4 MG/2ML IJ SOLN
4.0000 mg | Freq: Once | INTRAMUSCULAR | Status: AC
  Administered 2012-11-02: 4 mg via INTRAVENOUS
  Filled 2012-11-02: qty 2

## 2012-11-02 MED ORDER — ONDANSETRON HCL 4 MG PO TABS: 4.0000 mg | ORAL_TABLET | Freq: Four times a day (QID) | ORAL | Status: DC | PRN

## 2012-11-02 MED ORDER — PANTOPRAZOLE SODIUM 40 MG IV SOLR
40.0000 mg | Freq: Every day | INTRAVENOUS | Status: DC
  Administered 2012-11-02: 40 mg via INTRAVENOUS
  Filled 2012-11-02 (×2): qty 40

## 2012-11-02 MED ORDER — SODIUM CHLORIDE 0.9 % IV BOLUS (SEPSIS)
250.0000 mL | Freq: Once | INTRAVENOUS | Status: AC
  Administered 2012-11-02: 250 mL via INTRAVENOUS

## 2012-11-02 MED ORDER — PROMETHAZINE HCL 25 MG/ML IJ SOLN
12.5000 mg | Freq: Once | INTRAMUSCULAR | Status: AC
  Administered 2012-11-03: 12.5 mg via INTRAVENOUS
  Filled 2012-11-02: qty 1

## 2012-11-02 MED ORDER — DIAZEPAM 5 MG PO TABS: 5.0000 mg | ORAL_TABLET | Freq: Four times a day (QID) | ORAL | Status: DC | PRN

## 2012-11-02 MED ORDER — GABAPENTIN 300 MG PO CAPS
300.0000 mg | ORAL_CAPSULE | Freq: Two times a day (BID) | ORAL | Status: DC
  Administered 2012-11-02 – 2012-11-08 (×11): 300 mg via ORAL
  Filled 2012-11-02 (×13): qty 1

## 2012-11-02 MED ORDER — CARVEDILOL 3.125 MG PO TABS
3.1250 mg | ORAL_TABLET | Freq: Two times a day (BID) | ORAL | Status: DC
  Administered 2012-11-03 – 2012-11-08 (×9): 3.125 mg via ORAL
  Filled 2012-11-02 (×13): qty 1

## 2012-11-02 MED ORDER — IOHEXOL 300 MG/ML  SOLN
50.0000 mL | Freq: Once | INTRAMUSCULAR | Status: AC | PRN
  Administered 2012-11-02: 50 mL via ORAL

## 2012-11-02 MED ORDER — IOHEXOL 300 MG/ML  SOLN
100.0000 mL | Freq: Once | INTRAMUSCULAR | Status: AC | PRN
  Administered 2012-11-02: 100 mL via INTRAVENOUS

## 2012-11-02 MED ORDER — ACETAMINOPHEN 650 MG RE SUPP: 650.0000 mg | Freq: Four times a day (QID) | RECTAL | Status: DC | PRN

## 2012-11-02 MED ORDER — HYDROMORPHONE HCL PF 1 MG/ML IJ SOLN
1.0000 mg | Freq: Once | INTRAMUSCULAR | Status: AC
  Administered 2012-11-02: 1 mg via INTRAVENOUS
  Filled 2012-11-02: qty 1

## 2012-11-02 MED ORDER — ACETAMINOPHEN 325 MG PO TABS
650.0000 mg | ORAL_TABLET | Freq: Four times a day (QID) | ORAL | Status: DC | PRN
  Administered 2012-11-07: 650 mg via ORAL
  Filled 2012-11-02: qty 2

## 2012-11-02 MED ORDER — SODIUM CHLORIDE 0.9 % IV BOLUS (SEPSIS)
500.0000 mL | Freq: Once | INTRAVENOUS | Status: AC
  Administered 2012-11-02: 500 mL via INTRAVENOUS

## 2012-11-02 MED ORDER — LEVOFLOXACIN IN D5W 500 MG/100ML IV SOLN
500.0000 mg | Freq: Once | INTRAVENOUS | Status: AC
  Administered 2012-11-02: 500 mg via INTRAVENOUS
  Filled 2012-11-02: qty 100

## 2012-11-02 MED ORDER — ONDANSETRON HCL 4 MG/2ML IJ SOLN
4.0000 mg | Freq: Four times a day (QID) | INTRAMUSCULAR | Status: DC | PRN
  Administered 2012-11-02: 4 mg via INTRAVENOUS
  Filled 2012-11-02 (×2): qty 2

## 2012-11-02 MED ORDER — MORPHINE SULFATE 2 MG/ML IJ SOLN
1.0000 mg | INTRAMUSCULAR | Status: DC | PRN
  Administered 2012-11-03 – 2012-11-06 (×4): 1 mg via INTRAVENOUS
  Filled 2012-11-02 (×5): qty 1

## 2012-11-02 MED ORDER — SODIUM CHLORIDE 0.9 % IV SOLN: Freq: Once | INTRAVENOUS | Status: DC

## 2012-11-02 NOTE — Progress Notes (Signed)
   CARE MANAGEMENT ED NOTE 11/02/2012  Patient:  Heather Bullock, Heather Bullock   Account Number:  [id number]  Date Initiated:  11/02/2012  Documentation initiated by:  Radford Pax  Subjective/Objective Assessment:   Patient presents to ED withi difficulty swallowing and sore throat.     Subjective/Objective Assessment Detail:     Action/Plan:   Action/Plan Detail:   Anticipated DC Date:       Status Recommendation to Physician:   Result of Recommendation:    Other ED Services  Consult Working Plan    DC Planning Services  CM consult  Other    Choice offered to / List presented to:  C-1 Patient          Status of service:  Completed, signed off  ED Comments:   ED Comments Detail:  [facility] consulted to see patient regarding possible home health needs.  Patient's family at bedside.  Explained to patient and family that we can help arrange a visiting RN, PT, OT nursing aide and social worker to come to patient's house after discharge.  Informed patient and faily hat patient may have physical therapy while she is on the unit and will hellp [place] determine what level of care she may need at discharge.  Explained to patient and family that the unit CM will help facilitate the transition from hospital to home.  EDCM provided patient list of home health agencies in [facility].  Patient states she is familiar with [facility] and [place].  Patient and family thankful for information.

## 2012-11-02 NOTE — Progress Notes (Signed)
CC: Dr. Dorothy Puffer   Clinic note: The patient was asked to come in the clinic today after reporting severe "stomach pain, along with nausea and vomiting. She finished radiation therapy on 10/27/2012. She was treated to her chest for non-small cell lung cancer of the left lower lobe. Dr. Mitzi Hansen was her radiation oncologist. She did not receive chemotherapy. During her last week of therapy she had esophagitis and was given IV fluids. 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 She was given Zofran to take by mouth, but she has had difficulty keeping this down. She describes her stomach pain is being "8/10". She was orthostatic when she arrived in our department this afternoon. She did not take her Coreg or Lasix this morning.  Physical examination: She is pale. Filed Vitals:   11/02/12 1432  BP: 134/98  Pulse: 94  Temp:    Blood pressure standing 86/49. Head and neck examination: Her mouth is dry. There is no candidiasis. Abdomen: Soft with discomfort described along the epigastric region.  Laboratory data: CMP     Component Value Date/Time   NA 137 10/20/2012 1503   NA 138 08/16/2012 1225   K 4.1 10/20/2012 1503   K 4.5 08/16/2012 1225   CL 99 10/20/2012 1503   CL 99 08/16/2012 1225   CO2 30* 10/20/2012 1503   CO2 29 08/16/2012 1225   GLUCOSE 106* 10/20/2012 1503   GLUCOSE 99 08/16/2012 1225   BUN 20.3 10/20/2012 1503   BUN 25* 08/16/2012 1225   CREATININE 1.4* 10/20/2012 1503   CREATININE 1.3* 08/16/2012 1225   CALCIUM 9.6 10/20/2012 1503   CALCIUM 9.5 08/16/2012 1225   PROT 7.0 10/20/2012 1503   ALBUMIN 3.5 10/20/2012 1503   AST 16 10/20/2012 1503   ALT 8 10/20/2012 1503   ALKPHOS 48 10/20/2012 1503   BILITOT 0.42 10/20/2012 1503   GFRNONAA 33.07 08/06/2009 1228    Lab Results  Component Value Date   WBC 5.2 10/20/2012   HGB 13.0 10/20/2012   HCT 40.3 10/20/2012   MCV 93.5 10/20/2012   PLT 220 10/20/2012    Impression: Nausea/vomiting,  presumably from narcotic use for management of her radiation esophagitis. She is severely dehydrated and orthostatic. She may also have metabolic abnormalities secondary to dehydration.  Plan: I feel that she needs to be seen in the Emergency Department for further assessment to obtain blood chemistries and IV fluids. I communicated this to her son who is in agreement.

## 2012-11-02 NOTE — ED Provider Notes (Signed)
History    CSN: [id number] Arrival date & time 11/02/12  1512  First MD Initiated Contact with Patient 11/02/12 1514     Chief Complaint  Patient presents with  . Hypotension   (Consider location/radiation/quality/duration/timing/severity/associated sxs/prior Treatment) HPI Patient presents with concern of nausea, vomiting, generalized weakness and abdominal pain. The symptoms began 5 days ago, the day she completed her last radiation therapy session for metastatic lung cancer. Initially the patient had of nausea and generalized weakness.  The remainder of her symptoms developed over the ensuing days. The abdominal pain is epigastric, nonradiating, sore. No relief with anything. The patient has been intolerant of all medications and food since onset of symptoms. Patient saw her oncologist today, was referred here for further evaluation and management. Notably, in the oncology office she was found to be hypotensive and orthostatic.   Past Medical History  Diagnosis Date  . LBBB (left bundle branch block)   . Nonischemic cardiomyopathy     EF 25-30% echo 2010, 45% echo 2012  . Hypertension   . Diverticulitis   . ICD (implantable cardiac defibrillator) in place     CRT-D [employer]  . Chronic fatigue   . Fibromyalgia   . Shortness of breath     followed by Dr. Shelle Iron  . Elevated uric acid   . CRI (chronic renal insufficiency)   . Abdominal bloating   . Osteopenia    Past Surgical History  Procedure Laterality Date  . Crt-d-st. jude's    . Replacement total knee    . Abdominal hysterectomy    . Appendectomy    . Neck surgery    . Tonsillectomy    . Neck surgery  02/2007    cervical  . Laser surgery left eye  2013  . Oral surgery/infection in root of teeth  2013  . Video bronchoscopy Bilateral 08/12/2012    Procedure: VIDEO BRONCHOSCOPY WITHOUT FLUORO;  Surgeon: Barbaraann Share, MD;  Location: [facility];  Service: Cardiopulmonary;  Laterality: Bilateral;   Family  History  Problem Relation Age of Onset  . Heart disease Son   . Colon cancer Sister   . Lung cancer Father   . Heart disease Mother   . Heart disease Brother   . Hypertension Mother   . Diabetes Brother    History  Substance Use Topics  . Smoking status: Never Smoker   . Smokeless tobacco: Never Used  . Alcohol Use: No   OB History   Grav Para Term Preterm Abortions TAB SAB Ect Mult Living   3 3        2      Review of Systems  Constitutional:       Per HPI, otherwise negative  HENT:       Per HPI, otherwise negative  Respiratory:       Per HPI, otherwise negative  Cardiovascular:       Per HPI, otherwise negative  Gastrointestinal: Positive for nausea, vomiting and abdominal pain.  Endocrine:       Negative aside from HPI  Genitourinary:       Neg aside from HPI   Musculoskeletal:       Per HPI, otherwise negative  Skin: Negative.   Neurological: Positive for weakness. Negative for syncope.  Hematological:       History of present illness    Allergies  Bacitracin; Imuran; Metoclopramide hcl; Nabumetone; Neomycin-bacitracin zn-polymyx; Nitroglycerin; Nsaids; Nutritional supplements; and Plaquenil  Home Medications   Current Outpatient Rx  Name  Route  Sig  Dispense  Refill  . Acetaminophen (TYLENOL EXTRA STRENGTH PO)   Oral   Take 650 mg by mouth as needed.         . Alum & Mag Hydroxide-Simeth (MAGIC MOUTHWASH W/LIDOCAINE) SOLN      1part nystatin,1part Maaloxplus,1part benadryl,3part 2%viscous lidocaine. Swallow 10 mL up to QID, before meals/bedtime.  Called in [facility] [address] for refill on 10/15/2012 with 3 refills.         . Ascorbic Acid (VITAMIN C) 500 MG tablet   Oral   Take 1,000 mg by mouth daily.          Marland Kitchen aspirin 81 MG tablet   Oral   Take 81 mg by mouth daily.           . Calcium Citrate-Vitamin D (CALCIUM CITRATE + D PO)   Oral   Take by mouth daily.          . carvedilol (COREG) 3.125 MG tablet   Oral   Take  1 tablet (3.125 mg total) by mouth 2 (two) times daily with a meal.   180 tablet   6   . diazepam (VALIUM) 5 MG tablet   Oral   Take 5 mg by mouth every 6 (six) hours as needed.          . diclofenac (FLECTOR) 1.3 % PTCH   Transdermal   Place 1 patch onto the skin 2 (two) times daily.         Marland Kitchen docusate sodium (COLACE) 100 MG capsule      daily.          . formoterol (FORADIL AEROLIZER) 12 MCG capsule for inhaler   Inhalation   Place 1 capsule (12 mcg total) into inhaler and inhale 2 (two) times daily.   60 capsule   6   . furosemide (LASIX) 40 MG tablet      Take 2 pills (80 mg) two times per day   90 tablet   3   . furosemide (LASIX) 40 MG tablet   Oral   Take 2 tablets (80 mg total) by mouth 2 (two) times daily.   40 tablet   0   . gabapentin (NEURONTIN) 300 MG capsule   Oral   Take 300 mg by mouth 2 (two) times daily.          . Glucosamine-Chondroitin (OSTEO BI-FLEX REGULAR STRENGTH PO)   Oral   Take 2 tablets by mouth daily.           . hyaluronate sodium (RADIAPLEXRX) GEL   Topical   Apply 1 application topically 2 (two) times daily.         Marland Kitchen HYDROcodone-acetaminophen (NORCO) 10-325 MG per tablet   Oral   Take 1 tablet by mouth every 4 (four) hours as needed for pain.   45 tablet   0   . KLOR-CON M20 20 MEQ tablet      20 mEq.         Marland Kitchen losartan (COZAAR) 25 MG tablet   Oral   Take 0.5 tablets (12.5 mg total) by mouth 2 (two) times daily.   90 tablet   3   . Multiple Vitamin (MULTIVITAMIN) capsule   Oral   Take 1 capsule by mouth daily.           . NON FORMULARY      Mega Red daily          .  nystatin (MYCOSTATIN) 100000 UNIT/ML suspension   Oral   Take 5 mLs (500,000 Units total) by mouth 4 (four) times daily.   60 mL   0   . omeprazole (PRILOSEC) 20 MG capsule   Oral   Take 20 mg by mouth 2 (two) times daily.         . ondansetron (ZOFRAN) 8 MG tablet   Oral   Take 1 tablet (8 mg total) by mouth every 8  (eight) hours as needed for nausea.   20 tablet   0   . oxybutynin (DITROPAN) 5 MG tablet   Oral   Take 5 mg by mouth daily.           . polyethylene glycol powder (MIRALAX) powder   Oral   Take 17 g by mouth daily.   255 g   5   . potassium chloride (MICRO-K) 10 MEQ CR capsule   Oral   Take 4 capsules (40 mEq total) by mouth 2 (two) times daily.   240 capsule   11     Pt needs capsules as insurance will not cover liqu ...   . pravastatin (PRAVACHOL) 40 MG tablet   Oral   Take 40 mg by mouth daily.           . Psyllium (VEGETABLE LAXATIVE PO)      as needed.           . sucralfate (CARAFATE) 1 G tablet   Oral   Take 1 tablet (1 g total) by mouth 4 (four) times daily. Dissolve in 15 cc water.   120 tablet   1   . triamcinolone cream (KENALOG) 0.1 %      As needed         . Vitamin D, Ergocalciferol, (DRISDOL) 50000 UNITS CAPS   Oral   Take 50,000 Units by mouth every 7 (seven) days.            BP 135/58  Pulse 79  Temp(Src) 97.6 F (36.4 C) (Oral)  Resp 20  SpO2 97% Physical Exam  Nursing note and vitals reviewed. Constitutional: She is oriented to person, place, and time. She appears well-developed and well-nourished. No distress.  HENT:  Head: Normocephalic and atraumatic.  Eyes: Conjunctivae and EOM are normal.  Cardiovascular: Normal rate and regular rhythm.   Pulmonary/Chest: Effort normal and breath sounds normal. No stridor. No respiratory distress.  Abdominal: Soft. Normal appearance. She exhibits no distension. There is tenderness in the epigastric area.  Musculoskeletal: She exhibits no edema.  Neurological: She is alert and oriented to person, place, and time. No cranial nerve deficit.  Skin: Skin is warm and dry.  Psychiatric: She has a normal mood and affect.    ED Course  Procedures (including critical care time) Labs Reviewed  CBC WITH DIFFERENTIAL  COMPREHENSIVE METABOLIC PANEL  LIPASE, BLOOD  URINALYSIS, ROUTINE W  REFLEX MICROSCOPIC  PRO B NATRIURETIC PEPTIDE   Dg Abd Acute W/chest  11/02/2012   *RADIOLOGY REPORT*  Clinical Data: Nausea, vomiting and abdominal pain  ACUTE ABDOMEN SERIES (ABDOMEN 2 VIEW & CHEST 1 VIEW)  Comparison: 08/12/2012  Findings: There is a left chest wall AICD with leads in the right atrial appendage, coronary sinus and right ventricle.  The heart size appears mildly enlarged.  A small left pleural effusion is identified.  This is similar to previous exam.  Air-filled loops of large bowel are identified.  No abnormal small bowel dilatation identified.  On the decubitus  radiographs there is no evidence for free air or air-fluid levels.  IMPRESSION:  1.  Nonobstructive bowel gas pattern. 2.  Left pleural effusion.   Original Report Authenticated By: Signa Kell, M.D.   No diagnosis found.   A review of the patient's chart demonstrates that she has received greater than 20 radiation therapy sessions for her cancer. She was recently diagnosed with esophagitis, likely due to radiation therapy. She has a history of cardiomyopathy with EF 25% on recent echocardiogram.  O2 97% with nasal cannula abnormal Cardiac 80 sinus rhythm normal  Date: 11/02/2012  Rate: 81  Rhythm: normal sinus rhythm  QRS Axis: left  Intervals: normal  ST/T Wave abnormalities: nonspecific ST/T changes  Conduction Disutrbances:left bundle branch block  Narrative Interpretation:   Old EKG Reviewed: changes noted New LBBB, abnormal   8:20 PM All results again discussed with the patient and her family members.  Patient is hypotensive, this is persistent since a test for orthostasis.  She is, however, awake and alert, appropriately interactive. With evidence of UTI, persistent hypotension, she will be admitted. We confirmed goals of care, and her DO NOT RESUSCITATE status. MDM  This 79 year old female recently completed radiotherapy for lung cancer now presents with nausea, vomiting, listlessness.  On exam  the patient is awake alert, but uncomfortable appearing.  She is hypotensive, orthostatic, with evidence of early urinary tract infection and possible dehydration.  Patient's cardiomyopathy prohibits quick fluid resuscitation.  She'll be admitted for further evaluation and management with rehydration.  Gerhard Munch, MD 11/02/12 2022

## 2012-11-02 NOTE — ED Notes (Signed)
Pt c/o nausea, vomiting, hypotension associated with weakness and fatigue. Last radiation treatment last Thursday. Hx Lung Cancer.

## 2012-11-02 NOTE — ED Notes (Signed)
[id number]<ON> Expected date:<BR> Expected time:<BR> Means of arrival:<BR> Comments:<BR> hypotension

## 2012-11-02 NOTE — Progress Notes (Addendum)
Heather Bullock here in a wheelchair with her son for follow up.  She finished treatment on 7/2   She states that she has felt bad since last Wednesday.  She states that she has been in bed everyday.  She vomited once yesterday and twice today.  She is not able to keep anything down.  She has pain in her stomach that she is rating a 8/10.  She also has pain with swallowing.  She feels very weak and is very unsteady when standing.  She is dizzy when standing.  Orthostatic vitals were sitting bp 134/98 and standing 86/49.  She has not taken her coreg or lasix this morning. She has taken zofran and a norco tablet but thinks she threw them back up.  She does need a refill on her norco.    Late entry:  Report called to the triage nurse in [facility] ED.  [name] was escorted by Lorin Picket the transporter to the ED with her son.

## 2012-11-02 NOTE — H&P (Signed)
[facility] History and Physical  Heather Bullock ZOX:[id number] DOB: 03/22/1929 DOA: 11/02/2012  Referring physician: The ER physician. PCP: Thora Lance, MD   Chief Complaint: Nausea vomiting and abdominal pain.  HPI: Heather Bullock is a 79 y.o. female with history of non-small cell lung cancer presently on radiation therapy has been experiencing off and on nausea and pain on swallowing since her radiation therapy started. But over the last 2 days her pain worsened with nausea and vomiting. Her last radiation therapy was a week ago. The pain is mostly in the epigastric area. Patient states that she had at least 3-4 episodes of nausea and vomiting. Denies any associated diarrhea. She did call her radiation oncologist and since patient was found to be orthostatic was sent to the ER for further management. Acute abdominal series is unremarkable and patient has been admitted for further management. UA shows possibility of UTI. CT abdomen and pelvis is pending. In the ER patient was found to be mildly hypotensive and orthostatic. Patient was placed on gentle hydration given history of chronic systolic heart failure.  Review of Systems: As presented in the history of presenting illness, rest negative.  Past Medical History  Diagnosis Date  . LBBB (left bundle branch block)   . Nonischemic cardiomyopathy     EF 25-30% echo 2010, 45% echo 2012  . Hypertension   . Diverticulitis   . ICD (implantable cardiac defibrillator) in place     CRT-D [employer]  . Chronic fatigue   . Fibromyalgia   . Shortness of breath     followed by Dr. Shelle Iron  . Elevated uric acid   . CRI (chronic renal insufficiency)   . Abdominal bloating   . Osteopenia    Past Surgical History  Procedure Laterality Date  . Crt-d-st. jude's    . Replacement total knee    . Abdominal hysterectomy    . Appendectomy    . Neck surgery    . Tonsillectomy    . Neck surgery  02/2007    cervical  . Laser surgery left eye  2013   . Oral surgery/infection in root of teeth  2013  . Video bronchoscopy Bilateral 08/12/2012    Procedure: VIDEO BRONCHOSCOPY WITHOUT FLUORO;  Surgeon: Barbaraann Share, MD;  Location: [facility];  Service: Cardiopulmonary;  Laterality: Bilateral;   Social History:  reports that she has never smoked. She has never used smokeless tobacco. She reports that she does not drink alcohol or use illicit drugs. Home. where does patient live-- Can do ADLs. Can patient participate in ADLs?  Allergies  Allergen Reactions  . Bacitracin   . Imuran (Azathioprine)   . Metoclopramide Hcl     Reglan  . Nabumetone     Relafen  . Neomycin-Bacitracin Zn-Polymyx   . Nitroglycerin   . Nsaids   . Nutritional Supplements   . Plaquenil (Hydroxychloroquine Sulfate)   . Tizanidine     Lowers blood pressure    Family History  Problem Relation Age of Onset  . Heart disease Son   . Colon cancer Sister   . Lung cancer Father   . Heart disease Mother   . Heart disease Brother   . Hypertension Mother   . Diabetes Brother       Prior to Admission medications   Medication Sig Start Date End Date Taking? Authorizing Provider  Acetaminophen (TYLENOL EXTRA STRENGTH PO) Take 650 mg by mouth as needed.   Yes Historical Provider, MD  Alum & Mag  Hydroxide-Simeth (MAGIC MOUTHWASH W/LIDOCAINE) SOLN 1part nystatin,1part Maaloxplus,1part benadryl,3part 2%viscous lidocaine. Swallow 10 mL up to QID, before meals/bedtime.  Called in [facility] on [address] for refill on 10/15/2012 with 3 refills. 10/04/12  Yes Lonie Peak, MD  Ascorbic Acid (VITAMIN C) 500 MG tablet Take 1,000 mg by mouth daily.    Yes Historical Provider, MD  aspirin 81 MG tablet Take 81 mg by mouth daily.     Yes Historical Provider, MD  Calcium Citrate-Vitamin D (CALCIUM CITRATE + D PO) Take by mouth daily.    Yes Historical Provider, MD  carvedilol (COREG) 3.125 MG tablet Take 1 tablet (3.125 mg total) by mouth 2 (two) times daily with a meal.  02/05/12  Yes Rollene Rotunda, MD  diazepam (VALIUM) 5 MG tablet Take 5 mg by mouth every 6 (six) hours as needed.    Yes Historical Provider, MD  diclofenac (FLECTOR) 1.3 % PTCH Place 1 patch onto the skin 2 (two) times daily.   Yes Historical Provider, MD  docusate sodium (COLACE) 100 MG capsule daily.    Yes Historical Provider, MD  formoterol (FORADIL AEROLIZER) 12 MCG capsule for inhaler Place 1 capsule (12 mcg total) into inhaler and inhale 2 (two) times daily. 08/30/12  Yes Barbaraann Share, MD  furosemide (LASIX) 40 MG tablet Take 2 pills (80 mg) two times per day 08/10/12  Yes Rollene Rotunda, MD  gabapentin (NEURONTIN) 300 MG capsule Take 300 mg by mouth 2 (two) times daily.    Yes Historical Provider, MD  Glucosamine-Chondroitin (OSTEO BI-FLEX REGULAR STRENGTH PO) Take 2 tablets by mouth daily.     Yes Historical Provider, MD  hyaluronate sodium (RADIAPLEXRX) GEL Apply 1 application topically 2 (two) times daily.   Yes Historical Provider, MD  HYDROcodone-acetaminophen (NORCO) 10-325 MG per tablet Take 1 tablet by mouth every 4 (four) hours as needed for pain. 10/04/12  Yes Lonie Peak, MD  losartan (COZAAR) 25 MG tablet Take 0.5 tablets (12.5 mg total) by mouth 2 (two) times daily. 09/06/12  Yes Rollene Rotunda, MD  magnesium hydroxide (MILK OF MAGNESIA) 400 MG/5ML suspension Take 5 mLs by mouth daily as needed for constipation.   Yes Historical Provider, MD  Multiple Vitamin (MULTIVITAMIN) capsule Take 1 capsule by mouth daily.     Yes Historical Provider, MD  NON FORMULARY Mega Red daily    Yes Historical Provider, MD  nystatin (MYCOSTATIN) 100000 UNIT/ML suspension Take 5 mLs (500,000 Units total) by mouth 4 (four) times daily. 10/08/12  Yes Oneita Hurt, MD  omeprazole (PRILOSEC) 20 MG capsule Take 20 mg by mouth 2 (two) times daily.   Yes Historical Provider, MD  ondansetron (ZOFRAN) 8 MG tablet Take 1 tablet (8 mg total) by mouth every 8 (eight) hours as needed for nausea. 10/25/12  Yes  Jonna Coup, MD  oxybutynin (DITROPAN) 5 MG tablet Take 5 mg by mouth daily.     Yes Historical Provider, MD  polyethylene glycol powder (MIRALAX) powder Take 17 g by mouth daily. 10/08/12  Yes Oneita Hurt, MD  potassium chloride (MICRO-K) 10 MEQ CR capsule Take 4 capsules (40 mEq total) by mouth 2 (two) times daily. 10/13/12  Yes Rollene Rotunda, MD  pravastatin (PRAVACHOL) 40 MG tablet Take 40 mg by mouth daily.     Yes Historical Provider, MD  Psyllium (VEGETABLE LAXATIVE PO) as needed.     Yes Historical Provider, MD  triamcinolone cream (KENALOG) 0.1 % As needed 12/19/11  Yes Historical Provider, MD  Vitamin  D, Ergocalciferol, (DRISDOL) 50000 UNITS CAPS Take 50,000 Units by mouth every 7 (seven) days.     Yes Historical Provider, MD   Physical Exam: Filed Vitals:   11/02/12 1829 11/02/12 1915 11/02/12 1915 11/02/12 1917  BP: 117/58 113/51 98/54 84/43   Pulse: 88 68 72 73  Temp: 98.9 F (37.2 C)     TempSrc: Oral     Resp: 18     SpO2: 96%        General:  Well-developed and nourished.  Eyes: Anicteric no pallor.  ENT: No discharge from ears eyes nose mouth.  Neck: No mass felt.  Cardiovascular: S1-S2 heard.  Respiratory: No rhonchi or crepitations.  Abdomen: Soft nontender bowel sounds present.  Skin: No rash.  Musculoskeletal: No edema.  Psychiatric: His normal.  Neurologic: Alert oriented to time place and person. Moves all extremities.  Labs on Admission:  Basic Metabolic Panel:  Recent Labs Lab 11/02/12 1627  NA 134*  K 4.1  CL 91*  CO2 31  GLUCOSE 127*  BUN 19  CREATININE 1.31*  CALCIUM 9.9   Liver Function Tests:  Recent Labs Lab 11/02/12 1627  AST 24  ALT 7  ALKPHOS 50  BILITOT 0.4  PROT 7.2  ALBUMIN 3.3*    Recent Labs Lab 11/02/12 1627  LIPASE 13   No results found for this basename: AMMONIA,  in the last 168 hours CBC:  Recent Labs Lab 11/02/12 1627  WBC 6.8  NEUTROABS 5.3  HGB 13.7  HCT 41.1  MCV 91.5  PLT  238   Cardiac Enzymes: No results found for this basename: CKTOTAL, CKMB, CKMBINDEX, TROPONINI,  in the last 168 hours  BNP (last 3 results)  Recent Labs  12/26/11 1043 11/02/12 1627  PROBNP 63.0 325.7   CBG: No results found for this basename: GLUCAP,  in the last 168 hours  Radiological Exams on Admission: Dg Abd Acute W/chest  11/02/2012   *RADIOLOGY REPORT*  Clinical Data: Nausea, vomiting and abdominal pain  ACUTE ABDOMEN SERIES (ABDOMEN 2 VIEW & CHEST 1 VIEW)  Comparison: 08/12/2012  Findings: There is a left chest wall AICD with leads in the right atrial appendage, coronary sinus and right ventricle.  The heart size appears mildly enlarged.  A small left pleural effusion is identified.  This is similar to previous exam.  Air-filled loops of large bowel are identified.  No abnormal small bowel dilatation identified.  On the decubitus radiographs there is no evidence for free air or air-fluid levels.  IMPRESSION:  1.  Nonobstructive bowel gas pattern. 2.  Left pleural effusion.   Original Report Authenticated By: Signa Kell, M.D.     Assessment/Plan Principal Problem:   Nausea & vomiting Active Problems:   SYSTOLIC HEART FAILURE, CHRONIC   HYPOTENSION   Lung cancer, lower lobe   Abdominal pain   1. Nausea vomiting with epigastric pain - suspect this could be secondary to radiation esophagitis. Patient at this time we kept n.p.o. except medications with IV Protonix. CT abdomen is pending. In a.m. if patient's pain improves then may consider starting diet. 2. Possible UTI - check urine cultures. Patient has been placed on Levaquin. 3. Chronic systolic heart failure with chronic hypotension - since patient was orthostatic initially this time we will hold off antihypertensive and gently hydrate and closely follow her respiratory status. 4. Non-small cell lung cancer on radiation - per oncology. 5. COPD - presently not wheezing. 6.  Renal failure - probably secondary to  dehydration. Follow metabolic  panel after hydration.    Code Status: DO NOT RESUSCITATE.  Family Communication: None.  Disposition Plan: Admit to inpatient.    Cort Dragoo N. [facility] Pager [phone].  If 7PM-7AM, please contact night-coverage [email or web address] Password [facility] 11/02/2012, 8:39 PM

## 2012-11-02 NOTE — Telephone Encounter (Signed)
Received a call from [employer].  She says that she is not doing well.  She vomited yesterday morning and this morning.  She has been taking her zofran and norco.  She is having pain when swallowing and is not able to eat.  She is able to swallow a small amount of liquids.  Notified Dr. Dayton Scrape who would like her to come in today.  Called Cniyah back and she did not want to come in today.  She stated that she is too weak.  Told her that it is important that we see her and check her vitals to make sure she is not dehydrated.  She agreed to come in at 2:30 pm.  Notified Dr. Dayton Scrape.

## 2012-11-02 NOTE — Addendum Note (Signed)
Encounter addended by: Eduardo Osier, RN on: 11/02/2012  4:48 PM<BR>     Documentation filed: Notes Section

## 2012-11-03 ENCOUNTER — Ambulatory Visit: Payer: Medicare Other

## 2012-11-03 DIAGNOSIS — N179 Acute kidney failure, unspecified: Secondary | ICD-10-CM | POA: Diagnosis present

## 2012-11-03 DIAGNOSIS — E876 Hypokalemia: Secondary | ICD-10-CM | POA: Diagnosis present

## 2012-11-03 DIAGNOSIS — D638 Anemia in other chronic diseases classified elsewhere: Secondary | ICD-10-CM | POA: Diagnosis not present

## 2012-11-03 DIAGNOSIS — I5022 Chronic systolic (congestive) heart failure: Secondary | ICD-10-CM

## 2012-11-03 DIAGNOSIS — N390 Urinary tract infection, site not specified: Secondary | ICD-10-CM | POA: Diagnosis present

## 2012-11-03 DIAGNOSIS — I10 Essential (primary) hypertension: Secondary | ICD-10-CM

## 2012-11-03 LAB — CBC
Hemoglobin: 11.3 g/dL — ABNORMAL LOW (ref 12.0–15.0)
MCH: 30.5 pg (ref 26.0–34.0)
MCHC: 33.1 g/dL (ref 30.0–36.0)
MCV: 91.9 fL (ref 78.0–100.0)
Platelets: 204 10*3/uL (ref 150–400)

## 2012-11-03 LAB — BASIC METABOLIC PANEL
BUN: 15 mg/dL (ref 6–23)
CO2: 31 mEq/L (ref 19–32)
Calcium: 8.8 mg/dL (ref 8.4–10.5)
GFR calc non Af Amer: 43 mL/min — ABNORMAL LOW (ref >90)
Glucose, Bld: 97 mg/dL (ref 70–99)

## 2012-11-03 MED ORDER — LEVOFLOXACIN IN D5W 250 MG/50ML IV SOLN
250.0000 mg | INTRAVENOUS | Status: AC
  Administered 2012-11-03 – 2012-11-06 (×4): 250 mg via INTRAVENOUS
  Filled 2012-11-03 (×4): qty 50

## 2012-11-03 MED ORDER — ADULT MULTIVITAMIN LIQUID CH
5.0000 mL | Freq: Every day | ORAL | Status: DC
  Administered 2012-11-03 – 2012-11-08 (×6): 5 mL via ORAL
  Filled 2012-11-03 (×6): qty 5

## 2012-11-03 MED ORDER — BOOST / RESOURCE BREEZE PO LIQD
1.0000 | Freq: Three times a day (TID) | ORAL | Status: DC
  Administered 2012-11-03 – 2012-11-05 (×4): 1 via ORAL

## 2012-11-03 MED ORDER — POTASSIUM CHLORIDE CRYS ER 20 MEQ PO TBCR
40.0000 meq | EXTENDED_RELEASE_TABLET | Freq: Once | ORAL | Status: AC
  Administered 2012-11-03: 40 meq via ORAL
  Filled 2012-11-03: qty 2

## 2012-11-03 MED ORDER — PROCHLORPERAZINE EDISYLATE 5 MG/ML IJ SOLN
10.0000 mg | Freq: Four times a day (QID) | INTRAMUSCULAR | Status: DC | PRN
  Administered 2012-11-03: 10 mg via INTRAVENOUS
  Filled 2012-11-03: qty 2

## 2012-11-03 MED ORDER — PANTOPRAZOLE SODIUM 40 MG IV SOLR
40.0000 mg | Freq: Two times a day (BID) | INTRAVENOUS | Status: DC
  Administered 2012-11-03 – 2012-11-08 (×11): 40 mg via INTRAVENOUS
  Filled 2012-11-03 (×12): qty 40

## 2012-11-03 MED ORDER — FLUCONAZOLE IN SODIUM CHLORIDE 200-0.9 MG/100ML-% IV SOLN
200.0000 mg | INTRAVENOUS | Status: DC
  Administered 2012-11-03 – 2012-11-06 (×4): 200 mg via INTRAVENOUS
  Filled 2012-11-03 (×5): qty 100

## 2012-11-03 MED ORDER — ZOLPIDEM TARTRATE 5 MG PO TABS
5.0000 mg | ORAL_TABLET | Freq: Every day | ORAL | Status: DC
  Administered 2012-11-03 – 2012-11-07 (×5): 5 mg via ORAL
  Filled 2012-11-03 (×5): qty 1

## 2012-11-03 NOTE — Progress Notes (Addendum)
[facility] PROGRESS NOTE  Heather Bullock ZOX:[id number] DOB: [birth year] DOA: 11/02/2012 PCP: Thora Lance, MD  Brief narrative: 79 y.o. female with past medical history of non-small cell lung cancer on radiation therapy, systolic CHF status post cardiac defibrillator placement who presented to [facility] ED 11/02/2012 with worsening abdominal pain, associated burning sensation in upper abdomen as well as nausea and vomiting. Patient had last radiation therapy one week prior to this admission. No associated diarrhea or fevers.  In ED, vital signs were as follows: BP 84/43 which has recovered to 135/58 with IV fluids. Heart rate was 62 and temperature 97.6 F. patient was found to have urinalysis with moderate leukocyte esterases. Patient was started on Levaquin for possible urinary tract infection.  Assessment/Plan:  Principal Problem:   Nausea & vomiting, abdominal pain - also associated burning sensation in upper abdominal area - We'll add Protonix 40 mg IV daily as well as fluconazole 200 mg IV daily for possible radiation related esophagitis considering recent history of radiation therapy - Advance diet to clear liquid and see if patient tolerates it. - Antiemetics as needed  Active Problems:   HYPOTENSION - Initially hypotensive but now blood pressure at goal - Continue Coreg 3.125 mg by mouth twice a day   Lung cancer, lower lobe - Management per radiation oncology   UTI (urinary tract infection) - Continue Levaquin - Followup results of urine culture   Acute renal failure - Likely related to urinary tract infection - Creatinine trending down   Hypokalemia - Secondary to GI losses - We are repleting potassium daily - Followup BMP in the morning   Anemia of chronic disease - secondary to history of malignancy - hemoglobin stable - continue to monitor CBC  Code Status: DNR/DNI Family Communication: no family at the bedside Disposition Plan: home when stable  [name],  MD  [facility] Pager [phone]  If 7PM-7AM, please contact night-coverage [email or web address] Password [email or web address] 11/03/2012, 2:39 PM   LOS: 1 day   Consultants:  None   Procedures:  None   Antibiotics:  Levaquin 11/02/2012 -->  HPI/Subjective: Some nausea but no vomiting.  Objective: Filed Vitals:   11/02/12 2244 11/03/12 0600 11/03/12 0830 11/03/12 1356  BP: 133/53 120/48 134/69 138/77  Pulse: 71 62 67 69  Temp: 98.5 F (36.9 C) 98.4 F (36.9 C)  98.3 F (36.8 C)  TempSrc: Oral Oral  Oral  Resp: 16 18  18   Height: 5' 2.75" (1.594 m)     Weight: 71.668 kg (158 lb)     SpO2: 97% 92%  95%   No intake or output data in the 24 hours ending 11/03/12 1439  Exam:   General:  Pt is alert, follows commands appropriately, not in acute distress  Cardiovascular: Regular rate and rhythm, S1/S2, no murmurs, no rubs, no gallops  Respiratory: Clear to auscultation bilaterally, no wheezing, no crackles, no rhonchi  Abdomen: Soft, tender across mid abdomen, non distended, bowel sounds present, no guarding  Extremities: No edema, pulses DP and PT palpable bilaterally  Neuro: Grossly nonfocal  Data Reviewed: Basic Metabolic Panel:  Recent Labs Lab 11/02/12 1627 11/03/12 0525  NA 134* 137  K 4.1 3.1*  CL 91* 97  CO2 31 31  GLUCOSE 127* 97  BUN 19 15  CREATININE 1.31* 1.15*  CALCIUM 9.9 8.8   Liver Function Tests:  Recent Labs Lab 11/02/12 1627  AST 24  ALT 7  ALKPHOS 50  BILITOT 0.4  PROT 7.2  ALBUMIN 3.3*  Recent Labs Lab 11/02/12 1627 11/02/12 2150  LIPASE 13 12   No results found for this basename: AMMONIA,  in the last 168 hours CBC:  Recent Labs Lab 11/02/12 1627 11/03/12 0525  WBC 6.8 5.1  NEUTROABS 5.3  --   HGB 13.7 11.3*  HCT 41.1 34.1*  MCV 91.5 91.9  PLT 238 204   Cardiac Enzymes:  Recent Labs Lab 11/02/12 2150  TROPONINI <0.30   BNP: No components found with this basename: POCBNP,  CBG: No results found for this basename: GLUCAP,   in the last 168 hours  No results found for this or any previous visit (from the past 240 hour(s)).   Studies: Ct Abdomen Pelvis W Contrast 11/02/2012   * IMPRESSION: Left lower lobe airspace disease with small left effusion.  Cannot exclude pneumonia.  Cardiomegaly.  Sigmoid diverticulosis.   Original Report Authenticated By: Charlett Nose, M.D.   Dg Abd Acute W/chest 11/02/2012   * IMPRESSION:  1.  Nonobstructive bowel gas pattern. 2.  Left pleural effusion.   Original Report Authenticated By: Signa Kell, M.D.    Scheduled Meds: . carvedilol  3.125 mg Oral BID WC  . feeding supplement  1 Container Oral TID BM  . fluconazole (DIFLUCAN)   200 mg Intravenous Q24H  . formoterol  12 mcg Inhalation BID  . gabapentin  300 mg Oral BID  . levofloxacin (LEVAQUIN)   250 mg Intravenous Q24H  . multivitamin  5 mL Oral Daily  . pantoprazole (PROTONIX) IV  40 mg Intravenous Q12H  . zolpidem  5 mg Oral QHS

## 2012-11-03 NOTE — Progress Notes (Signed)
ANTIBIOTIC CONSULT NOTE - INITIAL  Pharmacy Consult for Levaquin Indication: UTI (uncomplicated)  Allergies  Allergen Reactions  . Bacitracin   . Imuran (Azathioprine)   . Metoclopramide Hcl     Reglan  . Nabumetone     Relafen  . Neomycin-Bacitracin Zn-Polymyx   . Nitroglycerin   . Nsaids   . Nutritional Supplements   . Other     No blood products - pt is of [employer]  . Plaquenil (Hydroxychloroquine Sulfate)   . Tizanidine     Lowers blood pressure    Patient Measurements: Height: 5' 2.75" (159.4 cm) Weight: 158 lb (71.668 kg) IBW/kg (Calculated) : 51.83   Vital Signs: Temp: 98.5 F (36.9 C) (07/08 2244) Temp src: Oral (07/08 2244) BP: 133/53 mmHg (07/08 2244) Pulse Rate: 71 (07/08 2244) Intake/Output from previous day:   Intake/Output from this shift:    Labs:  Recent Labs  11/02/12 1627  WBC 6.8  HGB 13.7  PLT 238  CREATININE 1.31*   Estimated Creatinine Clearance: 31.3 ml/min (by C-G formula based on Cr of 1.31). No results found for this basename: VANCOTROUGH, VANCOPEAK, VANCORANDOM, GENTTROUGH, GENTPEAK, GENTRANDOM, TOBRATROUGH, TOBRAPEAK, TOBRARND, AMIKACINPEAK, AMIKACINTROU, AMIKACIN,  in the last 72 hours   Microbiology: No results found for this or any previous visit (from the past 720 hour(s)).  Medical History: Past Medical History  Diagnosis Date  . LBBB (left bundle branch block)   . Nonischemic cardiomyopathy     EF 25-30% echo 2010, 45% echo 2012  . Hypertension   . Diverticulitis   . ICD (implantable cardiac defibrillator) in place     CRT-D St. Jude  . Chronic fatigue   . Fibromyalgia   . Shortness of breath     followed by Dr. Shelle Iron  . Elevated uric acid   . CRI (chronic renal insufficiency)   . Abdominal bloating   . Osteopenia     Medications:  Scheduled:  . carvedilol  3.125 mg Oral BID WC  . formoterol  12 mcg Inhalation BID  . gabapentin  300 mg Oral BID  . levofloxacin (LEVAQUIN) IV  250 mg  Intravenous Q24H  . pantoprazole (PROTONIX) IV  40 mg Intravenous QHS   Infusions:  . sodium chloride 100 mL/hr at 11/02/12 2257   Assessment: 79 yo admitted with n/v, weakness and abdominal pain.  Levaquin for UTI.  Goal of Therapy:  Treat infection  Plan:   Levaquin 500 mg IV x1 then 250mg  IV q24h.  F/U SCr/cultures as needed.  Susanne Greenhouse R 11/03/2012,12:40 AM

## 2012-11-03 NOTE — Progress Notes (Signed)
INITIAL NUTRITION ASSESSMENT  DOCUMENTATION CODES Per approved criteria  -Not Applicable   INTERVENTION: Recommend SLP evaluation Diet advancement per MD discretion (recommend full liqiuid diet as pt reports she cannot swallow solid or pureed food) Provide Resource Breeze TID Provide Liquid Multivitamin daily  NUTRITION DIAGNOSIS: Inadequate oral intake related to swallowing difficulty as evidenced by 8% wt loss in less than 2 months.   Goal: Pt to meet >/= 90% of their estimated nutrition needs  Monitor:  CT results Diet advancement PO intake SLP recommendations Weight Labs  Reason for Assessment: Malnutrition Screening Tool, score of 2  79 y.o. female  Admitting Dx: Nausea & vomiting  ASSESSMENT: 79 y.o. female with history of non-small cell lung cancer presently on radiation therapy has been experiencing off and on nausea and pain on swallowing since her radiation therapy started. But over the last 2 days her pain worsened with nausea and vomiting. Her last radiation therapy was a week ago. The pain is mostly in the epigastric area. Patient states that she had at least 3-4 episodes of nausea and vomiting. Denies any associated diarrhea. CT abdomen and pelvis is pending. Pt reports that her nausea is improving today but, she still vomited last night. Pt has not been eating or drinking anything except for applesauce with medications. Pt reports that PTA she was drinking smoothies using recipes from RD at [facility] but, she is not able to swallow any solid foods including pureed foods (pt is unsure how long this has been going on). Pt states that swallowing is painful and it feels like food gets stuck in her stomach. Pt does not like Ensure or Boost supplements but, does like Carnation Instant Breakfast. Pt is currently on clear liquid diet and is willing to try [employer].   Height: Ht Readings from Last 1 Encounters:  11/02/12 5' 2.75" (1.594 m)    Weight: Wt  Readings from Last 1 Encounters:  11/02/12 158 lb (71.668 kg)    Ideal Body Weight: 114 lbs  % Ideal Body Weight: 138%  Wt Readings from Last 10 Encounters:  11/02/12 158 lb (71.668 kg)  11/02/12 154 lb 14.4 oz (70.262 kg)  10/25/12 156 lb 4.8 oz (70.897 kg)  10/20/12 159 lb 8 oz (72.349 kg)  10/04/12 165 lb 9.6 oz (75.116 kg)  09/30/12 169 lb 6.4 oz (76.839 kg)  09/30/12 169 lb (76.658 kg)  09/24/12 171 lb (77.565 kg)  09/17/12 170 lb (77.111 kg)  09/08/12 172 lb (78.019 kg)    Usual Body Weight: 170 lbs  % Usual Body Weight: 93%  BMI:  Body mass index is 28.21 kg/(m^2).  Estimated Nutritional Needs: Kcal: 1620-1870 Protein: 86-100 grams Fluid: 2.1 L  Skin: intact; generalized edema  Diet Order: Clear Liquid  EDUCATION NEEDS: -No education needs identified at this time  No intake or output data in the 24 hours ending 11/03/12 1405  Last BM: 7/9  Labs:   Recent Labs Lab 11/02/12 1627 11/03/12 0525  NA 134* 137  K 4.1 3.1*  CL 91* 97  CO2 31 31  BUN 19 15  CREATININE 1.31* 1.15*  CALCIUM 9.9 8.8  GLUCOSE 127* 97    CBG (last 3)  No results found for this basename: GLUCAP,  in the last 72 hours  Scheduled Meds: . carvedilol  3.125 mg Oral BID WC  . fluconazole (DIFLUCAN) IV  200 mg Intravenous Q24H  . formoterol  12 mcg Inhalation BID  . gabapentin  300 mg Oral BID  .  levofloxacin (LEVAQUIN) IV  250 mg Intravenous Q24H  . pantoprazole (PROTONIX) IV  40 mg Intravenous Q12H  . zolpidem  5 mg Oral QHS    Continuous Infusions:   Past Medical History  Diagnosis Date  . LBBB (left bundle branch block)   . Nonischemic cardiomyopathy     EF 25-30% echo 2010, 45% echo 2012  . Hypertension   . Diverticulitis   . ICD (implantable cardiac defibrillator) in place     CRT-D St. Jude  . Chronic fatigue   . Fibromyalgia   . Shortness of breath     followed by Dr. Shelle Iron  . Elevated uric acid   . CRI (chronic renal insufficiency)   . Abdominal  bloating   . Osteopenia     Past Surgical History  Procedure Laterality Date  . Crt-d-st. jude's    . Replacement total knee    . Abdominal hysterectomy    . Appendectomy    . Neck surgery    . Tonsillectomy    . Neck surgery  02/2007    cervical  . Laser surgery left eye  2013  . Oral surgery/infection in root of teeth  2013  . Video bronchoscopy Bilateral 08/12/2012    Procedure: VIDEO BRONCHOSCOPY WITHOUT FLUORO;  Surgeon: Barbaraann Share, MD;  Location: [facility];  Service: Cardiopulmonary;  Laterality: Bilateral;    Ian Malkin RD, LDN Inpatient Clinical Dietitian Pager: [phone] After Hours Pager: [phone]

## 2012-11-04 DIAGNOSIS — N179 Acute kidney failure, unspecified: Secondary | ICD-10-CM

## 2012-11-04 DIAGNOSIS — D638 Anemia in other chronic diseases classified elsewhere: Secondary | ICD-10-CM

## 2012-11-04 DIAGNOSIS — C3430 Malignant neoplasm of lower lobe, unspecified bronchus or lung: Secondary | ICD-10-CM

## 2012-11-04 LAB — URINE CULTURE: Colony Count: >=100000

## 2012-11-04 LAB — BASIC METABOLIC PANEL
Calcium: 9 mg/dL (ref 8.4–10.5)
GFR calc non Af Amer: 46 mL/min — ABNORMAL LOW (ref >90)
Glucose, Bld: 101 mg/dL — ABNORMAL HIGH (ref 70–99)
Sodium: 141 mEq/L (ref 135–145)

## 2012-11-04 LAB — CBC
MCH: 30.4 pg (ref 26.0–34.0)
MCHC: 32.7 g/dL (ref 30.0–36.0)
Platelets: 233 10*3/uL (ref 150–400)

## 2012-11-04 MED ORDER — PROMETHAZINE HCL 25 MG/ML IJ SOLN
12.5000 mg | Freq: Four times a day (QID) | INTRAMUSCULAR | Status: DC | PRN
  Administered 2012-11-04: 25 mg via INTRAVENOUS
  Filled 2012-11-04: qty 1

## 2012-11-04 MED ORDER — PROCHLORPERAZINE EDISYLATE 5 MG/ML IJ SOLN: 10.0000 mg | Freq: Four times a day (QID) | INTRAMUSCULAR | Status: DC | PRN

## 2012-11-04 MED ORDER — LORAZEPAM 2 MG/ML IJ SOLN
1.0000 mg | Freq: Once | INTRAMUSCULAR | Status: AC
  Administered 2012-11-04: 1 mg via INTRAVENOUS
  Filled 2012-11-04: qty 1

## 2012-11-04 NOTE — Progress Notes (Signed)
[facility] PROGRESS NOTE  Heather Bullock RUE:[id number] DOB: 02/22/1929 DOA: 11/02/2012 PCP: Thora Lance, MD  Brief narrative: 79 y.o. female with past medical history of non-small cell lung cancer on radiation therapy, systolic CHF status post cardiac defibrillator placement who presented to [facility] ED 11/02/2012 with worsening abdominal pain, associated burning sensation in upper abdomen as well as nausea and vomiting. Patient had last radiation therapy one week prior to this admission. No associated diarrhea or fevers.  In ED, vital signs were as follows: BP 84/43 which has recovered to 135/58 with IV fluids. Heart rate was 62 and temperature 97.6 F. patient was found to have urinalysis with moderate leukocyte esterases. Patient was started on Levaquin for possible urinary tract infection.   Assessment/Plan:   Principal Problem:  Nausea & vomiting, abdominal pain  - also associated burning sensation in upper abdominal area likely related to history of recent radiation therapy - We will continue Protonix 40 mg IV daily and fluconazole 200 mg IV daily - Appreciate gastroenterology consult and their recommendations - Discontinue Compazine and Ativan Phenergan 25 mg when necessary IV every 6 hours for intractable nausea or vomiting; continue Zofran 4 mg IV every 6 hours as needed  - Diet as tolerated Active Problems:  HYPOTENSION  - Initially hypotensive with blood pressure now reasonably controlled, 121/54 - Continue Coreg 3.125 mg by mouth twice a day  Lung cancer, lower lobe  - Management per radiation oncology  UTI (urinary tract infection) secondary to Escherichia coli - Urine culture sensitive to Levaquin so we will continue this antibiotic Acute renal failure  - Likely related to urinary tract infection  - Creatinine is now within normal limits Hypokalemia  - Secondary to GI losses  - Potassium within normal limits today Anemia of chronic disease  - secondary to history of  malignancy  - hemoglobin stable at 11.6  Code Status: DNR/DNI  Family Communication: no family at the bedside  Disposition Plan: home when stable   [name], MD  [facility]  Pager [phone]   Consultants:  Gastroenterology (Dr. Randa Evens) Procedures:  None  Antibiotics:  Levaquin 11/02/2012 -->   If 7PM-7AM, please contact night-coverage [email or web address] Password [email or web address] 11/04/2012, 10:04 AM   LOS: 2 days    HPI/Subjective: Still reports a burning sensation in the upper abdominal area. No vomiting but does experience nausea.  Objective: Filed Vitals:   11/03/12 1356 11/03/12 1806 11/03/12 2200 11/04/12 0600  BP: 138/77 137/57 133/57 121/54  Pulse: 69 62 75 66  Temp: 98.3 F (36.8 C)  99.3 F (37.4 C) 99 F (37.2 C)  TempSrc: Oral  Oral Oral  Resp: 18  18 18   Height:      Weight:      SpO2: 95%  94% 92%   No intake or output data in the 24 hours ending 11/04/12 1004  Exam:   General:  Pt is alert, follows commands appropriately, not in acute distress  Cardiovascular: Regular rate and rhythm, S1/S2, no murmurs, no rubs, no gallops  Respiratory: Clear to auscultation bilaterally, no wheezing, no crackles, no rhonchi  Abdomen: Soft, non tender, non distended, bowel sounds present, no guarding  Extremities: No edema, pulses DP and PT palpable bilaterally  Neuro: Grossly nonfocal  Data Reviewed: Basic Metabolic Panel:  Recent Labs Lab 11/02/12 1627 11/03/12 0525 11/04/12 0515  NA 134* 137 141  K 4.1 3.1* 3.6  CL 91* 97 106  CO2 31 31 26   GLUCOSE 127* 97 101*  BUN 19 15 12  CREATININE 1.31* 1.15* 1.08  CALCIUM 9.9 8.8 9.0   Liver Function Tests:  Recent Labs Lab 11/02/12 1627  AST 24  ALT 7  ALKPHOS 50  BILITOT 0.4  PROT 7.2  ALBUMIN 3.3*    Recent Labs Lab 11/02/12 1627 11/02/12 2150  LIPASE 13 12   No results found for this basename: AMMONIA,  in the last 168 hours CBC:  Recent Labs Lab 11/02/12 1627 11/03/12 0525 11/04/12 0515   WBC 6.8 5.1 4.5  NEUTROABS 5.3  --   --   HGB 13.7 11.3* 11.6*  HCT 41.1 34.1* 35.5*  MCV 91.5 91.9 93.2  PLT 238 204 233   Cardiac Enzymes:  Recent Labs Lab 11/02/12 2150  TROPONINI <0.30   BNP: No components found with this basename: POCBNP,  CBG: No results found for this basename: GLUCAP,  in the last 168 hours  Recent Results (from the past 240 hour(s))  URINE CULTURE     Status: None   Collection Time    11/02/12  6:29 PM      Result Value Range Status   Specimen Description URINE, RANDOM   Final   Special Requests NONE   Final   Culture  Setup Time 11/03/2012 01:19   Final   Colony Count >=100,000 COLONIES/ML   Final   Culture ESCHERICHIA COLI   Final   Report Status 11/04/2012 FINAL   Final   Organism ID, Bacteria ESCHERICHIA COLI   Final     Studies: Ct Abdomen Pelvis W Contrast 11/02/2012   *IMPRESSION: Left lower lobe airspace disease with small left effusion.  Cannot exclude pneumonia.  Cardiomegaly.  Sigmoid diverticulosis.   Original Report Authenticated By: Charlett Nose, M.D.   Dg Abd Acute W/chest 11/02/2012   *IMPRESSION:  1.  Nonobstructive bowel gas pattern. 2.  Left pleural effusion.   Original Report Authenticated By: Signa Kell, M.D.    Scheduled Meds: . carvedilol  3.125 mg Oral BID WC  . feeding supplement  1 Container Oral TID BM  . fluconazole (DIFLUCAN)   200 mg Intravenous Q24H  . formoterol  12 mcg Inhalation BID  . gabapentin  300 mg Oral BID  . levofloxacin (LEVAQUIN)   250 mg Intravenous Q24H  . multivitamin  5 mL Oral Daily  . pantoprazole   40 mg Intravenous Q12H  . zolpidem  5 mg Oral QHS

## 2012-11-04 NOTE — Evaluation (Signed)
Physical Therapy Evaluation Patient Details Name: Heather Bullock MRN: [id number] DOB: 12-20-1928 Today's Date: 11/04/2012 Time: 1478-2956 PT Time Calculation (min): 26 min  PT Assessment / Plan / Recommendation History of Present Illness  79 y.o. female with h/o non-small cell lung cancer on radiation therapy, systolic CHF status post cardiac defibrillator placement who presented to [facility] ED 11/02/2012 with worsening abdominal pain, associated burning sensation in upper abdomen as well as nausea and vomiting. Patient had last radiation therapy one week prior to this admission.   Clinical Impression  **Heather Bullock has a h/o fibromyalgia and arthritis as well as SOB with activity. She recently completed 26 rounds of radiation for lung cancer and is now admitted with abdominal pain, UTI, renal failure. At home she was walking short distances with a cane and using a WC for going out of home. Today she ambulated 12'x2 pushing IV pole with min/guard assist. Distance limited by fatigue and SOB. She plans to DC home to her apartment and will hire an aide to assist with housekeeping. Family also able to assist. She has needed DME. She would benefit from acute PT to maximize safety and independence with mobility. *    PT Assessment  Patient needs continued PT services    Follow Up Recommendations  Home health PT    Does the patient have the potential to tolerate intense rehabilitation      Barriers to Discharge   pt stated she'll need assist with housekeeping, stated she has list of home health aide agencies    Equipment Recommendations  None recommended by PT    Recommendations for Other Services     Frequency Min 3X/week    Precautions / Restrictions Precautions Precautions: None Restrictions Weight Bearing Restrictions: No   Pertinent Vitals/Pain *5/10 abdomen RN aware**      Mobility  Bed Mobility Bed Mobility: Supine to Sit;Sitting - Scoot to Edge of Bed Supine to Sit: 6: Modified  independent (Device/Increase time);HOB elevated;With rails Sitting - Scoot to Edge of Bed: 6: Modified independent (Device/Increase time);With rail Transfers Transfers: Sit to Stand;Stand to Sit Sit to Stand: 5: Supervision;From toilet;From bed;With armrests;With upper extremity assist Stand to Sit: To bed;To toilet;With upper extremity assist;With armrests Details for Transfer Assistance: supervision for safety Ambulation/Gait Ambulation/Gait Assistance: 4: Min guard Ambulation Distance (Feet): 24 Feet (12' x 2 with seated rest break) Assistive device: Other (Comment) (IV pole) Ambulation/Gait Assistance Details: distance limited by fatigue/SOB, pt reports SOB is baseline and doctors don't know why she gets SOB Gait Pattern: Step-to pattern;Decreased step length - right;Decreased step length - left    Exercises     PT Diagnosis: Difficulty walking;Generalized weakness  PT Problem List: Decreased activity tolerance;Decreased mobility PT Treatment Interventions: Gait training;Functional mobility training;Therapeutic exercise;Therapeutic activities;Patient/family education     PT Goals(Current goals can be found in the care plan section) Acute Rehab PT Goals Patient Stated Goal: to be able to walk farther PT Goal Formulation: With patient Time For Goal Achievement: 11/18/12 Potential to Achieve Goals: Good  Visit Information  Last PT Received On: 11/04/12 Assistance Needed: +1 History of Present Illness: 79 y.o. female with h/o non-small cell lung cancer on radiation therapy, systolic CHF status post cardiac defibrillator placement who presented to [facility] ED 11/02/2012 with worsening abdominal pain, associated burning sensation in upper abdomen as well as nausea and vomiting. Patient had last radiation therapy one week prior to this admission.        Prior Functioning  Home Living Family/patient expects to be  discharged to:: Private residence Living Arrangements: Alone Available Help  at Discharge: Family;Available 24 hours/day Type of Home: Apartment Home Access: Level entry Home Layout: One level Home Equipment: Shower seat;Wheelchair - manual;Cane - single point;Walker - standard;Walker - 4 wheels;Grab bars - tub/shower;Grab bars - toilet Additional Comments: handicapped height commode Prior Function Level of Independence: Independent with assistive device(s) Comments: used cane to go out, used WC to go to out for about approx 6 mos  Communication Communication: No difficulties Dominant Hand: Right    Cognition  Cognition Arousal/Alertness: Awake/alert Behavior During Therapy: WFL for tasks assessed/performed Overall Cognitive Status: Within Functional Limits for tasks assessed    Extremity/Trunk Assessment Upper Extremity Assessment Upper Extremity Assessment: Overall WFL for tasks assessed Lower Extremity Assessment Lower Extremity Assessment: Overall WFL for tasks assessed Cervical / Trunk Assessment Cervical / Trunk Assessment: Normal   Balance Balance Balance Assessed: Yes Static Sitting Balance Static Sitting - Balance Support: Bilateral upper extremity supported;Feet supported Static Sitting - Level of Assistance: 7: Independent Static Sitting - Comment/# of Minutes: 3  End of Session PT - End of Session Activity Tolerance: Patient limited by fatigue Patient left: in bed Nurse Communication: Mobility status  GP     Ralene Bathe Kistler 11/04/2012, 9:38 AM [phone]

## 2012-11-04 NOTE — Consult Note (Signed)
[facility] CONSULT Reason for consult:Odynophagia Referring Physician: [facility]. PCP: Heather Bullock. Primary G.I.: Heather Bullock Figuero is an 79 y.o. Bullock.  HPI: 79 year old woman who has had a long history of esophageal reflux and has been followed by Heather Bullock. She has had esophageal strictures and was having some dysphagia in about 6 months ago with EGD esophageal dilatation to 16 mm. There was some mild gastritis no other gross abnormal findings in the esophagus. Her colonoscopies are up-to-date with less colonoscopy 2009 negative other than diverticulosis. Since her last EGD 12/13, she has been diagnosed with non-small cell lung cancer of the left lower lobe has been undergoing radiation therapy which was just completed 1 to 2 weeks ago. She reports that ever since beginning radiation therapy she has had progressive chest pain and epigastric pain with swallowing it is gotten progressively worse now is associated with severe nausea. The patient was treated with omeprazole, magic mouthwash, and Zofran is outpatient no improvement in her symptoms.she was admitted with the symptoms and probable UTI as well. She continues to have severe postprandial odynophagia and epigastric pain. Her multiple other problems include none ischemic cardiomyopathy with ICD/pacemaker,  Fibromyalgia and chronic fatigue, asthma and COPD.  Past Medical History  Diagnosis Date  . LBBB (left bundle branch block)   . Nonischemic cardiomyopathy     EF 25-30% echo 2010, 45% echo 2012  . Hypertension   . Diverticulitis   . ICD (implantable cardiac defibrillator) in place     CRT-D [employer]  . Chronic fatigue   . Fibromyalgia   . Shortness of breath     followed by Dr. Shelle Iron  . Elevated uric acid   . CRI (chronic renal insufficiency)   . Abdominal bloating   . Osteopenia     Past Surgical History  Procedure Laterality Date  . Crt-d-st. jude's    . Replacement total knee    . Abdominal  hysterectomy    . Appendectomy    . Neck surgery    . Tonsillectomy    . Neck surgery  02/2007    cervical  . Laser surgery left eye  2013  . Oral surgery/infection in root of teeth  2013  . Video bronchoscopy Bilateral 08/12/2012    Procedure: VIDEO BRONCHOSCOPY WITHOUT FLUORO;  Surgeon: Barbaraann Share, MD;  Location: [facility];  Service: Cardiopulmonary;  Laterality: Bilateral;    Family History  Problem Relation Age of Onset  . Heart disease Son   . Colon cancer Sister   . Lung cancer Father   . Heart disease Mother   . Heart disease Brother   . Hypertension Mother   . Diabetes Brother     Social History:  reports that she has never smoked. She has never used smokeless tobacco. She reports that she does not drink alcohol or use illicit drugs.  Allergies:  Allergies  Allergen Reactions  . Bacitracin   . Imuran (Azathioprine)   . Metoclopramide Hcl     Reglan  . Nabumetone     Relafen  . Neomycin-Bacitracin Zn-Polymyx   . Nitroglycerin   . Nsaids   . Nutritional Supplements   . Other     No blood products - pt is of [employer]  . Plaquenil (Hydroxychloroquine Sulfate)   . Tizanidine     Lowers blood pressure    Medications; . carvedilol  3.125 mg Oral BID WC  . feeding supplement  1 Container Oral TID BM  .  fluconazole (DIFLUCAN) IV  200 mg Intravenous Q24H  . formoterol  12 mcg Inhalation BID  . gabapentin  300 mg Oral BID  . levofloxacin (LEVAQUIN) IV  250 mg Intravenous Q24H  . multivitamin  5 mL Oral Daily  . pantoprazole (PROTONIX) IV  40 mg Intravenous Q12H  . zolpidem  5 mg Oral QHS   PRN Meds acetaminophen, acetaminophen, diazepam, morphine injection, ondansetron (ZOFRAN) IV, prochlorperazine Results for orders placed during the hospital encounter of 11/02/12 (from the past 48 hour(s))  CBC WITH DIFFERENTIAL     Status: Abnormal   Collection Time    11/02/12  4:27 PM      Result Value Range   WBC 6.8  4.0 - 10.5 K/uL   RBC 4.49   3.87 - 5.11 MIL/uL   Hemoglobin 13.7  12.0 - 15.0 g/dL   HCT 16.1  09.6 - 04.5 %   MCV 91.5  78.0 - 100.0 fL   MCH 30.5  26.0 - 34.0 pg   MCHC 33.3  30.0 - 36.0 g/dL   RDW 40.9  81.1 - 91.4 %   Platelets 238  150 - 400 K/uL   Neutrophils Relative % 78 (*) 43 - 77 %   Neutro Abs 5.3  1.7 - 7.7 K/uL   Lymphocytes Relative 10 (*) 12 - 46 %   Lymphs Abs 0.7  0.7 - 4.0 K/uL   Monocytes Relative 11  3 - 12 %   Monocytes Absolute 0.8  0.1 - 1.0 K/uL   Eosinophils Relative 1  0 - 5 %   Eosinophils Absolute 0.0  0.0 - 0.7 K/uL   Basophils Relative 0  0 - 1 %   Basophils Absolute 0.0  0.0 - 0.1 K/uL  COMPREHENSIVE METABOLIC PANEL     Status: Abnormal   Collection Time    11/02/12  4:27 PM      Result Value Range   Sodium 134 (*) 135 - 145 mEq/Bullock   Potassium 4.1  3.5 - 5.1 mEq/Bullock   Chloride 91 (*) 96 - 112 mEq/Bullock   CO2 31  19 - 32 mEq/Bullock   Glucose, Bld 127 (*) 70 - 99 mg/dL   BUN 19  6 - 23 mg/dL   Creatinine, Ser 7.82 (*) 0.50 - 1.10 mg/dL   Calcium 9.9  8.4 - 95.6 mg/dL   Total Protein 7.2  6.0 - 8.3 g/dL   Albumin 3.3 (*) 3.5 - 5.2 g/dL   AST 24  0 - 37 U/Bullock   ALT 7  0 - 35 U/Bullock   Alkaline Phosphatase 50  39 - 117 U/Bullock   Total Bilirubin 0.4  0.3 - 1.2 mg/dL   GFR calc non Af Amer 37 (*) >90 mL/min   GFR calc Af Amer 43 (*) >90 mL/min   Comment:            The eGFR has been calculated     using the CKD EPI equation.     This calculation has not been     validated in all clinical     situations.     eGFR's persistently     <90 mL/min signify     possible Chronic Kidney Disease.  LIPASE, BLOOD     Status: None   Collection Time    11/02/12  4:27 PM      Result Value Range   Lipase 13  11 - 59 U/Bullock  PRO B NATRIURETIC PEPTIDE     Status: None  Collection Time    11/02/12  4:27 PM      Result Value Range   Pro B Natriuretic peptide (BNP) 325.7  0 - 450 pg/mL  CG4 I-STAT (LACTIC ACID)     Status: None   Collection Time    11/02/12  4:34 PM      Result Value Range   Lactic Acid,  Venous 1.80  0.5 - 2.2 mmol/Bullock  URINALYSIS, ROUTINE W REFLEX MICROSCOPIC     Status: Abnormal   Collection Time    11/02/12  6:29 PM      Result Value Range   Color, Urine YELLOW  YELLOW   APPearance CLOUDY (*) CLEAR   Specific Gravity, Urine 1.020  1.005 - 1.030   pH 5.5  5.0 - 8.0   Glucose, UA NEGATIVE  NEGATIVE mg/dL   Hgb urine dipstick NEGATIVE  NEGATIVE   Bilirubin Urine SMALL (*) NEGATIVE   Ketones, ur 15 (*) NEGATIVE mg/dL   Protein, ur NEGATIVE  NEGATIVE mg/dL   Urobilinogen, UA 0.2  0.0 - 1.0 mg/dL   Nitrite NEGATIVE  NEGATIVE   Leukocytes, UA MODERATE (*) NEGATIVE  URINE MICROSCOPIC-ADD ON     Status: Abnormal   Collection Time    11/02/12  6:29 PM      Result Value Range   Squamous Epithelial / LPF RARE  RARE   WBC, UA 3-6  <3 WBC/hpf   Bacteria, UA FEW (*) RARE  URINE CULTURE     Status: None   Collection Time    11/02/12  6:29 PM      Result Value Range   Specimen Description URINE, RANDOM     Special Requests NONE     Culture  Setup Time 11/03/2012 01:19     Colony Count >=100,000 COLONIES/ML     Culture ESCHERICHIA COLI     Report Status 11/04/2012 FINAL     Organism ID, Bacteria ESCHERICHIA COLI    TROPONIN I     Status: None   Collection Time    11/02/12  9:50 PM      Result Value Range   Troponin I <0.30  <0.30 ng/mL   Comment:            Due to the release kinetics of cTnI,     a negative result within the first hours     of the onset of symptoms does not rule out     myocardial infarction with certainty.     If myocardial infarction is still suspected,     repeat the test at appropriate intervals.  LIPASE, BLOOD     Status: None   Collection Time    11/02/12  9:50 PM      Result Value Range   Lipase 12  11 - 59 U/Bullock  BASIC METABOLIC PANEL     Status: Abnormal   Collection Time    11/03/12  5:25 AM      Result Value Range   Sodium 137  135 - 145 mEq/Bullock   Potassium 3.1 (*) 3.5 - 5.1 mEq/Bullock   Comment: DELTA CHECK NOTED     REPEATED TO VERIFY    Chloride 97  96 - 112 mEq/Bullock   CO2 31  19 - 32 mEq/Bullock   Glucose, Bld 97  70 - 99 mg/dL   BUN 15  6 - 23 mg/dL   Creatinine, Ser 1.61 (*) 0.50 - 1.10 mg/dL   Calcium 8.8  8.4 - 09.6 mg/dL   GFR  calc non Af Amer 43 (*) >90 mL/min   GFR calc Af Amer 50 (*) >90 mL/min   Comment:            The eGFR has been calculated     using the CKD EPI equation.     This calculation has not been     validated in all clinical     situations.     eGFR's persistently     <90 mL/min signify     possible Chronic Kidney Disease.  CBC     Status: Abnormal   Collection Time    11/03/12  5:25 AM      Result Value Range   WBC 5.1  4.0 - 10.5 K/uL   RBC 3.71 (*) 3.87 - 5.11 MIL/uL   Hemoglobin 11.3 (*) 12.0 - 15.0 g/dL   Comment: REPEATED TO VERIFY     DELTA CHECK NOTED   HCT 34.1 (*) 36.0 - 46.0 %   MCV 91.9  78.0 - 100.0 fL   MCH 30.5  26.0 - 34.0 pg   MCHC 33.1  30.0 - 36.0 g/dL   RDW 86.5  78.4 - 69.6 %   Platelets 204  150 - 400 K/uL  CBC     Status: Abnormal   Collection Time    11/04/12  5:15 AM      Result Value Range   WBC 4.5  4.0 - 10.5 K/uL   RBC 3.81 (*) 3.87 - 5.11 MIL/uL   Hemoglobin 11.6 (*) 12.0 - 15.0 g/dL   HCT 29.5 (*) 28.4 - 13.2 %   MCV 93.2  78.0 - 100.0 fL   MCH 30.4  26.0 - 34.0 pg   MCHC 32.7  30.0 - 36.0 g/dL   RDW 44.0  10.2 - 72.5 %   Platelets 233  150 - 400 K/uL  BASIC METABOLIC PANEL     Status: Abnormal   Collection Time    11/04/12  5:15 AM      Result Value Range   Sodium 141  135 - 145 mEq/Bullock   Potassium 3.6  3.5 - 5.1 mEq/Bullock   Chloride 106  96 - 112 mEq/Bullock   Comment: DELTA CHECK NOTED   CO2 26  19 - 32 mEq/Bullock   Glucose, Bld 101 (*) 70 - 99 mg/dL   BUN 12  6 - 23 mg/dL   Creatinine, Ser 3.66  0.50 - 1.10 mg/dL   Calcium 9.0  8.4 - 44.0 mg/dL   GFR calc non Af Amer 46 (*) >90 mL/min   GFR calc Af Amer 54 (*) >90 mL/min   Comment:            The eGFR has been calculated     using the CKD EPI equation.     This calculation has not been     validated  in all clinical     situations.     eGFR's persistently     <90 mL/min signify     possible Chronic Kidney Disease.    Ct Abdomen Pelvis W Contrast  11/02/2012   *RADIOLOGY REPORT*  Clinical Data: Epigastric pain, nausea, vomiting.  CT ABDOMEN AND PELVIS WITH CONTRAST  Technique:  Multidetector CT imaging of the abdomen and pelvis was performed following the standard protocol during bolus administration of intravenous contrast.  Contrast: OMNIPAQUE IOHEXOL 300 MG/ML  SOLN,  Comparison: CT 08/22/2003  Findings: Left lower lobe airspace disease and small left pleural effusion partially imaged.  Minimal  atelectasis medially and posteriorly in the right lung base.  The heart is mildly enlarged. Pacer wires noted.  Liver, spleen, adrenals and kidneys grossly unremarkable.  There is mild distention of the gallbladder.  No visible stones, wall thickening or pericholecystic fluid.  Diffuse atrophy of the pancreas without focal abnormality.  Sigmoid diverticulosis.  No active diverticulitis.  Small bowel is decompressed as is the stomach, grossly unremarkable.  Urinary bladder unremarkable.  Prior hysterectomy.  No adnexal masses.  Aorta and iliac vessels are calcified, non-aneurysmal.  Degenerative disc and facet disease throughout the lumbar spine. No acute findings.  IMPRESSION: Left lower lobe airspace disease with small left effusion.  Cannot exclude pneumonia.  Cardiomegaly.  Sigmoid diverticulosis.   Original Report Authenticated By: Charlett Nose, M.D.   Dg Abd Acute W/chest  11/02/2012   *RADIOLOGY REPORT*  Clinical Data: Nausea, vomiting and abdominal pain  ACUTE ABDOMEN SERIES (ABDOMEN 2 VIEW & CHEST 1 VIEW)  Comparison: 08/12/2012  Findings: There is a left chest wall AICD with leads in the right atrial appendage, coronary sinus and right ventricle.  The heart size appears mildly enlarged.  A small left pleural effusion is identified.  This is similar to previous exam.  Air-filled loops of large  bowel are identified.  No abnormal small bowel dilatation identified.  On the decubitus radiographs there is no evidence for free air or air-fluid levels.  IMPRESSION:  1.  Nonobstructive bowel gas pattern. 2.  Left pleural effusion.   Original Report Authenticated By: Signa Kell, M.D.               Blood pressure 121/54, pulse 66, temperature 99 F (37.2 C), temperature source Oral, resp. rate 18, height 5' 2.75" (1.594 m), weight 71.668 kg (158 lb), SpO2 92.00%.  Physical exam:   General--pleasant white Bullock in no acute distress. Oral exam reveals good hydration and no sign of oral thrush Neck -- no lymphadenopathy Heart--regular rate and rhythm without murmurs are gallops Lungs--grossly clear Abdomen--mild epigastric tenderness   Assessment: 1.Odynophagia/epigastric pain. Probably due to radiation. There is no gross oral thrush on exam. I think endoscopy to evaluate other causes would be reasonable 2. GERD with history of esophageal stricture. Last dilated 5/13 to 16 mm 3. Non-small cell lung cancer LLL. Currently being treated with radiation therapy which was just finished a week or so ago 4. Nonischemic cardiomyopathy 5. ICD/pacemaker. 6. Chronic fatigue/fibromyalgia  Plan: We will plan to proceed with EGD at 10 AM tomorrow. Have discussed this with the patient and she is agreeable. This will be done to evaluate for infectious as well as radiation causes of her odynophagia.   Heather Bullock,Heather Bullock 11/04/2012, 12:56 PM

## 2012-11-05 ENCOUNTER — Encounter (HOSPITAL_COMMUNITY)
Admission: EM | Disposition: A | Discharge status: Skilled Nursing Facility | Payer: Self-pay | Source: Home / Self Care | Attending: Internal Medicine

## 2012-11-05 ENCOUNTER — Encounter (HOSPITAL_COMMUNITY): Payer: Self-pay | Admitting: *Deleted

## 2012-11-05 HISTORY — PX: ESOPHAGOGASTRODUODENOSCOPY: SHX5428

## 2012-11-05 SURGERY — EGD (ESOPHAGOGASTRODUODENOSCOPY)
Anesthesia: Moderate Sedation

## 2012-11-05 MED ORDER — LOSARTAN POTASSIUM 25 MG PO TABS
12.5000 mg | ORAL_TABLET | Freq: Every day | ORAL | Status: DC
  Administered 2012-11-05 – 2012-11-08 (×4): 12.5 mg via ORAL
  Filled 2012-11-05 (×4): qty 0.5

## 2012-11-05 MED ORDER — SUCRALFATE 1 GM/10ML PO SUSP
1.0000 g | Freq: Once | ORAL | Status: AC
  Administered 2012-11-05: 1 g via ORAL
  Filled 2012-11-05: qty 10

## 2012-11-05 MED ORDER — MIDAZOLAM HCL 10 MG/2ML IJ SOLN
INTRAMUSCULAR | Status: DC | PRN
  Administered 2012-11-05: 1 mg via INTRAVENOUS
  Administered 2012-11-05: 2 mg via INTRAVENOUS
  Administered 2012-11-05 (×2): 1 mg via INTRAVENOUS

## 2012-11-05 MED ORDER — MIDAZOLAM HCL 10 MG/2ML IJ SOLN
INTRAMUSCULAR | Status: AC
  Filled 2012-11-05: qty 2

## 2012-11-05 MED ORDER — ALBUTEROL SULFATE (5 MG/ML) 0.5% IN NEBU: 2.5000 mg | INHALATION_SOLUTION | RESPIRATORY_TRACT | Status: DC | PRN

## 2012-11-05 MED ORDER — FENTANYL CITRATE 0.05 MG/ML IJ SOLN
INTRAMUSCULAR | Status: DC | PRN
  Administered 2012-11-05 (×2): 25 ug via INTRAVENOUS

## 2012-11-05 MED ORDER — SUCRALFATE 1 GM/10ML PO SUSP
1.0000 g | Freq: Three times a day (TID) | ORAL | Status: DC
  Administered 2012-11-05 – 2012-11-08 (×11): 1 g via ORAL
  Filled 2012-11-05 (×15): qty 10

## 2012-11-05 MED ORDER — SODIUM CHLORIDE 0.9 % IV SOLN
INTRAVENOUS | Status: DC
  Administered 2012-11-05: 500 mL via INTRAVENOUS

## 2012-11-05 MED ORDER — FENTANYL CITRATE 0.05 MG/ML IJ SOLN
INTRAMUSCULAR | Status: AC
  Filled 2012-11-05: qty 2

## 2012-11-05 MED ORDER — ENOXAPARIN SODIUM 40 MG/0.4ML ~~LOC~~ SOLN
40.0000 mg | SUBCUTANEOUS | Status: DC
  Administered 2012-11-05 – 2012-11-07 (×3): 40 mg via SUBCUTANEOUS
  Filled 2012-11-05 (×4): qty 0.4

## 2012-11-05 MED ORDER — LOSARTAN POTASSIUM 25 MG PO TABS: 12.5000 mg | ORAL_TABLET | Freq: Two times a day (BID) | ORAL | Status: DC

## 2012-11-05 MED ORDER — BUTAMBEN-TETRACAINE-BENZOCAINE 2-2-14 % EX AERO
INHALATION_SPRAY | CUTANEOUS | Status: DC | PRN
  Administered 2012-11-05: 2 via TOPICAL

## 2012-11-05 NOTE — H&P (View-Only) (Signed)
[facility] CONSULT Reason for consult:Odynophagia Referring Physician: [facility]. PCP: Dr. Ehinger. Primary G.I.: Dr. Hayes  Heather Bullock is an 79 y.o. female.  HPI: 79-year-old woman who has had a long history of esophageal reflux and has been followed by Dr. Hayes. She has had esophageal strictures and was having some dysphagia in about 6 months ago with EGD esophageal dilatation to 16 mm. There was some mild gastritis no other gross abnormal findings in the esophagus. Her colonoscopies are up-to-date with less colonoscopy 2009 negative other than diverticulosis. Since her last EGD 12/13, she has been diagnosed with non-small cell lung cancer of the left lower lobe has been undergoing radiation therapy which was just completed 1 to 2 weeks ago. She reports that ever since beginning radiation therapy she has had progressive chest pain and epigastric pain with swallowing it is gotten progressively worse now is associated with severe nausea. The patient was treated with omeprazole, magic mouthwash, and Zofran is outpatient no improvement in her symptoms.she was admitted with the symptoms and probable UTI as well. She continues to have severe postprandial odynophagia and epigastric pain. Her multiple other problems include none ischemic cardiomyopathy with ICD/pacemaker,  Fibromyalgia and chronic fatigue, asthma and COPD.  Past Medical History  Diagnosis Date  . LBBB (left bundle branch block)   . Nonischemic cardiomyopathy     EF 25-30% echo 2010, 45% echo 2012  . Hypertension   . Diverticulitis   . ICD (implantable cardiac defibrillator) in place     CRT-D [employer]  . Chronic fatigue   . Fibromyalgia   . Shortness of breath     followed by Dr. Clance  . Elevated uric acid   . CRI (chronic renal insufficiency)   . Abdominal bloating   . Osteopenia     Past Surgical History  Procedure Laterality Date  . Crt-d-st. jude's    . Replacement total knee    . Abdominal  hysterectomy    . Appendectomy    . Neck surgery    . Tonsillectomy    . Neck surgery  02/2007    cervical  . Laser surgery left eye  2013  . Oral surgery/infection in root of teeth  2013  . Video bronchoscopy Bilateral 08/12/2012    Procedure: VIDEO BRONCHOSCOPY WITHOUT FLUORO;  Surgeon: Keith M Clance, MD;  Location: [facility];  Service: Cardiopulmonary;  Laterality: Bilateral;    Family History  Problem Relation Age of Onset  . Heart disease Son   . Colon cancer Sister   . Lung cancer Father   . Heart disease Mother   . Heart disease Brother   . Hypertension Mother   . Diabetes Brother     Social History:  reports that she has never smoked. She has never used smokeless tobacco. She reports that she does not drink alcohol or use illicit drugs.  Allergies:  Allergies  Allergen Reactions  . Bacitracin   . Imuran (Azathioprine)   . Metoclopramide Hcl     Reglan  . Nabumetone     Relafen  . Neomycin-Bacitracin Zn-Polymyx   . Nitroglycerin   . Nsaids   . Nutritional Supplements   . Other     No blood products - pt is of [employer]  . Plaquenil (Hydroxychloroquine Sulfate)   . Tizanidine     Lowers blood pressure    Medications; . carvedilol  3.125 mg Oral BID WC  . feeding supplement  1 Container Oral TID BM  .   fluconazole (DIFLUCAN) IV  200 mg Intravenous Q24H  . formoterol  12 mcg Inhalation BID  . gabapentin  300 mg Oral BID  . levofloxacin (LEVAQUIN) IV  250 mg Intravenous Q24H  . multivitamin  5 mL Oral Daily  . pantoprazole (PROTONIX) IV  40 mg Intravenous Q12H  . zolpidem  5 mg Oral QHS   PRN Meds acetaminophen, acetaminophen, diazepam, morphine injection, ondansetron (ZOFRAN) IV, prochlorperazine Results for orders placed during the hospital encounter of 11/02/12 (from the past 48 hour(s))  CBC WITH DIFFERENTIAL     Status: Abnormal   Collection Time    11/02/12  4:27 PM      Result Value Range   WBC 6.8  4.0 - 10.5 K/uL   RBC 4.49   3.87 - 5.11 MIL/uL   Hemoglobin 13.7  12.0 - 15.0 g/dL   HCT 41.1  36.0 - 46.0 %   MCV 91.5  78.0 - 100.0 fL   MCH 30.5  26.0 - 34.0 pg   MCHC 33.3  30.0 - 36.0 g/dL   RDW 14.7  11.5 - 15.5 %   Platelets 238  150 - 400 K/uL   Neutrophils Relative % 78 (*) 43 - 77 %   Neutro Abs 5.3  1.7 - 7.7 K/uL   Lymphocytes Relative 10 (*) 12 - 46 %   Lymphs Abs 0.7  0.7 - 4.0 K/uL   Monocytes Relative 11  3 - 12 %   Monocytes Absolute 0.8  0.1 - 1.0 K/uL   Eosinophils Relative 1  0 - 5 %   Eosinophils Absolute 0.0  0.0 - 0.7 K/uL   Basophils Relative 0  0 - 1 %   Basophils Absolute 0.0  0.0 - 0.1 K/uL  COMPREHENSIVE METABOLIC PANEL     Status: Abnormal   Collection Time    11/02/12  4:27 PM      Result Value Range   Sodium 134 (*) 135 - 145 mEq/L   Potassium 4.1  3.5 - 5.1 mEq/L   Chloride 91 (*) 96 - 112 mEq/L   CO2 31  19 - 32 mEq/L   Glucose, Bld 127 (*) 70 - 99 mg/dL   BUN 19  6 - 23 mg/dL   Creatinine, Ser 1.31 (*) 0.50 - 1.10 mg/dL   Calcium 9.9  8.4 - 10.5 mg/dL   Total Protein 7.2  6.0 - 8.3 g/dL   Albumin 3.3 (*) 3.5 - 5.2 g/dL   AST 24  0 - 37 U/L   ALT 7  0 - 35 U/L   Alkaline Phosphatase 50  39 - 117 U/L   Total Bilirubin 0.4  0.3 - 1.2 mg/dL   GFR calc non Af Amer 37 (*) >90 mL/min   GFR calc Af Amer 43 (*) >90 mL/min   Comment:            The eGFR has been calculated     using the CKD EPI equation.     This calculation has not been     validated in all clinical     situations.     eGFR's persistently     <90 mL/min signify     possible Chronic Kidney Disease.  LIPASE, BLOOD     Status: None   Collection Time    11/02/12  4:27 PM      Result Value Range   Lipase 13  11 - 59 U/L  PRO B NATRIURETIC PEPTIDE     Status: None     Collection Time    11/02/12  4:27 PM      Result Value Range   Pro B Natriuretic peptide (BNP) 325.7  0 - 450 pg/mL  CG4 I-STAT (LACTIC ACID)     Status: None   Collection Time    11/02/12  4:34 PM      Result Value Range   Lactic Acid,  Venous 1.80  0.5 - 2.2 mmol/L  URINALYSIS, ROUTINE W REFLEX MICROSCOPIC     Status: Abnormal   Collection Time    11/02/12  6:29 PM      Result Value Range   Color, Urine YELLOW  YELLOW   APPearance CLOUDY (*) CLEAR   Specific Gravity, Urine 1.020  1.005 - 1.030   pH 5.5  5.0 - 8.0   Glucose, UA NEGATIVE  NEGATIVE mg/dL   Hgb urine dipstick NEGATIVE  NEGATIVE   Bilirubin Urine SMALL (*) NEGATIVE   Ketones, ur 15 (*) NEGATIVE mg/dL   Protein, ur NEGATIVE  NEGATIVE mg/dL   Urobilinogen, UA 0.2  0.0 - 1.0 mg/dL   Nitrite NEGATIVE  NEGATIVE   Leukocytes, UA MODERATE (*) NEGATIVE  URINE MICROSCOPIC-ADD ON     Status: Abnormal   Collection Time    11/02/12  6:29 PM      Result Value Range   Squamous Epithelial / LPF RARE  RARE   WBC, UA 3-6  <3 WBC/hpf   Bacteria, UA FEW (*) RARE  URINE CULTURE     Status: None   Collection Time    11/02/12  6:29 PM      Result Value Range   Specimen Description URINE, RANDOM     Special Requests NONE     Culture  Setup Time 11/03/2012 01:19     Colony Count >=100,000 COLONIES/ML     Culture ESCHERICHIA COLI     Report Status 11/04/2012 FINAL     Organism ID, Bacteria ESCHERICHIA COLI    TROPONIN I     Status: None   Collection Time    11/02/12  9:50 PM      Result Value Range   Troponin I <0.30  <0.30 ng/mL   Comment:            Due to the release kinetics of cTnI,     a negative result within the first hours     of the onset of symptoms does not rule out     myocardial infarction with certainty.     If myocardial infarction is still suspected,     repeat the test at appropriate intervals.  LIPASE, BLOOD     Status: None   Collection Time    11/02/12  9:50 PM      Result Value Range   Lipase 12  11 - 59 U/L  BASIC METABOLIC PANEL     Status: Abnormal   Collection Time    11/03/12  5:25 AM      Result Value Range   Sodium 137  135 - 145 mEq/L   Potassium 3.1 (*) 3.5 - 5.1 mEq/L   Comment: DELTA CHECK NOTED     REPEATED TO VERIFY    Chloride 97  96 - 112 mEq/L   CO2 31  19 - 32 mEq/L   Glucose, Bld 97  70 - 99 mg/dL   BUN 15  6 - 23 mg/dL   Creatinine, Ser 1.15 (*) 0.50 - 1.10 mg/dL   Calcium 8.8  8.4 - 10.5 mg/dL   GFR   calc non Af Amer 43 (*) >90 mL/min   GFR calc Af Amer 50 (*) >90 mL/min   Comment:            The eGFR has been calculated     using the CKD EPI equation.     This calculation has not been     validated in all clinical     situations.     eGFR's persistently     <90 mL/min signify     possible Chronic Kidney Disease.  CBC     Status: Abnormal   Collection Time    11/03/12  5:25 AM      Result Value Range   WBC 5.1  4.0 - 10.5 K/uL   RBC 3.71 (*) 3.87 - 5.11 MIL/uL   Hemoglobin 11.3 (*) 12.0 - 15.0 g/dL   Comment: REPEATED TO VERIFY     DELTA CHECK NOTED   HCT 34.1 (*) 36.0 - 46.0 %   MCV 91.9  78.0 - 100.0 fL   MCH 30.5  26.0 - 34.0 pg   MCHC 33.1  30.0 - 36.0 g/dL   RDW 14.9  11.5 - 15.5 %   Platelets 204  150 - 400 K/uL  CBC     Status: Abnormal   Collection Time    11/04/12  5:15 AM      Result Value Range   WBC 4.5  4.0 - 10.5 K/uL   RBC 3.81 (*) 3.87 - 5.11 MIL/uL   Hemoglobin 11.6 (*) 12.0 - 15.0 g/dL   HCT 35.5 (*) 36.0 - 46.0 %   MCV 93.2  78.0 - 100.0 fL   MCH 30.4  26.0 - 34.0 pg   MCHC 32.7  30.0 - 36.0 g/dL   RDW 15.1  11.5 - 15.5 %   Platelets 233  150 - 400 K/uL  BASIC METABOLIC PANEL     Status: Abnormal   Collection Time    11/04/12  5:15 AM      Result Value Range   Sodium 141  135 - 145 mEq/L   Potassium 3.6  3.5 - 5.1 mEq/L   Chloride 106  96 - 112 mEq/L   Comment: DELTA CHECK NOTED   CO2 26  19 - 32 mEq/L   Glucose, Bld 101 (*) 70 - 99 mg/dL   BUN 12  6 - 23 mg/dL   Creatinine, Ser 1.08  0.50 - 1.10 mg/dL   Calcium 9.0  8.4 - 10.5 mg/dL   GFR calc non Af Amer 46 (*) >90 mL/min   GFR calc Af Amer 54 (*) >90 mL/min   Comment:            The eGFR has been calculated     using the CKD EPI equation.     This calculation has not been     validated  in all clinical     situations.     eGFR's persistently     <90 mL/min signify     possible Chronic Kidney Disease.    Ct Abdomen Pelvis W Contrast  11/02/2012   *RADIOLOGY REPORT*  Clinical Data: Epigastric pain, nausea, vomiting.  CT ABDOMEN AND PELVIS WITH CONTRAST  Technique:  Multidetector CT imaging of the abdomen and pelvis was performed following the standard protocol during bolus administration of intravenous contrast.  Contrast: 100mL OMNIPAQUE IOHEXOL 300 MG/ML  SOLN,  Comparison: CT 08/22/2003  Findings: Left lower lobe airspace disease and small left pleural effusion partially imaged.  Minimal   atelectasis medially and posteriorly in the right lung base.  The heart is mildly enlarged. Pacer wires noted.  Liver, spleen, adrenals and kidneys grossly unremarkable.  There is mild distention of the gallbladder.  No visible stones, wall thickening or pericholecystic fluid.  Diffuse atrophy of the pancreas without focal abnormality.  Sigmoid diverticulosis.  No active diverticulitis.  Small bowel is decompressed as is the stomach, grossly unremarkable.  Urinary bladder unremarkable.  Prior hysterectomy.  No adnexal masses.  Aorta and iliac vessels are calcified, non-aneurysmal.  Degenerative disc and facet disease throughout the lumbar spine. No acute findings.  IMPRESSION: Left lower lobe airspace disease with small left effusion.  Cannot exclude pneumonia.  Cardiomegaly.  Sigmoid diverticulosis.   Original Report Authenticated By: Kevin Dover, M.D.   Dg Abd Acute W/chest  11/02/2012   *RADIOLOGY REPORT*  Clinical Data: Nausea, vomiting and abdominal pain  ACUTE ABDOMEN SERIES (ABDOMEN 2 VIEW & CHEST 1 VIEW)  Comparison: 08/12/2012  Findings: There is a left chest wall AICD with leads in the right atrial appendage, coronary sinus and right ventricle.  The heart size appears mildly enlarged.  A small left pleural effusion is identified.  This is similar to previous exam.  Air-filled loops of large  bowel are identified.  No abnormal small bowel dilatation identified.  On the decubitus radiographs there is no evidence for free air or air-fluid levels.  IMPRESSION:  1.  Nonobstructive bowel gas pattern. 2.  Left pleural effusion.   Original Report Authenticated By: Taylor Stroud, M.D.               Blood pressure 121/54, pulse 66, temperature 99 F (37.2 C), temperature source Oral, resp. rate 18, height 5' 2.75" (1.594 m), weight 71.668 kg (158 lb), SpO2 92.00%.  Physical exam:   General--pleasant white female in no acute distress. Oral exam reveals good hydration and no sign of oral thrush Neck -- no lymphadenopathy Heart--regular rate and rhythm without murmurs are gallops Lungs--grossly clear Abdomen--mild epigastric tenderness   Assessment: 1.Odynophagia/epigastric pain. Probably due to radiation. There is no gross oral thrush on exam. I think endoscopy to evaluate other causes would be reasonable 2. GERD with history of esophageal stricture. Last dilated 5/13 to 16 mm 3. Non-small cell lung cancer LLL. Currently being treated with radiation therapy which was just finished a week or so ago 4. Nonischemic cardiomyopathy 5. ICD/pacemaker. 6. Chronic fatigue/fibromyalgia  Plan: We will plan to proceed with EGD at 10 AM tomorrow. Have discussed this with the patient and she is agreeable. This will be done to evaluate for infectious as well as radiation causes of her odynophagia.   Ashira Kirsten JR,Azia Toutant L 11/04/2012, 12:56 PM      

## 2012-11-05 NOTE — Interval H&P Note (Signed)
History and Physical Interval Note:  11/05/2012 11:23 AM  Heather Bullock  has presented today for surgery, with the diagnosis of odynophagia  The various methods of treatment have been discussed with the patient and family. After consideration of risks, benefits and other options for treatment, the patient has consented to  Procedure(s): ESOPHAGOGASTRODUODENOSCOPY (EGD) (N/A) as a surgical intervention .  The patient's history has been reviewed, patient examined, no change in status, stable for surgery.  I have reviewed the patient's chart and labs.  Questions were answered to the patient's satisfaction.     Juvencio Verdi JR,Salvador Bigbee L

## 2012-11-05 NOTE — Progress Notes (Signed)
PATIENT DETAILS Name: Heather Bullock Age: 79 y.o. Sex: female Date of Birth: [birth year] Admit Date: 11/02/2012 Admitting Physician Eduard Clos, MD ZOX:[id number] R, MD  Subjective: Tolerating some diet today-still with burning epigastric and chest pain  Assessment/Plan: Severe ulcerative esophagitis -presumed from Radiation therapy -now on full liquids -continue with empiric Fluconazole,PPI and carafate -slowly advance diet  HTN -BP stable -cautiously continue with Coreg  UTI -on Levaquin -plan on 5 day treatment-end date 7/12  Hx of COPD -lungs clear -on Formoterol inhaler -prn albuterol nebs  Acute Renal Failure -likely pre-renal -resolved with IVF -monitor lytes periodically  Anemia -suspect from chronic disease -monitor H/H periodically  Chronic Systolic CHF -EF of 25-30% on 1/47/82 -clinically compensated -Continue Coreg -resume Losartan-monitor BP and renal function closely -resume Lasix-in the next few days-as appetite improves  Hx of non-small cell lung cancer  -was getting RTx - which she completed a week prior to admission  Disposition: Remain inpatient  DVT Prophylaxis: Prophylactic Lovenox or Heparin or SCD's  Code Status: DNR  Family Communication None at bedside  Procedures:  None  CONSULTS:  GI   MEDICATIONS: Scheduled Meds: . carvedilol  3.125 mg Oral BID WC  . feeding supplement  1 Container Oral TID BM  . fluconazole (DIFLUCAN) IV  200 mg Intravenous Q24H  . formoterol  12 mcg Inhalation BID  . gabapentin  300 mg Oral BID  . levofloxacin (LEVAQUIN) IV  250 mg Intravenous Q24H  . multivitamin  5 mL Oral Daily  . pantoprazole (PROTONIX) IV  40 mg Intravenous Q12H  . sucralfate  1 g Oral TID WC & HS  . zolpidem  5 mg Oral QHS   Continuous Infusions:  PRN Meds:.acetaminophen, acetaminophen, diazepam, morphine injection, ondansetron (ZOFRAN) IV, prochlorperazine  Antibiotics: Anti-infectives   Start      Dose/Rate Route Frequency Ordered Stop   11/03/12 2000  Levofloxacin (LEVAQUIN) IVPB 250 mg     250 mg 50 mL/hr over 60 Minutes Intravenous Every 24 hours 11/03/12 0038     11/03/12 1000  fluconazole (DIFLUCAN) IVPB 200 mg     200 mg 100 mL/hr over 60 Minutes Intravenous Every 24 hours 11/03/12 0842     11/02/12 2000  levofloxacin (LEVAQUIN) IVPB 500 mg     500 mg 100 mL/hr over 60 Minutes Intravenous  Once 11/02/12 1953 11/02/12 2152       PHYSICAL EXAM: Vital signs in last 24 hours: Filed Vitals:   11/05/12 1208 11/05/12 1220 11/05/12 1325 11/05/12 1400  BP: 125/49 131/61 143/82 146/77  Pulse:   67 70  Temp:   99.6 F (37.6 C) 98.4 F (36.9 C)  TempSrc:   Oral Oral  Resp: 20 30 18 18   Height:      Weight:      SpO2: 93% 93% 94% 94%    Weight change:  Filed Weights   11/02/12 2244  Weight: 71.668 kg (158 lb)   Body mass index is 28.21 kg/(m^2).   Gen Exam: Awake and alert with clear speech.   Neck: Supple, No JVD.   Chest: B/L Clear.   CVS: S1 S2 Regular, no murmurs.  Abdomen: soft, BS +, non tender, non distended.  Extremities: no edema, lower extremities warm to touch. Neurologic: Non Focal.   Skin: No Rash.   Wounds: N/A.   Intake/Output from previous day: No intake or output data in the 24 hours ending 11/05/12 1456   LAB RESULTS: CBC  Recent Labs Lab 11/02/12 1627 11/03/12 0525  11/04/12 0515  WBC 6.8 5.1 4.5  HGB 13.7 11.3* 11.6*  HCT 41.1 34.1* 35.5*  PLT 238 204 233  MCV 91.5 91.9 93.2  MCH 30.5 30.5 30.4  MCHC 33.3 33.1 32.7  RDW 14.7 14.9 15.1  LYMPHSABS 0.7  --   --   MONOABS 0.8  --   --   EOSABS 0.0  --   --   BASOSABS 0.0  --   --     Chemistries   Recent Labs Lab 11/02/12 1627 11/03/12 0525 11/04/12 0515  NA 134* 137 141  K 4.1 3.1* 3.6  CL 91* 97 106  CO2 31 31 26   GLUCOSE 127* 97 101*  BUN 19 15 12   CREATININE 1.31* 1.15* 1.08  CALCIUM 9.9 8.8 9.0    CBG: No results found for this basename: GLUCAP,  in the  last 168 hours  GFR Estimated Creatinine Clearance: 37.9 ml/min (by C-G formula based on Cr of 1.08).  Coagulation profile No results found for this basename: INR, PROTIME,  in the last 168 hours  Cardiac Enzymes  Recent Labs Lab 11/02/12 2150  TROPONINI <0.30    No components found with this basename: POCBNP,  No results found for this basename: DDIMER,  in the last 72 hours No results found for this basename: HGBA1C,  in the last 72 hours No results found for this basename: CHOL, HDL, LDLCALC, TRIG, CHOLHDL, LDLDIRECT,  in the last 72 hours No results found for this basename: TSH, T4TOTAL, FREET3, T3FREE, THYROIDAB,  in the last 72 hours No results found for this basename: VITAMINB12, FOLATE, FERRITIN, TIBC, IRON, RETICCTPCT,  in the last 72 hours  Recent Labs  11/02/12 1627 11/02/12 2150  LIPASE 13 12    Urine Studies No results found for this basename: UACOL, UAPR, USPG, UPH, UTP, UGL, UKET, UBIL, UHGB, UNIT, UROB, ULEU, UEPI, UWBC, URBC, UBAC, CAST, CRYS, UCOM, BILUA,  in the last 72 hours  MICROBIOLOGY: Recent Results (from the past 240 hour(s))  URINE CULTURE     Status: None   Collection Time    11/02/12  6:29 PM      Result Value Range Status   Specimen Description URINE, RANDOM   Final   Special Requests NONE   Final   Culture  Setup Time 11/03/2012 01:19   Final   Colony Count >=100,000 COLONIES/ML   Final   Culture ESCHERICHIA COLI   Final   Report Status 11/04/2012 FINAL   Final   Organism ID, Bacteria ESCHERICHIA COLI   Final    RADIOLOGY STUDIES/RESULTS: Ct Abdomen Pelvis W Contrast  11/02/2012   *RADIOLOGY REPORT*  Clinical Data: Epigastric pain, nausea, vomiting.  CT ABDOMEN AND PELVIS WITH CONTRAST  Technique:  Multidetector CT imaging of the abdomen and pelvis was performed following the standard protocol during bolus administration of intravenous contrast.  Contrast: OMNIPAQUE IOHEXOL 300 MG/ML  SOLN,  Comparison: CT 08/22/2003  Findings:  Left lower lobe airspace disease and small left pleural effusion partially imaged.  Minimal atelectasis medially and posteriorly in the right lung base.  The heart is mildly enlarged. Pacer wires noted.  Liver, spleen, adrenals and kidneys grossly unremarkable.  There is mild distention of the gallbladder.  No visible stones, wall thickening or pericholecystic fluid.  Diffuse atrophy of the pancreas without focal abnormality.  Sigmoid diverticulosis.  No active diverticulitis.  Small bowel is decompressed as is the stomach, grossly unremarkable.  Urinary bladder unremarkable.  Prior hysterectomy.  No adnexal masses.  Aorta and iliac vessels are calcified, non-aneurysmal.  Degenerative disc and facet disease throughout the lumbar spine. No acute findings.  IMPRESSION: Left lower lobe airspace disease with small left effusion.  Cannot exclude pneumonia.  Cardiomegaly.  Sigmoid diverticulosis.   Original Report Authenticated By: Charlett Nose, M.D.   Dg Abd Acute W/chest  11/02/2012   *RADIOLOGY REPORT*  Clinical Data: Nausea, vomiting and abdominal pain  ACUTE ABDOMEN SERIES (ABDOMEN 2 VIEW & CHEST 1 VIEW)  Comparison: 08/12/2012  Findings: There is a left chest wall AICD with leads in the right atrial appendage, coronary sinus and right ventricle.  The heart size appears mildly enlarged.  A small left pleural effusion is identified.  This is similar to previous exam.  Air-filled loops of large bowel are identified.  No abnormal small bowel dilatation identified.  On the decubitus radiographs there is no evidence for free air or air-fluid levels.  IMPRESSION:  1.  Nonobstructive bowel gas pattern. 2.  Left pleural effusion.   Original Report Authenticated By: Signa Kell, M.D.    Jeoffrey Massed, MD  [facility] Pager:[phone]  If 7PM-7AM, please contact night-coverage [email or web address] Password [email or web address] 11/05/2012, 2:56 PM   LOS: 3 days

## 2012-11-05 NOTE — Progress Notes (Signed)
PT Cancellation Note  ___Treatment cancelled today due to medical issues with patient which prohibited therapy  _X_ Treatment cancelled today due to patient receiving procedure or test .......... EGD earlier and now resting in pm  ___ Treatment cancelled today due to patient's refusal to participate   ___ Treatment cancelled today due to  Felecia Shelling  PTA [facility]  Acute  Rehab Pager      [phone]

## 2012-11-05 NOTE — Progress Notes (Signed)
ANTIBIOTIC CONSULT NOTE - FOLLOW UP  Pharmacy Consult for Levaquin Indication: UTI (uncomplicated)  Allergies  Allergen Reactions  . Bacitracin   . Imuran (Azathioprine)   . Metoclopramide Hcl     Reglan  . Nabumetone     Relafen  . Neomycin-Bacitracin Zn-Polymyx   . Nitroglycerin   . Nsaids   . Nutritional Supplements   . Other     No blood products - pt is of [employer]  . Plaquenil (Hydroxychloroquine Sulfate)   . Tizanidine     Lowers blood pressure   Vital Signs: Temp: 99.6 F (37.6 C) (07/11 1325) Temp src: Oral (07/11 1325) BP: 143/82 mmHg (07/11 1325) Pulse Rate: 67 (07/11 1325) Intake/Output from previous day: 07/10 0701 - 07/11 0700 In: 480 [P.O.:480] Out: -  Intake/Output from this shift:    Labs:  Recent Labs  11/02/12 1627 11/03/12 0525 11/04/12 0515  WBC 6.8 5.1 4.5  HGB 13.7 11.3* 11.6*  PLT 238 204 233  CREATININE 1.31* 1.15* 1.08   Estimated Creatinine Clearance: 37.9 ml/min (by C-G formula based on Cr of 1.08). No results found for this basename: VANCOTROUGH, Leodis Binet, VANCORANDOM, GENTTROUGH, GENTPEAK, GENTRANDOM, TOBRATROUGH, TOBRAPEAK, TOBRARND, AMIKACINPEAK, AMIKACINTROU, AMIKACIN,  in the last 72 hours   Microbiology: Recent Results (from the past 720 hour(s))  URINE CULTURE     Status: None   Collection Time    11/02/12  6:29 PM      Result Value Range Status   Specimen Description URINE, RANDOM   Final   Special Requests NONE   Final   Culture  Setup Time 11/03/2012 01:19   Final   Colony Count >=100,000 COLONIES/ML   Final   Culture ESCHERICHIA COLI   Final   Report Status 11/04/2012 FINAL   Final   Organism ID, Bacteria ESCHERICHIA COLI   Final   Anti-infectives   Start     Dose/Rate Route Frequency Ordered Stop   11/03/12 2000  Levofloxacin (LEVAQUIN) IVPB 250 mg     250 mg 50 mL/hr over 60 Minutes Intravenous Every 24 hours 11/03/12 0038     11/03/12 1000  fluconazole (DIFLUCAN) IVPB 200 mg     200 mg 100  mL/hr over 60 Minutes Intravenous Every 24 hours 11/03/12 0842     11/02/12 2000  levofloxacin (LEVAQUIN) IVPB 500 mg     500 mg 100 mL/hr over 60 Minutes Intravenous  Once 11/02/12 1953 11/02/12 2152     Assessment: 84 yoF admit with N/V and abdominal pain. 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   UTI: cx growing E coli (pan sensitive, except resistant to Ampicillin, includes Levofloxacin- still receiving IV)  Day 4 Levaquin, Day 3 Fluconazole  Would suggest po Levaquin-aware of esophageal pain, but taking other po medications.  Consider d/c Fluconazole with radiation esophagitis noted.    SCr improved, clearance < 50 ml/min  Goal of Therapy:  Abx dose/schedule, indication appropriate  Plan:  No change to Levaquin dose Suggest po Levaquin when possible with esophagitis Consider d/c Fluconazole with EGD results  Otho Bellows PharmD Pager [phone] 11/05/2012, 2:32 PM

## 2012-11-05 NOTE — Op Note (Signed)
[facility] [address] [place], [address]   ENDOSCOPY PROCEDURE REPORT  PATIENT: Heather Bullock, Heather Bullock  MR#: [id number] BIRTHDATE: 12-05-1928 , 84  yrs. old GENDER: Female ENDOSCOPIST:Lyrik Dockstader Randa Evens, MD REFERRED BY:  [facility]. PCP: Dr. Manus Gunning PROCEDURE DATE:  11/05/2012 PROCEDURE:    EGD ASA CLASS:   class 4 INDICATIONS:   unfortunate woman who has recently been diagnosed with a non-small cell lung cancer and undergoing radiation therapy. This just finished about 1 week ago and she is head severe chest pain and odynophagia that has been refractory to antifungal medications. MEDICATION:   fentanyl 50 mcg, versed 5 mg IV TOPICAL ANESTHETIC:    cetacaine spray  DESCRIPTION OF PROCEDURE:   ThePentax upper endoscope was inserted into the esophagus with swallowing. Immediately upon entering the esophagus it was seen to be friable and ulcerated. This began about 5 cm from the UES. It was extremely friable and bled wherever touch with the scope. There were frank confluence ulcers extending down to the GE junction. The stomach was entered and examine in the forward in theretroflex view. no abnormalities were determined. Gastric outlet was widely patent. The duodenal bulb and the 2nd duodenum were normal. The scope was withdrawn in the initial findings were confirmed. The patient tolerated procedure fairly well.     COMPLICATIONS: None  ENDOSCOPIC IMPRESSION: 1. Severe ulcerative esophagitis. This involves the majority of the esophagus and is almost certainly related to radiation.  RECOMMENDATIONS: 1. Continue aggressive PPI therapy 2. We'll add carafate slurry    _______________________________ eSignedCarman Ching, MD 11/05/2012 11:53 AM

## 2012-11-06 LAB — CBC
HCT: 36.8 % (ref 36.0–46.0)
Hemoglobin: 11.9 g/dL — ABNORMAL LOW (ref 12.0–15.0)
MCH: 30 pg (ref 26.0–34.0)
MCHC: 32.3 g/dL (ref 30.0–36.0)
MCV: 92.7 fL (ref 78.0–100.0)

## 2012-11-06 LAB — BASIC METABOLIC PANEL
BUN: 9 mg/dL (ref 6–23)
Calcium: 9.1 mg/dL (ref 8.4–10.5)
Creatinine, Ser: 0.95 mg/dL (ref 0.50–1.10)
GFR calc non Af Amer: 54 mL/min — ABNORMAL LOW (ref >90)
Glucose, Bld: 104 mg/dL — ABNORMAL HIGH (ref 70–99)

## 2012-11-06 MED ORDER — MORPHINE SULFATE 2 MG/ML IJ SOLN
1.0000 mg | INTRAMUSCULAR | Status: DC | PRN
  Administered 2012-11-06 – 2012-11-08 (×12): 1 mg via INTRAVENOUS
  Filled 2012-11-06 (×13): qty 1

## 2012-11-06 MED ORDER — FLUCONAZOLE IN SODIUM CHLORIDE 400-0.9 MG/200ML-% IV SOLN
400.0000 mg | INTRAVENOUS | Status: DC
  Administered 2012-11-07 – 2012-11-08 (×2): 400 mg via INTRAVENOUS
  Filled 2012-11-06 (×2): qty 200

## 2012-11-06 NOTE — Progress Notes (Addendum)
[facility] PROGRESS NOTE  Heather Bullock ZOX:[id number] DOB: [birth year] DOA: 11/02/2012 PCP: Thora Lance, MD  Brief narrative: 79 y.o. female with past medical history of non-small cell lung cancer on radiation therapy, systolic CHF status post cardiac defibrillator placement who presented to [facility] ED 11/02/2012 with worsening abdominal pain, associated burning sensation in upper abdomen as well as nausea and vomiting. Patient had last radiation therapy one week prior to this admission. No associated diarrhea or fevers.  In ED, vital signs were as follows: BP 84/43 which has recovered to 135/58 with IV fluids. Heart rate was 62 and temperature 97.6 F. patient was found to have urinalysis with moderate leukocyte esterases. Patient was started on Levaquin for possible urinary tract infection. Hospital course is being complicated due to severe burning sensation in upper abdomen, epigastric area. It was noted on EGD done 11/05/2012 that the patient has severe erosive esophagitis and per GI we intensified PPI therapy and added sucralfate.   Assessment/Plan:   Principal Problem:  Nausea & vomiting, abdominal pain in the setting of recently completed radiation therapy for lung cancer - based on EGD, severe erosive esophagitis - continue protonix 40 mg IV Q 12 hours - appreciate GI following; no further recommendations at this point so they have signed off today. - increase fluconazole to 400 mg IV daily - increase morphine 1 mg frequency to every 2 hours IV PRN - continue supportive care with antiemetics - Diet as tolerated  Severe erosive esophagitis - management as above  Active Problems:  HYPOTENSION  - BP normalized, BP 138/54 - Continue Coreg 3.125 mg by mouth twice a day and losartan 12.5 mg daily Lung cancer, lower lobe  - Management per radiation oncology  UTI (urinary tract infection) secondary to Escherichia coli  - Urine culture sensitive to Levaquin so we will continue Levaquin  (started 11/02/2012) Acute renal failure  - Likely related to urinary tract infection  - Creatinine is now within normal limits Hypokalemia  - Secondary to GI losses  - Potassium within normal limits  Anemia of chronic disease  - secondary to history of malignancy  - hemoglobin stable   Code Status: DNR/DNI  Family Communication: no family at the bedside  Disposition Plan: home when stable   [name], MD  [facility]  Pager [phone]   Consultants:  Gastroenterology (Dr. Randa Evens) Procedures:  None  Antibiotics:  Levaquin 11/02/2012 --> Fluconazole 11/03/2012 -->    If 7PM-7AM, please contact night-coverage [email or web address] Password [facility] 11/06/2012, 11:53 AM   LOS: 4 days    HPI/Subjective: Complains of abdominal pain, worse than yesterday but was somewhat relieved by morphine.   Objective: Filed Vitals:   11/05/12 1325 11/05/12 1400 11/05/12 2100 11/06/12 0600  BP: 143/82 146/77 132/53 138/54  Pulse: 67 70 70 69  Temp: 99.6 F (37.6 C) 98.4 F (36.9 C) 99.6 F (37.6 C) 99.2 F (37.3 C)  TempSrc: Oral Oral Oral Oral  Resp: 18 18 22 20   Height:      Weight:      SpO2: 94% 94% 95%     Intake/Output Summary (Last 24 hours) at 11/06/12 1153 Last data filed at 11/06/12 0703  Gross per 24 hour  Intake 714.67 ml  Output      0 ml  Net 714.67 ml    Exam:   General:  Pt is alert, follows commands appropriately, not in acute distress  Cardiovascular: Regular rate and rhythm, S1/S2, no murmurs, no rubs, no gallops  Respiratory: Clear to auscultation  bilaterally, no wheezing, no crackles, no rhonchi  Abdomen: Soft, tender in epigastric area, non distended, bowel sounds present, no guarding  Extremities: No edema, pulses DP and PT palpable bilaterally  Neuro: Grossly nonfocal  Data Reviewed: Basic Metabolic Panel:  Recent Labs Lab 11/02/12 1627 11/03/12 0525 11/04/12 0515 11/06/12 0550  NA 134* 137 141 140  K 4.1 3.1* 3.6 3.5  CL 91* 97 106 109  CO2 31  31 26 22   GLUCOSE 127* 97 101* 104*  BUN 19 15 12 9   CREATININE 1.31* 1.15* 1.08 0.95  CALCIUM 9.9 8.8 9.0 9.1   Liver Function Tests:  Recent Labs Lab 11/02/12 1627  AST 24  ALT 7  ALKPHOS 50  BILITOT 0.4  PROT 7.2  ALBUMIN 3.3*    Recent Labs Lab 11/02/12 1627 11/02/12 2150  LIPASE 13 12   No results found for this basename: AMMONIA,  in the last 168 hours CBC:  Recent Labs Lab 11/02/12 1627 11/03/12 0525 11/04/12 0515 11/06/12 0550  WBC 6.8 5.1 4.5 4.3  NEUTROABS 5.3  --   --   --   HGB 13.7 11.3* 11.6* 11.9*  HCT 41.1 34.1* 35.5* 36.8  MCV 91.5 91.9 93.2 92.7  PLT 238 204 233 209   Cardiac Enzymes:  Recent Labs Lab 11/02/12 2150  TROPONINI <0.30   BNP: No components found with this basename: POCBNP,  CBG: No results found for this basename: GLUCAP,  in the last 168 hours  URINE CULTURE     Status: None   Collection Time    11/02/12  6:29 PM      Result Value Range Status   Specimen Description URINE, RANDOM   Final   Special Requests NONE   Final   Culture  Setup Time 11/03/2012 01:19   Final   Colony Count >=100,000 COLONIES/ML   Final   Culture ESCHERICHIA COLI   Final   Report Status 11/04/2012 FINAL   Final   Organism ID, Bacteria ESCHERICHIA COLI   Final     Studies: No results found.  Scheduled Meds: . carvedilol  3.125 mg Oral BID WC  . enoxaparin (LOVENOX) i  40 mg Subcutaneous Q24H  . feeding supplement  1 Container Oral TID BM  . fluconazole (DIFLUCAN)   200 mg Intravenous Q24H  . formoterol  12 mcg Inhalation BID  . gabapentin  300 mg Oral BID  . levofloxacin (LEVAQUIN)   250 mg Intravenous Q24H  . losartan  12.5 mg Oral Daily  . multivitamin  5 mL Oral Daily  . pantoprazole   40 mg Intravenous Q12H  . sucralfate  1 g Oral TID WC & HS  . zolpidem  5 mg Oral QHS

## 2012-11-06 NOTE — Progress Notes (Signed)
[facility] Progress Note  Subjective: Pt still with significant odynophagia but able to keep down soft solids and liquids  Objective: Vital signs in last 24 hours: Temp:  [98.2 F (36.8 C)-99.6 F (37.6 C)] 99.2 F (37.3 C) (07/12 0600) Pulse Rate:  [67-75] 69 (07/12 0600) Resp:  [16-30] 20 (07/12 0600) BP: (125-148)/(49-82) 138/54 mmHg (07/12 0600) SpO2:  [92 %-95 %] 95 % (07/11 2100) Weight change:    WU:[id number], oriented, NAD  Lab Results: Results for orders placed during the hospital encounter of 11/02/12 (from the past 24 hour(s))  CBC     Status: Abnormal   Collection Time    11/06/12  5:50 AM      Result Value Range   WBC 4.3  4.0 - 10.5 K/uL   RBC 3.97  3.87 - 5.11 MIL/uL   Hemoglobin 11.9 (*) 12.0 - 15.0 g/dL   HCT 14.7  82.9 - 56.2 %   MCV 92.7  78.0 - 100.0 fL   MCH 30.0  26.0 - 34.0 pg   MCHC 32.3  30.0 - 36.0 g/dL   RDW 13.0  86.5 - 78.4 %   Platelets 209  150 - 400 K/uL  BASIC METABOLIC PANEL     Status: Abnormal   Collection Time    11/06/12  5:50 AM      Result Value Range   Sodium 140  135 - 145 mEq/L   Potassium 3.5  3.5 - 5.1 mEq/L   Chloride 109  96 - 112 mEq/L   CO2 22  19 - 32 mEq/L   Glucose, Bld 104 (*) 70 - 99 mg/dL   BUN 9  6 - 23 mg/dL   Creatinine, Ser 6.96  0.50 - 1.10 mg/dL   Calcium 9.1  8.4 - 29.5 mg/dL   GFR calc non Af Amer 54 (*) >90 mL/min   GFR calc Af Amer 63 (*) >90 mL/min    Studies/Results: No results found.    Assessment: Radiation esophagitis from XRT for lung CA  Plan: Cont PPI, carafate oral suspension , analgesics as needed, advance diet as tolerated. Will sign off for now. Call if further GI input needed   Deatra Mcmahen C 11/06/2012, 8:51 AM

## 2012-11-07 MED ORDER — NITROFURANTOIN MONOHYD MACRO 100 MG PO CAPS: 100.0000 mg | ORAL_CAPSULE | Freq: Two times a day (BID) | ORAL | Status: DC

## 2012-11-07 NOTE — Progress Notes (Signed)
Physical Therapy Treatment Patient Details Name: Heather Bullock MRN: [id number] DOB: 11/25/1928 Today's Date: 11/07/2012 Time: 4098-1191 PT Time Calculation (min): 15 min  PT Assessment / Plan / Recommendation  PT Comments   Progressing somewhat with mobility. Pt able to ambulate further but required increased assistance and use of walker. Discussed d/c plan- pt lives alone and would benefit from ST rehab to maximize independence and safety with mobility. Pt had questions regarding CIR placement, however do not feel pt would be a candidate.   Follow Up Recommendations  SNF (unless mobility improves)     Does the patient have the potential to tolerate intense rehabilitation     Barriers to Discharge        Equipment Recommendations  None recommended by PT    Recommendations for Other Services OT consult  Frequency Min 3X/week   Progress towards PT Goals Progress towards PT goals: Progressing toward goals  Plan Discharge plan needs to be updated    Precautions / Restrictions Precautions Precautions: Fall Restrictions Weight Bearing Restrictions: No   Pertinent Vitals/Pain Sternal/throat area- 3/10     Mobility  Bed Mobility Bed Mobility: Supine to Sit;Sit to Supine Supine to Sit: 6: Modified independent (Device/Increase time);HOB elevated;With rails Sit to Supine: HOB elevated;With rail;6: Modified independent (Device/Increase time) Transfers Transfers: Sit to Stand;Stand to Sit Sit to Stand: 4: Min assist;From bed Stand to Sit: 4: Min assist;To bed Details for Transfer Assistance: assist to rise, stabilize, control descent Ambulation/Gait Ambulation/Gait Assistance: 4: Min assist Ambulation Distance (Feet): 125 Feet Assistive device: Rolling walker Ambulation/Gait Assistance Details: Dyspnea 2/4 with ambulation. Assist to stabilize. Fatigues fairly easily. Gait Pattern: Step-through pattern;Decreased stride length    Exercises     PT Diagnosis:    PT Problem List:   PT  Treatment Interventions:     PT Goals (current goals can now be found in the care plan section)    Visit Information  Last PT Received On: 11/07/12 Assistance Needed: +1 History of Present Illness: 79 y.o. female with h/o non-small cell lung cancer on radiation therapy, systolic CHF status post cardiac defibrillator placement who presented to [facility] ED 11/02/2012 with worsening abdominal pain, associated burning sensation in upper abdomen as well as nausea and vomiting. Patient had last radiation therapy one week prior to this admission.     Subjective Data      Cognition  Cognition Arousal/Alertness: Awake/alert Behavior During Therapy: WFL for tasks assessed/performed Overall Cognitive Status: Within Functional Limits for tasks assessed    Balance     End of Session PT - End of Session Activity Tolerance: Patient limited by fatigue Patient left: in bed;with call bell/phone within reach   GP     Rebeca Alert, MPT Pager: [phone]

## 2012-11-07 NOTE — Progress Notes (Addendum)
[facility] PROGRESS NOTE  Bentley Haralson ZOX:[id number] DOB: 25-Jul-1928 DOA: 11/02/2012 PCP: Thora Lance, MD  Brief narrative: 79 y.o. female with past medical history of non-small cell lung cancer on radiation therapy, systolic CHF status post cardiac defibrillator placement who presented to [facility] ED 11/02/2012 with worsening abdominal pain, associated burning sensation in upper abdomen as well as nausea and vomiting. Patient had last radiation therapy one week prior to this admission. No associated diarrhea or fevers.  In ED, vital signs were as follows: BP 84/43 which has recovered to 135/58 with IV fluids. Heart rate was 62 and temperature 97.6 F. patient was found to have urinalysis with moderate leukocyte esterases. Patient was started on Levaquin for possible urinary tract infection. Hospital course is being complicated due to severe burning sensation in upper abdomen, epigastric area. It was noted on EGD done 11/05/2012 that the patient has severe erosive esophagitis and per GI we intensified PPI therapy and added sucralfate.   Assessment/Plan:   Principal Problem:  Nausea & vomiting, abdominal pain in the setting of recently completed radiation therapy for lung cancer  - based on EGD, severe erosive esophagitis  - patient feels better today. - continue protonix 40 mg IV Q 12 hours and sucralfate - appreciate GI following; no further recommendations at this point so they have signed off 11/06/2012.  - continue fluconazole 400 mg IV daily  - increased morphine to 1 mg every 2 hours IV PRN which provided significant pain relief to patient; continue this analgesic regimen - continue supportive care with antiemetics  - Diet advanced as tolerated  Severe erosive esophagitis  - management as above with protonix, karfate and fluconazole Active Problems:  HYPOTENSION  - BP normalized - Continue Coreg 3.125 mg by mouth twice a day and losartan 12.5 mg daily  Lung cancer, lower lobe  -  Management per radiation oncology  UTI (urinary tract infection) secondary to Escherichia coli  - Urine culture sensitive to Levaquin, continued until 11/06/2012 which completed 5 days antibiotic regimen  Acute renal failure  - Likely related to urinary tract infection  - Creatinine is now within normal limits  Hypokalemia  - Secondary to GI losses  - Potassium within normal limits  Anemia of chronic disease  - secondary to history of malignancy  - hemoglobin stable   Code Status: DNR/DNI  Family Communication: no family at the bedside  Disposition Plan: patient prefers to go to short term rehab; PT ordered to evaluate for SNF placement  [name], MD  [facility]  Pager [phone]   Consultants:  Gastroenterology (Dr. Randa Evens) Procedures:  None  Antibiotics:  Levaquin 11/02/2012 --> 11/06/2012 Fluconazole 11/03/2012 -->   If 7PM-7AM, please contact night-coverage [email or web address] Password [email or web address] 11/07/2012, 1:26 PM   LOS: 5 days    HPI/Subjective: Feels better today.  Objective: Filed Vitals:   11/06/12 2200 11/07/12 0600 11/07/12 0810 11/07/12 1314  BP: 146/74 126/72  127/51  Pulse: 68 70  65  Temp: 99.1 F (37.3 C) 99.3 F (37.4 C)  98.4 F (36.9 C)  TempSrc: Oral Oral  Oral  Resp: 20 18  16   Height:      Weight:      SpO2: 94% 93% 95% 94%    Intake/Output Summary (Last 24 hours) at 11/07/12 1326 Last data filed at 11/07/12 1317  Gross per 24 hour  Intake    720 ml  Output      0 ml  Net    720 ml  Exam:   General:  Pt is alert, follows commands appropriately, not in acute distress  Cardiovascular: Regular rate and rhythm, S1/S2, no murmurs, no rubs, no gallops  Respiratory: Clear to auscultation bilaterally, no wheezing, no crackles, no rhonchi  Abdomen: Soft, non tender, non distended, bowel sounds present, no guarding  Extremities: No edema, pulses DP and PT palpable bilaterally  Neuro: Grossly nonfocal  Data Reviewed: Basic Metabolic  Panel:  Recent Labs Lab 11/02/12 1627 11/03/12 0525 11/04/12 0515 11/06/12 0550  NA 134* 137 141 140  K 4.1 3.1* 3.6 3.5  CL 91* 97 106 109  CO2 31 31 26 22   GLUCOSE 127* 97 101* 104*  BUN 19 15 12 9   CREATININE 1.31* 1.15* 1.08 0.95  CALCIUM 9.9 8.8 9.0 9.1   Liver Function Tests:  Recent Labs Lab 11/02/12 1627  AST 24  ALT 7  ALKPHOS 50  BILITOT 0.4  PROT 7.2  ALBUMIN 3.3*    Recent Labs Lab 11/02/12 1627 11/02/12 2150  LIPASE 13 12   No results found for this basename: AMMONIA,  in the last 168 hours CBC:  Recent Labs Lab 11/02/12 1627 11/03/12 0525 11/04/12 0515 11/06/12 0550  WBC 6.8 5.1 4.5 4.3  NEUTROABS 5.3  --   --   --   HGB 13.7 11.3* 11.6* 11.9*  HCT 41.1 34.1* 35.5* 36.8  MCV 91.5 91.9 93.2 92.7  PLT 238 204 233 209   Cardiac Enzymes:  Recent Labs Lab 11/02/12 2150  TROPONINI <0.30   BNP: No components found with this basename: POCBNP,  CBG: No results found for this basename: GLUCAP,  in the last 168 hours  Recent Results (from the past 240 hour(s))  URINE CULTURE     Status: None   Collection Time    11/02/12  6:29 PM      Result Value Range Status   Specimen Description URINE, RANDOM   Final   Special Requests NONE   Final   Culture  Setup Time 11/03/2012 01:19   Final   Colony Count >=100,000 COLONIES/ML   Final   Culture ESCHERICHIA COLI   Final   Report Status 11/04/2012 FINAL   Final   Organism ID, Bacteria ESCHERICHIA COLI   Final     Studies: No results found.  Scheduled Meds: . carvedilol  3.125 mg Oral BID WC  . enoxaparin (LOVENOX)   40 mg Subcutaneous Q24H  . feeding supplement  1 Container Oral TID BM  . fluconazole (DIFLUCAN)  400 mg Intravenous Q24H  . formoterol  12 mcg Inhalation BID  . gabapentin  300 mg Oral BID  . losartan  12.5 mg Oral Daily  . multivitamin  5 mL Oral Daily  . pantoprazole  40 mg Intravenous Q12H  . sucralfate  1 g Oral TID WC & HS  . zolpidem  5 mg Oral QHS

## 2012-11-08 ENCOUNTER — Telehealth: Payer: Self-pay | Admitting: Dietician

## 2012-11-08 ENCOUNTER — Encounter (HOSPITAL_COMMUNITY): Payer: Self-pay | Admitting: Gastroenterology

## 2012-11-08 MED ORDER — MORPHINE SULFATE 20 MG/5ML PO SOLN: 2.5000 mg | ORAL | Status: DC | PRN

## 2012-11-08 MED ORDER — SUCRALFATE 1 GM/10ML PO SUSP: 1.0000 g | Freq: Three times a day (TID) | ORAL | Status: DC

## 2012-11-08 MED ORDER — ZOLPIDEM TARTRATE 5 MG PO TABS: 5.0000 mg | ORAL_TABLET | Freq: Every day | ORAL | Status: DC

## 2012-11-08 MED ORDER — HYDROCODONE-ACETAMINOPHEN 10-325 MG PO TABS: 1.0000 | ORAL_TABLET | ORAL | Status: DC | PRN

## 2012-11-08 MED ORDER — BOOST / RESOURCE BREEZE PO LIQD: 1.0000 | Freq: Three times a day (TID) | ORAL | Status: DC

## 2012-11-08 MED ORDER — POTASSIUM CHLORIDE ER 20 MEQ PO TBCR: 20.0000 meq | EXTENDED_RELEASE_TABLET | Freq: Every day | ORAL | Status: DC

## 2012-11-08 MED ORDER — ALBUTEROL SULFATE (5 MG/ML) 0.5% IN NEBU: 2.5000 mg | INHALATION_SOLUTION | RESPIRATORY_TRACT | Status: DC | PRN

## 2012-11-08 MED ORDER — FLUCONAZOLE 10 MG/ML PO SUSR: 200.0000 mg | Freq: Every day | ORAL | Status: AC

## 2012-11-08 MED ORDER — PANTOPRAZOLE SODIUM 40 MG PO TBEC: 40.0000 mg | DELAYED_RELEASE_TABLET | Freq: Two times a day (BID) | ORAL | Status: AC

## 2012-11-08 MED ORDER — ADULT MULTIVITAMIN LIQUID CH: 5.0000 mL | Freq: Every day | ORAL | Status: DC

## 2012-11-08 MED ORDER — DIAZEPAM 5 MG PO TABS: 5.0000 mg | ORAL_TABLET | Freq: Four times a day (QID) | ORAL | Status: DC | PRN

## 2012-11-08 NOTE — Progress Notes (Signed)
Clinical Social Work Department BRIEF PSYCHOSOCIAL ASSESSMENT 11/08/2012  Patient:  Heather Bullock, Heather Bullock     Account Number:  [id number]     Admit date:  11/02/2012  Clinical Social Worker:  Dennison Bulla  Date/Time:  11/08/2012 10:30 AM  Referred by:  Physician  Date Referred:  11/08/2012 Referred for  SNF Placement   Other Referral:   Interview type:  Patient Other interview type:    PSYCHOSOCIAL DATA Living Status:  ALONE Admitted from facility:   Level of care:   Primary support name:  Renae Fickle Primary support relationship to patient:  CHILD, ADULT Degree of support available:   Strong    CURRENT CONCERNS Current Concerns  Post-Acute Placement   Other Concerns:    SOCIAL WORK ASSESSMENT / PLAN CSW received referral due to assist with DC planning. CSW reviewed chart and met with patient at bedside.    CSW introduced myself and explained role. Patient reports she feels deconditioned and wants to go to ST SNF at DC. CSW explained SNF process and the need for insurance authorization. Patient reviewed list and is interested in [facility]. [facility] agreeable to accept patient today.    [facility] Medicare authorized SNF stay [id number]. CSW faxed DC summary to [facility] and coordinated transportation via [facility]. Request # [id number].    CSW informed patient and RN of DC plans. Patient reports she has informed her family. CSW is signing off but available if needed.   Assessment/plan status:  No Further Intervention Required Other assessment/ plan:   Information/referral to community resources:   SNF information    PATIENT'S/FAMILY'S RESPONSE TO PLAN OF CARE: Patient alert and oriented. Patient thanked CSW for time and reports she is happy that she will be going to her first choice in SNF. Patient reports that family is aware and agreeable to plans as well.

## 2012-11-08 NOTE — Progress Notes (Signed)
Clinical Social Work Department CLINICAL SOCIAL WORK PLACEMENT NOTE 11/08/2012  Patient:  Heather Bullock, Heather Bullock  Account Number:  [id number] Admit date:  11/02/2012  Clinical Social Worker:  Unk Lightning, LCSW  Date/time:  11/08/2012 02:00 PM  Clinical Social Work is seeking post-discharge placement for this patient at the following level of care:   SKILLED NURSING   (*CSW will update this form in Epic as items are completed)   11/08/2012  Patient/family provided with [facility] Department of Clinical Social Work's list of facilities offering this level of care within the geographic area requested by the patient (or if unable, by the patient's family).  11/08/2012  Patient/family informed of their freedom to choose among providers that offer the needed level of care, that participate in Medicare, Medicaid or managed care program needed by the patient, have an available bed and are willing to accept the patient.  11/08/2012  Patient/family informed of [facility]' ownership interest in [facility], as well as of the fact that they are under no obligation to receive care at this facility.  PASARR submitted to EDS on existing # PASARR number received from EDS on   FL2 transmitted to all facilities in geographic area requested by pt/family on  11/08/2012 FL2 transmitted to all facilities within larger geographic area on   Patient informed that his/her managed care company has contracts with or will negotiate with  certain facilities, including the following:     Patient/family informed of bed offers received:  11/08/2012 Patient chooses bed at [facility] Physician recommends and patient chooses bed at    Patient to be transferred to [facility] on  11/08/2012 Patient to be transferred to facility by [facility]  The following physician request were entered in Epic:   Additional Comments:

## 2012-11-08 NOTE — Discharge Summary (Signed)
Physician Discharge Summary  Heather Bullock ZHY:[id number] DOB: October 02, 1928 DOA: 11/02/2012  PCP: Thora Lance, MD  Admit date: 11/02/2012 Discharge date: 11/08/2012  Recommendations for Outpatient Follow-up:  1. Please note that you have been hospitalized for radiation esophagitis. It may take some time before you start experiencing significant pain relief. We have provided pain medications in the form of suspension or pill for easier swallowing.   2. You will continue taking fluconazole 200 mg daily suspension for 2 weeks on discharge. In addition you will continue taking nystatin swish and swallow mouthwash and Magic mouthwash with lidocaine for symptoms of pain on swallowing. Continue taking sucralfate as prescribed.  3. Please note that we have temporarily stop your aspirin as it may further exacerbate esophageal ulcerations identified on upper endoscopy on this admission. Once you start feeling better in terms of esophagitis and burning sensation you may restart aspirin.  4. You may continue taking Lasix 40 mg twice daily in addition to potassium supplement 40 mg daily  5. You may continue taking blood pressure medications including the coreg and losartan  6. followup with primary care physician in about one week from the time of discharge to make sure that your symptoms are improving.  7. Your complete blood work has been stable throughout the hospital stay. You can recheck it again on her next followup with primary care provider.  Discharge Diagnoses:  Principal Problem:   Nausea & vomiting Active Problems:   Abdominal pain   Radiation esophagitis   SYSTOLIC HEART FAILURE, CHRONIC   HYPOTENSION   Hypertension   Lung cancer, lower lobe   UTI (urinary tract infection)   Hypokalemia   Anemia of chronic disease   Acute renal failure    Discharge Condition: Medically stable for discharge to skilled nursing facility today  Diet recommendation: As tolerated  History of present  illness:  79 y.o. female with past medical history of non-small cell lung cancer on radiation therapy, systolic CHF status post cardiac defibrillator placement who presented to [facility] ED 11/02/2012 with worsening abdominal pain, associated burning sensation in upper abdomen as well as nausea and vomiting. Patient had last radiation therapy one week prior to this admission. No associated diarrhea or fevers.  In ED, vital signs were as follows: BP 84/43 which has recovered to 135/58 with IV fluids. Heart rate was 62 and temperature 97.6 F. patient was found to have urinalysis with moderate leukocyte esterases. Patient was started on Levaquin for possible urinary tract infection. Hospital course is being complicated due to severe burning sensation in upper abdomen, epigastric area. It was noted on EGD done 11/05/2012 that the patient has severe erosive esophagitis and per GI we intensified PPI therapy and added sucralfate.   Assessment/Plan:   Principal Problem:  Nausea & vomiting, abdominal pain in the setting of recently completed radiation therapy for lung cancer  - based on EGD, severe erosive esophagitis  - patient feels better today.  - continue protonix 40 mg every 12 hours and sucralfate - appreciate GI following; no further recommendations at this point so they have signed off 11/06/2012.  - continue fluconazole 200 mg daily suspension for 2 weeks and discharge - Skilled nursing facility does not allow IV morphine so we will switch to morphine suspension for pain control. We also provided prescription for Norco for breakthrough pain. - Continue with lidocaine Magic mouthwash as well as nystatin swish and swallow - Diet advanced as tolerated  Severe erosive esophagitis  - management as above with protonix,  karfate and fluconazole  Active Problems:  HYPOTENSION  - BP normalized  - Continue Coreg 3.125 mg by mouth twice a day and losartan 12.5 mg daily  Lung cancer, lower lobe  - Management per  radiation oncology  UTI (urinary tract infection) secondary to Escherichia coli  - Urine culture sensitive to Levaquin, continued until 11/06/2012 which completed 5 days antibiotic regimen  Acute renal failure  - Likely related to urinary tract infection  - Creatinine is now within normal limits  Hypokalemia  - Secondary to GI losses  - Potassium within normal limits  Anemia of chronic disease  - secondary to history of malignancy  - hemoglobin stable   Code Status: DNR/DNI  Family Communication: no family at the bedside    [name], MD  [facility]  Pager [phone]   Consultants:  Gastroenterology (Dr. Randa Evens) Procedures:  None  Antibiotics:  Levaquin 11/02/2012 --> 11/06/2012  Fluconazole 11/03/2012 --> for 2 weeks on discahrge    Discharge Exam: Filed Vitals:   11/08/12 0757  BP: 143/64  Pulse: 67  Temp:   Resp:    Filed Vitals:   11/07/12 2128 11/08/12 0507 11/08/12 0757 11/08/12 0826  BP: 136/61 142/62 143/64   Pulse: 64 71 67   Temp: 99.1 F (37.3 C) 98.8 F (37.1 C)    TempSrc: Oral Oral    Resp: 18 18    Height:      Weight:      SpO2: 96% 95%  95%    General: Pt is alert, follows commands appropriately, not in acute distress Cardiovascular: Regular rate and rhythm, S1/S2 +, no murmurs, no rubs, no gallops Respiratory: Clear to auscultation bilaterally, no wheezing, no crackles, no rhonchi Abdominal: Soft, non tender, non distended, bowel sounds +, no guarding Extremities: no edema, no cyanosis, pulses palpable bilaterally DP and PT Neuro: Grossly nonfocal  Discharge Instructions  Discharge Orders   Future Appointments Provider Department Dept Phone   12/01/2012 11:00 AM [facility] [place]  [facility]AGING [phone]   Patient should wear two piece clothing and wear no powder or deodorant. Patient should arrive 15 minutes early.   12/20/2012 11:30 AM Barbaraann Share, MD [facility] [phone]   12/28/2012 11:00 AM Duke Salvia,  MD [facility] Main Office [place]) [phone]   Future Orders Complete By Expires     Call MD for:  difficulty breathing, headache or visual disturbances  As directed     Call MD for:  persistant dizziness or light-headedness  As directed     Call MD for:  persistant nausea and vomiting  As directed     Call MD for:  severe uncontrolled pain  As directed     Diet - low sodium heart healthy  As directed     Discharge instructions  As directed     Comments:      1. Please note that you have been hospitalized for radiation esophagitis. It may take some time before you start experiencing significant pain relief. We have provided pain medications in the form of suspension or pill for easier swallowing.  2. You will continue taking fluconazole 200 mg daily suspension for 2 weeks on discharge. In addition you will continue taking nystatin swish and swallow mouthwash and Magic mouthwash with lidocaine for symptoms of pain on swallowing. Continue taking sucralfate as prescribed. 3. Please note that we have temporarily stop your aspirin as it may further exacerbate esophageal ulcerations identified on upper endoscopy on this  admission. Once you start feeling better in terms of esophagitis and burning sensation you may restart aspirin. 4. You may continue taking Lasix 40 mg twice daily in addition to potassium supplement 40 mg daily 5. You may continue taking blood pressure medications including the coreg and losartan 6. followup with primary care physician in about one week from the time of discharge to make sure that your symptoms are improving. 7. Your complete blood work has been stable throughout the hospital stay. You can recheck it again on her next followup with primary care provider.    Increase activity slowly  As directed         Medication List    STOP taking these medications       aspirin 81 MG tablet     omeprazole 20 MG capsule  Commonly known as:  PRILOSEC     potassium  chloride 10 MEQ CR capsule  Commonly known as:  MICRO-K  Replaced by:  Potassium Chloride ER 20 MEQ Tbcr      TAKE these medications       albuterol (5 MG/ML) 0.5% nebulizer solution  Commonly known as:  PROVENTIL  Take 0.5 mLs (2.5 mg total) by nebulization every 4 (four) hours as needed for wheezing or shortness of breath.     CALCIUM CITRATE + D PO  Take by mouth daily.     carvedilol 3.125 MG tablet  Commonly known as:  COREG  Take 1 tablet (3.125 mg total) by mouth 2 (two) times daily with a meal.     diazepam 5 MG tablet  Commonly known as:  VALIUM  Take 1 tablet (5 mg total) by mouth every 6 (six) hours as needed.     diclofenac 1.3 % Ptch  Commonly known as:  FLECTOR  Place 1 patch onto the skin 2 (two) times daily.     docusate sodium 100 MG capsule  Commonly known as:  COLACE  daily.     feeding supplement Liqd  Take 1 Container by mouth 3 (three) times daily between meals.     fluconazole 10 MG/ML suspension  Commonly known as:  DIFLUCAN  Take 20 mLs (200 mg total) by mouth daily.     formoterol 12 MCG capsule for inhaler  Commonly known as:  FORADIL AEROLIZER  Place 1 capsule (12 mcg total) into inhaler and inhale 2 (two) times daily.     furosemide 40 MG tablet  Commonly known as:  LASIX  Take 2 pills (80 mg) two times per day     gabapentin 300 MG capsule  Commonly known as:  NEURONTIN  Take 300 mg by mouth 2 (two) times daily.     hyaluronate sodium Gel  Apply 1 application topically 2 (two) times daily.     HYDROcodone-acetaminophen 10-325 MG per tablet  Commonly known as:  NORCO  Take 1 tablet by mouth every 4 (four) hours as needed for pain.     losartan 25 MG tablet  Commonly known as:  COZAAR  Take 0.5 tablets (12.5 mg total) by mouth 2 (two) times daily.     magic mouthwash w/lidocaine Soln  1part nystatin,1part Maaloxplus,1part benadryl,3part 2%viscous lidocaine. Swallow 10 mL up to QID, before meals/bedtime.  Called in [place]  on [address] for refill on 10/15/2012 with 3 refills.     magnesium hydroxide 400 MG/5ML suspension  Commonly known as:  MILK OF MAGNESIA  Take 5 mLs by mouth daily as needed for constipation.  morphine 20 MG/5ML solution  Take 0.6 mLs (2.4 mg total) by mouth every 2 (two) hours as needed for pain.     multivitamin capsule  Take 1 capsule by mouth daily.     multivitamin Liqd  Take 5 mLs by mouth daily.     NON FORMULARY  - Mega Red  - daily     nystatin 100000 UNIT/ML suspension  Commonly known as:  MYCOSTATIN  Take 5 mLs (500,000 Units total) by mouth 4 (four) times daily.     ondansetron 8 MG tablet  Commonly known as:  ZOFRAN  Take 1 tablet (8 mg total) by mouth every 8 (eight) hours as needed for nausea.     OSTEO BI-FLEX REGULAR STRENGTH PO  Take 2 tablets by mouth daily.     oxybutynin 5 MG tablet  Commonly known as:  DITROPAN  Take 5 mg by mouth daily.     pantoprazole 40 MG tablet  Commonly known as:  PROTONIX  Take 1 tablet (40 mg total) by mouth 2 (two) times daily.     polyethylene glycol powder powder  Commonly known as:  MIRALAX  Take 17 g by mouth daily.     Potassium Chloride ER 20 MEQ Tbcr  Take 20 mEq by mouth daily.     pravastatin 40 MG tablet  Commonly known as:  PRAVACHOL  Take 40 mg by mouth daily.     sucralfate 1 GM/10ML suspension  Commonly known as:  CARAFATE  Take 10 mLs (1 g total) by mouth 4 (four) times daily -  with meals and at bedtime.     triamcinolone cream 0.1 %  Commonly known as:  KENALOG  As needed     TYLENOL EXTRA STRENGTH PO  Take 650 mg by mouth as needed.     VEGETABLE LAXATIVE PO  as needed.     vitamin C 500 MG tablet  Commonly known as:  ASCORBIC ACID  Take 1,000 mg by mouth daily.     Vitamin D (Ergocalciferol) 50000 UNITS Caps  Commonly known as:  DRISDOL  Take 50,000 Units by mouth every 7 (seven) days.     zolpidem 5 MG tablet  Commonly known as:  AMBIEN  Take 1 tablet (5 mg total) by  mouth at bedtime.           Follow-up Information   Follow up with Thora Lance, MD In 1 week.   Contact information:   [address] [place] [address] [phone]        The results of significant diagnostics from this hospitalization (including imaging, microbiology, ancillary and laboratory) are listed below for reference.    Significant Diagnostic Studies: Ct Abdomen Pelvis W Contrast  11/02/2012   *RADIOLOGY REPORT*  Clinical Data: Epigastric pain, nausea, vomiting.  CT ABDOMEN AND PELVIS WITH CONTRAST  Technique:  Multidetector CT imaging of the abdomen and pelvis was performed following the standard protocol during bolus administration of intravenous contrast.  Contrast: OMNIPAQUE IOHEXOL 300 MG/ML  SOLN,  Comparison: CT 08/22/2003  Findings: Left lower lobe airspace disease and small left pleural effusion partially imaged.  Minimal atelectasis medially and posteriorly in the right lung base.  The heart is mildly enlarged. Pacer wires noted.  Liver, spleen, adrenals and kidneys grossly unremarkable.  There is mild distention of the gallbladder.  No visible stones, wall thickening or pericholecystic fluid.  Diffuse atrophy of the pancreas without focal abnormality.  Sigmoid diverticulosis.  No active diverticulitis.  Small  bowel is decompressed as is the stomach, grossly unremarkable.  Urinary bladder unremarkable.  Prior hysterectomy.  No adnexal masses.  Aorta and iliac vessels are calcified, non-aneurysmal.  Degenerative disc and facet disease throughout the lumbar spine. No acute findings.  IMPRESSION: Left lower lobe airspace disease with small left effusion.  Cannot exclude pneumonia.  Cardiomegaly.  Sigmoid diverticulosis.   Original Report Authenticated By: Charlett Nose, M.D.   Dg Abd Acute W/chest  11/02/2012   *RADIOLOGY REPORT*  Clinical Data: Nausea, vomiting and abdominal pain  ACUTE ABDOMEN SERIES (ABDOMEN 2 VIEW & CHEST 1 VIEW)  Comparison: 08/12/2012   Findings: There is a left chest wall AICD with leads in the right atrial appendage, coronary sinus and right ventricle.  The heart size appears mildly enlarged.  A small left pleural effusion is identified.  This is similar to previous exam.  Air-filled loops of large bowel are identified.  No abnormal small bowel dilatation identified.  On the decubitus radiographs there is no evidence for free air or air-fluid levels.  IMPRESSION:  1.  Nonobstructive bowel gas pattern. 2.  Left pleural effusion.   Original Report Authenticated By: Signa Kell, M.D.    Microbiology: Recent Results (from the past 240 hour(s))  URINE CULTURE     Status: None   Collection Time    11/02/12  6:29 PM      Result Value Range Status   Specimen Description URINE, RANDOM   Final   Special Requests NONE   Final   Culture  Setup Time 11/03/2012 01:19   Final   Colony Count >=100,000 COLONIES/ML   Final   Culture ESCHERICHIA COLI   Final   Report Status 11/04/2012 FINAL   Final   Organism ID, Bacteria ESCHERICHIA COLI   Final     Labs: Basic Metabolic Panel:  Recent Labs Lab 11/02/12 1627 11/03/12 0525 11/04/12 0515 11/06/12 0550  NA 134* 137 141 140  K 4.1 3.1* 3.6 3.5  CL 91* 97 106 109  CO2 31 31 26 22   GLUCOSE 127* 97 101* 104*  BUN 19 15 12 9   CREATININE 1.31* 1.15* 1.08 0.95  CALCIUM 9.9 8.8 9.0 9.1   Liver Function Tests:  Recent Labs Lab 11/02/12 1627  AST 24  ALT 7  ALKPHOS 50  BILITOT 0.4  PROT 7.2  ALBUMIN 3.3*    Recent Labs Lab 11/02/12 1627 11/02/12 2150  LIPASE 13 12   No results found for this basename: AMMONIA,  in the last 168 hours CBC:  Recent Labs Lab 11/02/12 1627 11/03/12 0525 11/04/12 0515 11/06/12 0550  WBC 6.8 5.1 4.5 4.3  NEUTROABS 5.3  --   --   --   HGB 13.7 11.3* 11.6* 11.9*  HCT 41.1 34.1* 35.5* 36.8  MCV 91.5 91.9 93.2 92.7  PLT 238 204 233 209   Cardiac Enzymes:  Recent Labs Lab 11/02/12 2150  TROPONINI <0.30   BNP: BNP (last 3  results)  Recent Labs  12/26/11 1043 11/02/12 1627  PROBNP 63.0 325.7   CBG: No results found for this basename: GLUCAP,  in the last 168 hours  Time coordinating discharge: Over 30 minutes  Signed:  [name], MD  [facility]  11/08/2012, 11:14 AM  Pager #: [phone]

## 2012-11-08 NOTE — Progress Notes (Signed)
Called report to Elsie Lincoln ADON at [facility] states to leave patient IV in for transport since hard stick and they will remove when ready.

## 2012-11-10 ENCOUNTER — Encounter: Payer: Self-pay | Admitting: Internal Medicine

## 2012-11-10 ENCOUNTER — Non-Acute Institutional Stay (SKILLED_NURSING_FACILITY): Payer: Medicare Other | Admitting: Internal Medicine

## 2012-11-10 DIAGNOSIS — I5022 Chronic systolic (congestive) heart failure: Secondary | ICD-10-CM

## 2012-11-10 DIAGNOSIS — E876 Hypokalemia: Secondary | ICD-10-CM

## 2012-11-10 DIAGNOSIS — C3430 Malignant neoplasm of lower lobe, unspecified bronchus or lung: Secondary | ICD-10-CM

## 2012-11-10 DIAGNOSIS — K208 Other esophagitis without bleeding: Secondary | ICD-10-CM

## 2012-11-10 DIAGNOSIS — I959 Hypotension, unspecified: Secondary | ICD-10-CM

## 2012-11-10 DIAGNOSIS — D638 Anemia in other chronic diseases classified elsewhere: Secondary | ICD-10-CM

## 2012-11-10 DIAGNOSIS — Z95810 Presence of automatic (implantable) cardiac defibrillator: Secondary | ICD-10-CM

## 2012-11-10 NOTE — Progress Notes (Signed)
Date: 11/10/2012  MRN:  [id number] Name:  Heather Bullock Sex:  female Age:  79 y.o. DOB:[birth year]   PSC #:                       Facility/Room;[facility] Level Of Care: SNF Provider: [facility]  Emergency Contacts: Contact Information   Name Relation Home Work [place] Son [phone]   Vernia Buff [phone]  [phone]      Code Status:DNR   Allergies: Allergies  Allergen Reactions  . Bacitracin   . Imuran (Azathioprine)   . Metoclopramide Hcl     Reglan  . Nabumetone     Relafen  . Neomycin-Bacitracin Zn-Polymyx   . Nitroglycerin   . Nsaids   . Nutritional Supplements   . Other     No blood products - pt is of [employer]  . Plaquenil (Hydroxychloroquine Sulfate)   . Tizanidine     Lowers blood pressure     Chief Complaint  Patient presents with  . Hospitalization Follow-up    admission Hx and PE     HPI: Pt is 79 yo female with hx non-small cell CA lung just hospitalized for n/v and abd pain sec to radiation esophagitis -last XRT on 7/1- being admitted to SNF  Past Medical History  Diagnosis Date  . LBBB (left bundle branch block)   . Nonischemic cardiomyopathy     EF 25-30% echo 2010, 45% echo 2012  . Hypertension   . Diverticulitis   . ICD (implantable cardiac defibrillator) in place     CRT-D [employer]  . Chronic fatigue   . Fibromyalgia   . Shortness of breath     followed by Dr. Shelle Iron  . Elevated uric acid   . CRI (chronic renal insufficiency)   . Abdominal bloating   . Osteopenia   . CHF (congestive heart failure)   . Arthritis   . COPD (chronic obstructive pulmonary disease)   . Asthma   . Non-small cell lung cancer     Past Surgical History  Procedure Laterality Date  . Crt-d-st. jude's    . Replacement total knee    . Abdominal hysterectomy    . Appendectomy    . Neck surgery    . Tonsillectomy    . Neck surgery  02/2007    cervical  . Laser surgery left eye   2013  . Oral surgery/infection in root of teeth  2013  . Video bronchoscopy Bilateral 08/12/2012    Procedure: VIDEO BRONCHOSCOPY WITHOUT FLUORO;  Surgeon: Barbaraann Share, MD;  Location: [facility] ENDOSCOPY;  Service: Cardiopulmonary;  Laterality: Bilateral;  . Esophagogastroduodenoscopy N/A 11/05/2012    Procedure: ESOPHAGOGASTRODUODENOSCOPY (EGD);  Surgeon: Vertell Novak., MD;  Location: [facility];  Service: Endoscopy;  Laterality: N/A;     Procedures:EGD 7/11 in hosp-severe erosive esophagitis Consultants: GI in [place]- have signed off  Current Outpatient Prescriptions  Medication Sig Dispense Refill  . Acetaminophen (TYLENOL EXTRA STRENGTH PO) Take 650 mg by mouth as needed.      Marland Kitchen albuterol (PROVENTIL) (5 MG/ML) 0.5% nebulizer solution Take 0.5 mLs (2.5 mg total) by nebulization every 4 (four) hours as needed for wheezing or shortness of breath.  20 mL  12  . Alum & Mag Hydroxide-Simeth (MAGIC MOUTHWASH W/LIDOCAINE) SOLN 1part nystatin,1part Maaloxplus,1part benadryl,3part 2%viscous lidocaine. Swallow 10 mL up to QID, before meals/bedtime.  Called in [facility] [address] for refill on 10/15/2012 with 3 refills.      Marland Kitchen  Ascorbic Acid (VITAMIN C) 500 MG tablet Take 1,000 mg by mouth daily.       . Calcium Citrate-Vitamin D (CALCIUM CITRATE + D PO) Take by mouth daily.       . carvedilol (COREG) 3.125 MG tablet Take 1 tablet (3.125 mg total) by mouth 2 (two) times daily with a meal.  180 tablet  6  . diazepam (VALIUM) 5 MG tablet Take 1 tablet (5 mg total) by mouth every 6 (six) hours as needed.  30 tablet  0  . diclofenac (FLECTOR) 1.3 % PTCH Place 1 patch onto the skin 2 (two) times daily.      Marland Kitchen docusate sodium (COLACE) 100 MG capsule daily.       . feeding supplement (RESOURCE BREEZE) LIQD Take 1 Container by mouth 3 (three) times daily between meals.  1 Container  0  . fluconazole (DIFLUCAN) 10 MG/ML suspension Take 20 mLs (200 mg total) by mouth daily.  35 mL  3  . formoterol  (FORADIL AEROLIZER) 12 MCG capsule for inhaler Place 1 capsule (12 mcg total) into inhaler and inhale 2 (two) times daily.  60 capsule  6  . furosemide (LASIX) 40 MG tablet Take 2 pills (80 mg) two times per day  90 tablet  3  . gabapentin (NEURONTIN) 300 MG capsule Take 300 mg by mouth 2 (two) times daily.       . Glucosamine-Chondroitin (OSTEO BI-FLEX REGULAR STRENGTH PO) Take 2 tablets by mouth daily.        . hyaluronate sodium (RADIAPLEXRX) GEL Apply 1 application topically 2 (two) times daily.      Marland Kitchen HYDROcodone-acetaminophen (NORCO) 10-325 MG per tablet Take 1 tablet by mouth every 4 (four) hours as needed for pain.  45 tablet  0  . losartan (COZAAR) 25 MG tablet Take 0.5 tablets (12.5 mg total) by mouth 2 (two) times daily.  90 tablet  3  . magnesium hydroxide (MILK OF MAGNESIA) 400 MG/5ML suspension Take 5 mLs by mouth daily as needed for constipation.      Marland Kitchen morphine 20 MG/5ML solution Take 0.6 mLs (2.4 mg total) by mouth every 2 (two) hours as needed for pain.  100 mL  0  . Multiple Vitamin (MULTIVITAMIN) capsule Take 1 capsule by mouth daily.        . Multiple Vitamin (MULTIVITAMIN) LIQD Take 5 mLs by mouth daily.  1 Bottle  0  . NON FORMULARY Mega Red daily       . nystatin (MYCOSTATIN) 100000 UNIT/ML suspension Take 5 mLs (500,000 Units total) by mouth 4 (four) times daily.  60 mL  0  . ondansetron (ZOFRAN) 8 MG tablet Take 1 tablet (8 mg total) by mouth every 8 (eight) hours as needed for nausea.  20 tablet  0  . oxybutynin (DITROPAN) 5 MG tablet Take 5 mg by mouth daily.        . pantoprazole (PROTONIX) 40 MG tablet Take 1 tablet (40 mg total) by mouth 2 (two) times daily.  60 tablet  3  . polyethylene glycol powder (MIRALAX) powder Take 17 g by mouth daily.  255 g  5  . potassium chloride 20 MEQ TBCR Take 20 mEq by mouth daily.  30 tablet  0  . pravastatin (PRAVACHOL) 40 MG tablet Take 40 mg by mouth daily.        . Psyllium (VEGETABLE LAXATIVE PO) as needed.        . sucralfate  (CARAFATE) 1 GM/10ML suspension Take  10 mLs (1 g total) by mouth 4 (four) times daily -  with meals and at bedtime.  420 mL  0  . triamcinolone cream (KENALOG) 0.1 % As needed      . Vitamin D, Ergocalciferol, (DRISDOL) 50000 UNITS CAPS Take 50,000 Units by mouth every 7 (seven) days.        Marland Kitchen zolpidem (AMBIEN) 5 MG tablet Take 1 tablet (5 mg total) by mouth at bedtime.  30 tablet  0    Immunization History  Administered Date(s) Administered  . Influenza Split 01/22/2012  . Influenza Whole 01/26/2009, 02/08/2010, 01/27/2011  . Pneumococcal Polysaccharide 04/28/2006     Diet: Pureed with magic cup daily and med pass 120 ml BID  History  Substance Use Topics  . Smoking status: Never Smoker   . Smokeless tobacco: Never Used  . Alcohol Use: No    Family History  Problem Relation Age of Onset  . Heart disease Son   . Colon cancer Sister   . Lung cancer Father   . Heart disease Mother   . Heart disease Brother   . Hypertension Mother   . Diabetes Brother      DATA OBTAINED: from patient GENERAL: Feels well but fatigued;abd pain is present but much better and can eat more  No fevers SKIN: No itch, rash or open wounds EYES: No eye pain, dryness or itching  No change in vision EARS: No earache, tinnitus, change in hearing NOSE: No congestion, drainage or bleeding MOUTH/THROAT: No mouth or tooth pain  No sore throat No difficulty chewing or swallowing RESPIRATORY: No cough, wheezing,feels a little SOB today, this is not unusual for her, but no coughing or sx of infection CARDIAC: No chest pain, palpitations  No edema. CHEST/BREASTS: No discomfort, discharge or lumps in breasts GI: Mild epigastric burning still but much improved  No N/V/D; was constipated but had very loose stool today as result of constipation meds  GU: No dysuria, frequency or urgency  No change in urine volume or character e in stream   MUSCULOSKELETAL:chronic diffuse joint pain from fibromyalgia at bASELINEs,   Gait is steady  No recent falls.  NEUROLOGIC: No dizziness, fainting, headache, imbalance, numbness  No change in mental status.  PSYCHIATRIC: No feelings of anxiety, depression Sleeps well.  No behavior issue.    Vital signs: BP 94/56  Pulse 70  Temp(Src) 97.8 F (36.6 C)  Resp 20  SpO2 95%   GENERAL APPEARANCE: No acute distress, appropriately groomed, normal body habitus. Alert, pleasant, conversant. SKIN: No diaphoresis rash, unusual lesions, wounds HEAD: Normocephalic, atraumatic EYES: Conjunctiva/lids clear. Pupils round, reactive. EOMs intact.  EARS: External exam WNL, canals clear, TM WNL. Hearing grossly normal. NOSE: No deformity or discharge. MOUTH/THROAT: Lips w/o lesions. Oral mucosa, tongue moist, w/o lesion. Oropharynx w/o redness or lesions.  NECK: Supple, full ROM. No thyroid tenderness, enlargement or nodule RESPIRATORY: Breathing is even, unlabored. Lung sounds are clear and full.  CARDIOVASCULAR: Heart RRR. No murmur or extra heart sounds  ARTERIAL:  DP,PT pulse 2+.  VENOUS: No varicosities. No venous stasis skin changes  EDEMA: No peripheral or periorbital edema. No ascites GASTROINTESTINAL: Abdomen is soft, moderate TTP epigastric,no guard, no reboiund, not distended w/ normal bowel sounds. No hepatic or splenic enlargement. No mass, ventral or inguinal hernia. No anal fissure, skin tag or external hemorrhoid. Sphincter tone WNL. Stool is brown, heme negative. GENITOURINARY: Bladder non tender, not distended. MUSCULOSKELETAL: Moves all extremities with full ROM, strength and tone. Back  is without kyphosis, scoliosis or spinal process tenderness NEUROLOGIC: Oriented to time, place, person. Cranial nerves 2-12 grossly intact, speech clear, no tremor.Marland Kitchen PSYCHIATRIC: Mood and affect appropriate to situation                      Admission on 11/02/2012, Discharged on 11/08/2012  Component Date Value Range Status  . WBC 11/02/2012 6.8  4.0 - 10.5 K/uL Final   . RBC 11/02/2012 4.49  3.87 - 5.11 MIL/uL Final  . Hemoglobin 11/02/2012 13.7  12.0 - 15.0 g/dL Final  . HCT 16/01/9603 41.1  36.0 - 46.0 % Final  . MCV 11/02/2012 91.5  78.0 - 100.0 fL Final  . MCH 11/02/2012 30.5  26.0 - 34.0 pg Final  . MCHC 11/02/2012 33.3  30.0 - 36.0 g/dL Final  . RDW 54/12/8117 14.7  11.5 - 15.5 % Final  . Platelets 11/02/2012 238  150 - 400 K/uL Final  . Neutrophils Relative % 11/02/2012 78* 43 - 77 % Final  . Neutro Abs 11/02/2012 5.3  1.7 - 7.7 K/uL Final  . Lymphocytes Relative 11/02/2012 10* 12 - 46 % Final  . Lymphs Abs 11/02/2012 0.7  0.7 - 4.0 K/uL Final  . Monocytes Relative 11/02/2012 11  3 - 12 % Final  . Monocytes Absolute 11/02/2012 0.8  0.1 - 1.0 K/uL Final  . Eosinophils Relative 11/02/2012 1  0 - 5 % Final  . Eosinophils Absolute 11/02/2012 0.0  0.0 - 0.7 K/uL Final  . Basophils Relative 11/02/2012 0  0 - 1 % Final  . Basophils Absolute 11/02/2012 0.0  0.0 - 0.1 K/uL Final  . Sodium 11/02/2012 134* 135 - 145 mEq/L Final  . Potassium 11/02/2012 4.1  3.5 - 5.1 mEq/L Final  . Chloride 11/02/2012 91* 96 - 112 mEq/L Final  . CO2 11/02/2012 31  19 - 32 mEq/L Final  . Glucose, Bld 11/02/2012 127* 70 - 99 mg/dL Final  . BUN 14/78/2956 19  6 - 23 mg/dL Final  . Creatinine, Ser 11/02/2012 1.31* 0.50 - 1.10 mg/dL Final  . Calcium 21/30/8657 9.9  8.4 - 10.5 mg/dL Final  . Total Protein 11/02/2012 7.2  6.0 - 8.3 g/dL Final  . Albumin 84/69/6295 3.3* 3.5 - 5.2 g/dL Final  . AST 28/41/3244 24  0 - 37 U/L Final  . ALT 11/02/2012 7  0 - 35 U/L Final  . Alkaline Phosphatase 11/02/2012 50  39 - 117 U/L Final  . Total Bilirubin 11/02/2012 0.4  0.3 - 1.2 mg/dL Final  . GFR calc non Af Amer 11/02/2012 37* >90 mL/min Final  . GFR calc Af Amer 11/02/2012 43* >90 mL/min Final   Comment:                                 The eGFR has been calculated                          using the CKD EPI equation.                          This calculation has not been                           validated in all clinical  situations.                          eGFR's persistently                          <90 mL/min signify                          possible Chronic Kidney Disease.  . Lipase 11/02/2012 13  11 - 59 U/L Final  . Color, Urine 11/02/2012 YELLOW  YELLOW Final  . APPearance 11/02/2012 CLOUDY* CLEAR Final  . Specific Gravity, Urine 11/02/2012 1.020  1.005 - 1.030 Final  . pH 11/02/2012 5.5  5.0 - 8.0 Final  . Glucose, UA 11/02/2012 NEGATIVE  NEGATIVE mg/dL Final  . Hgb urine dipstick 11/02/2012 NEGATIVE  NEGATIVE Final  . Bilirubin Urine 11/02/2012 SMALL* NEGATIVE Final  . Ketones, ur 11/02/2012 15* NEGATIVE mg/dL Final  . Protein, ur 62/13/0865 NEGATIVE  NEGATIVE mg/dL Final  . Urobilinogen, UA 11/02/2012 0.2  0.0 - 1.0 mg/dL Final  . Nitrite 78/46/9629 NEGATIVE  NEGATIVE Final  . Leukocytes, UA 11/02/2012 MODERATE* NEGATIVE Final  . Pro B Natriuretic peptide (BNP) 11/02/2012 325.7  0 - 450 pg/mL Final  . Lactic Acid, Venous 11/02/2012 1.80  0.5 - 2.2 mmol/L Final  . Squamous Epithelial / LPF 11/02/2012 RARE  RARE Final  . WBC, UA 11/02/2012 3-6  <3 WBC/hpf Final  . Bacteria, UA 11/02/2012 FEW* RARE Final  . Specimen Description 11/02/2012 URINE, RANDOM   Final  . Special Requests 11/02/2012 NONE   Final  . Culture  Setup Time 11/02/2012 11/03/2012 01:19   Final  . Colony Count 11/02/2012 >=100,000 COLONIES/ML   Final  . Culture 11/02/2012 ESCHERICHIA COLI   Final  . Report Status 11/02/2012 11/04/2012 FINAL   Final  . Organism ID, Bacteria 11/02/2012 ESCHERICHIA COLI   Final  . Troponin I 11/02/2012 <0.30  <0.30 ng/mL Final   Comment:                                 Due to the release kinetics of cTnI,                          a negative result within the first hours                          of the onset of symptoms does not rule out                          myocardial infarction with certainty.                          If  myocardial infarction is still suspected,                          repeat the test at appropriate intervals.  . Lipase 11/02/2012 12  11 - 59 U/L Final  . Sodium 11/03/2012 137  135 - 145 mEq/L Final  . Potassium 11/03/2012 3.1* 3.5 - 5.1 mEq/L Final   Comment: DELTA CHECK NOTED  REPEATED TO VERIFY  . Chloride 11/03/2012 97  96 - 112 mEq/L Final  . CO2 11/03/2012 31  19 - 32 mEq/L Final  . Glucose, Bld 11/03/2012 97  70 - 99 mg/dL Final  . BUN 29/56/2130 15  6 - 23 mg/dL Final  . Creatinine, Ser 11/03/2012 1.15* 0.50 - 1.10 mg/dL Final  . Calcium 86/57/8469 8.8  8.4 - 10.5 mg/dL Final  . GFR calc non Af Amer 11/03/2012 43* >90 mL/min Final  . GFR calc Af Amer 11/03/2012 50* >90 mL/min Final   Comment:                                 The eGFR has been calculated                          using the CKD EPI equation.                          This calculation has not been                          validated in all clinical                          situations.                          eGFR's persistently                          <90 mL/min signify                          possible Chronic Kidney Disease.  . WBC 11/03/2012 5.1  4.0 - 10.5 K/uL Final  . RBC 11/03/2012 3.71* 3.87 - 5.11 MIL/uL Final  . Hemoglobin 11/03/2012 11.3* 12.0 - 15.0 g/dL Final   Comment: REPEATED TO VERIFY                          DELTA CHECK NOTED  . HCT 11/03/2012 34.1* 36.0 - 46.0 % Final  . MCV 11/03/2012 91.9  78.0 - 100.0 fL Final  . MCH 11/03/2012 30.5  26.0 - 34.0 pg Final  . MCHC 11/03/2012 33.1  30.0 - 36.0 g/dL Final  . RDW 62/95/2841 14.9  11.5 - 15.5 % Final  . Platelets 11/03/2012 204  150 - 400 K/uL Final  . WBC 11/04/2012 4.5  4.0 - 10.5 K/uL Final  . RBC 11/04/2012 3.81* 3.87 - 5.11 MIL/uL Final  . Hemoglobin 11/04/2012 11.6* 12.0 - 15.0 g/dL Final  . HCT 32/44/0102 35.5* 36.0 - 46.0 % Final  . MCV 11/04/2012 93.2  78.0 - 100.0 fL Final  . MCH 11/04/2012 30.4  26.0 -  34.0 pg Final  . MCHC 11/04/2012 32.7  30.0 - 36.0 g/dL Final  . RDW 72/53/6644 15.1  11.5 - 15.5 % Final  . Platelets 11/04/2012 233  150 - 400 K/uL Final  . Sodium 11/04/2012 141  135 - 145 mEq/L Final  . Potassium 11/04/2012 3.6  3.5 - 5.1 mEq/L Final  . Chloride 11/04/2012 106  96 - 112 mEq/L Final   DELTA CHECK NOTED  . CO2  11/04/2012 26  19 - 32 mEq/L Final  . Glucose, Bld 11/04/2012 101* 70 - 99 mg/dL Final  . BUN 16/01/9603 12  6 - 23 mg/dL Final  . Creatinine, Ser 11/04/2012 1.08  0.50 - 1.10 mg/dL Final  . Calcium 54/12/8117 9.0  8.4 - 10.5 mg/dL Final  . GFR calc non Af Amer 11/04/2012 46* >90 mL/min Final  . GFR calc Af Amer 11/04/2012 54* >90 mL/min Final   Comment:                                 The eGFR has been calculated                          using the CKD EPI equation.                          This calculation has not been                          validated in all clinical                          situations.                          eGFR's persistently                          <90 mL/min signify                          possible Chronic Kidney Disease.  . WBC 11/06/2012 4.3  4.0 - 10.5 K/uL Final  . RBC 11/06/2012 3.97  3.87 - 5.11 MIL/uL Final  . Hemoglobin 11/06/2012 11.9* 12.0 - 15.0 g/dL Final  . HCT 14/78/2956 36.8  36.0 - 46.0 % Final  . MCV 11/06/2012 92.7  78.0 - 100.0 fL Final  . MCH 11/06/2012 30.0  26.0 - 34.0 pg Final  . MCHC 11/06/2012 32.3  30.0 - 36.0 g/dL Final  . RDW 21/30/8657 15.4  11.5 - 15.5 % Final  . Platelets 11/06/2012 209  150 - 400 K/uL Final  . Sodium 11/06/2012 140  135 - 145 mEq/L Final  . Potassium 11/06/2012 3.5  3.5 - 5.1 mEq/L Final  . Chloride 11/06/2012 109  96 - 112 mEq/L Final  . CO2 11/06/2012 22  19 - 32 mEq/L Final  . Glucose, Bld 11/06/2012 104* 70 - 99 mg/dL Final  . BUN 84/69/6295 9  6 - 23 mg/dL Final  . Creatinine, Ser 11/06/2012 0.95  0.50 - 1.10 mg/dL Final  . Calcium 28/41/3244 9.1  8.4 - 10.5 mg/dL Final   . GFR calc non Af Amer 11/06/2012 54* >90 mL/min Final  . GFR calc Af Amer 11/06/2012 63* >90 mL/min Final   Comment:                                 The eGFR has been calculated                          using the CKD EPI equation.  This calculation has not been                          validated in all clinical                          situations.                          eGFR's persistently                          <90 mL/min signify                          possible Chronic Kidney Disease.  Hospital Outpatient Visit on 10/20/2012  Component Date Value Range Status  . WBC 10/20/2012 5.2  3.9 - 10.3 10e3/uL Final  . NEUT# 10/20/2012 3.9  1.5 - 6.5 10e3/uL Final  . HGB 10/20/2012 13.0  11.6 - 15.9 g/dL Final  . HCT 16/01/9603 40.3  34.8 - 46.6 % Final  . Platelets 10/20/2012 220  145 - 400 10e3/uL Final  . MCV 10/20/2012 93.5  79.5 - 101.0 fL Final  . MCH 10/20/2012 30.2  25.1 - 34.0 pg Final  . MCHC 10/20/2012 32.3  31.5 - 36.0 g/dL Final  . RBC 54/12/8117 4.31  3.70 - 5.45 10e6/uL Final  . RDW 10/20/2012 14.7* 11.2 - 14.5 % Final  . lymph# 10/20/2012 0.6* 0.9 - 3.3 10e3/uL Final  . MONO# 10/20/2012 0.4  0.1 - 0.9 10e3/uL Final  . Eosinophils Absolute 10/20/2012 0.2  0.0 - 0.5 10e3/uL Final  . Basophils Absolute 10/20/2012 0.0  0.0 - 0.1 10e3/uL Final  . NEUT% 10/20/2012 75.8  38.4 - 76.8 % Final  . LYMPH% 10/20/2012 12.2* 14.0 - 49.7 % Final  . MONO% 10/20/2012 8.5  0.0 - 14.0 % Final  . EOS% 10/20/2012 3.3  0.0 - 7.0 % Final  . BASO% 10/20/2012 0.2  0.0 - 2.0 % Final  . Sodium 10/20/2012 137  136 - 145 mEq/L Final  . Potassium 10/20/2012 4.1  3.5 - 5.1 mEq/L Final  . Chloride 10/20/2012 99  98 - 107 mEq/L Final  . CO2 10/20/2012 30* 22 - 29 mEq/L Final  . Glucose 10/20/2012 106* 70 - 99 mg/dl Final  . BUN 14/78/2956 20.3  7.0 - 26.0 mg/dL Final  . Creatinine 21/30/8657 1.4* 0.6 - 1.1 mg/dL Final  . Total Bilirubin 10/20/2012 0.42  0.20 - 1.20 mg/dL  Final  . Alkaline Phosphatase 10/20/2012 48  40 - 150 U/L Final  . AST 10/20/2012 16  5 - 34 U/L Final  . ALT 10/20/2012 8  0 - 55 U/L Final  . Total Protein 10/20/2012 7.0  6.4 - 8.3 g/dL Final  . Albumin 84/69/6295 3.5  3.5 - 5.0 g/dL Final  . Calcium 28/41/3244 9.6  8.4 - 10.4 mg/dL Final   Annual summary: Hospitalizations:11/04/2012; pt was dx may 2014 with her cancer  Infection History: pt had UTI in hosp, txed Functional assessment: Pt can do everything for herself, she just gets tired Areas of potential improvement: increase diet andincrease strength Rehabilitation Potential: excellent;pt is deconditioned but willing Prognosis for survival:immediate survival -good;long term-unknown, pt just finished her first round XRT And is due for another after radiation esophagitis resolves  Radiation esophagitis Reason for hospitalization;is improving with diflucan,and carafate  and protonix;will continue same;n/v has resolved and abd pain is much imporoved  Lung cancer, lower lobe Radiation treatment is on hold while pt recovers from esophagitis  HYPOTENSION BP is running low but pt needs to remain on her meds for cardiomyopathy;pt got hypotensive because she was dehydrated from poor po intake with esophagitis;her intake is improved greatly  UTI (urinary tract infection) Pt no longer on antibiotics;pt has no UTI sx and no fever  SYSTOLIC HEART FAILURE, CHRONIC Stable/at baseline currently; pt to stay on usual meds  Anemia of chronic disease Will recheck CBC  Hypokalemia Was low normal on d/c;Plan recheck BMP which will also look at renal function

## 2012-11-10 NOTE — Assessment & Plan Note (Signed)
Stable/at baseline currently; pt to stay on usual meds

## 2012-11-10 NOTE — Assessment & Plan Note (Addendum)
Was low normal on d/c;Plan recheck BMP which will also look at renal function

## 2012-11-10 NOTE — Assessment & Plan Note (Addendum)
Reason for hospitalization;is improving with diflucan,and carafate and protonix;will continue same;n/v has resolved and abd pain is much imporoved

## 2012-11-10 NOTE — Assessment & Plan Note (Signed)
Pt no longer on antibiotics;pt has no UTI sx and no fever

## 2012-11-10 NOTE — Assessment & Plan Note (Signed)
Will recheck CBC

## 2012-11-10 NOTE — Assessment & Plan Note (Signed)
BP is running low but pt needs to remain on her meds for cardiomyopathy;pt got hypotensive because she was dehydrated from poor po intake with esophagitis;her intake is improved greatly

## 2012-11-10 NOTE — Assessment & Plan Note (Signed)
Radiation treatment is on hold while pt recovers from esophagitis

## 2012-11-12 NOTE — Progress Notes (Signed)
  Radiation Oncology         [phone] ________________________________  Name: Heather Bullock MRN: [id number]  Date: 10/27/2012  DOB: [birth year]  End of Treatment Note  Diagnosis:   Non small cell lung cancer     Indication for treatment:  Curative       Radiation treatment dates:   09/16/2012 - 10/27/2012  Site/dose:   The patient received 70.2 Gy in 26 fractions at 2.7 Gy per fraction. She received a 5-field 3D conformal technique with daily image guidance.  Narrative: The patient tolerated radiation treatment relatively well.   She continued to have significant fatigue and had esophagitis most notable in the 2nd half of treatment.  Plan: The patient has completed radiation treatment. The patient will return to radiation oncology clinic for routine followup in one month. I advised the patient to call or return sooner if they have any questions or concerns related to their recovery or treatment. ________________________________  Radene Gunning, M.D., Ph.D.

## 2012-11-12 NOTE — Progress Notes (Signed)
  Radiation Oncology         [phone] ________________________________  Name: Heather Bullock MRN: [id number]  Date: 09/15/2012  DOB: [birth year]  Simulation Verification Note   NARRATIVE: The patient was brought to the treatment unit and placed in the planned treatment position. The clinical setup was verified. Then port films were obtained and uploaded to the radiation oncology medical record software.  The treatment beams were carefully compared against the planned radiation fields. A cone beam CT scan was also obtained. The position, location, and shape of the radiation fields was reviewed. The targeted volume of tissue appears to be appropriately covered by the radiation beams. Based on my personal review, I approved the simulation verification. The patient's treatment will proceed as planned.  ________________________________   Radene Gunning, MD, PhD

## 2012-11-22 ENCOUNTER — Inpatient Hospital Stay (HOSPITAL_COMMUNITY): Payer: Medicare Other

## 2012-11-22 ENCOUNTER — Inpatient Hospital Stay (HOSPITAL_COMMUNITY)
Admission: EM | Admit: 2012-11-22 | Discharge: 2012-11-26 | DRG: 391 | Disposition: A | Discharge status: Skilled Nursing Facility | Payer: Medicare Other | Attending: Internal Medicine | Admitting: Internal Medicine

## 2012-11-22 ENCOUNTER — Emergency Department (HOSPITAL_COMMUNITY): Payer: Medicare Other

## 2012-11-22 ENCOUNTER — Encounter (HOSPITAL_COMMUNITY): Payer: Self-pay

## 2012-11-22 DIAGNOSIS — K208 Other esophagitis without bleeding: Principal | ICD-10-CM | POA: Diagnosis present

## 2012-11-22 DIAGNOSIS — I428 Other cardiomyopathies: Secondary | ICD-10-CM | POA: Diagnosis present

## 2012-11-22 DIAGNOSIS — I129 Hypertensive chronic kidney disease with stage 1 through stage 4 chronic kidney disease, or unspecified chronic kidney disease: Secondary | ICD-10-CM | POA: Diagnosis present

## 2012-11-22 DIAGNOSIS — Z79899 Other long term (current) drug therapy: Secondary | ICD-10-CM

## 2012-11-22 DIAGNOSIS — W19XXXA Unspecified fall, initial encounter: Secondary | ICD-10-CM

## 2012-11-22 DIAGNOSIS — N179 Acute kidney failure, unspecified: Secondary | ICD-10-CM | POA: Diagnosis present

## 2012-11-22 DIAGNOSIS — D696 Thrombocytopenia, unspecified: Secondary | ICD-10-CM | POA: Diagnosis present

## 2012-11-22 DIAGNOSIS — N390 Urinary tract infection, site not specified: Secondary | ICD-10-CM | POA: Diagnosis present

## 2012-11-22 DIAGNOSIS — N189 Chronic kidney disease, unspecified: Secondary | ICD-10-CM | POA: Diagnosis present

## 2012-11-22 DIAGNOSIS — I509 Heart failure, unspecified: Secondary | ICD-10-CM | POA: Diagnosis present

## 2012-11-22 DIAGNOSIS — C3430 Malignant neoplasm of lower lobe, unspecified bronchus or lung: Secondary | ICD-10-CM | POA: Diagnosis present

## 2012-11-22 DIAGNOSIS — T66XXXS Radiation sickness, unspecified, sequela: Secondary | ICD-10-CM

## 2012-11-22 DIAGNOSIS — E871 Hypo-osmolality and hyponatremia: Secondary | ICD-10-CM | POA: Diagnosis present

## 2012-11-22 DIAGNOSIS — Z6827 Body mass index (BMI) 27.0-27.9, adult: Secondary | ICD-10-CM

## 2012-11-22 DIAGNOSIS — E860 Dehydration: Secondary | ICD-10-CM | POA: Diagnosis present

## 2012-11-22 DIAGNOSIS — E876 Hypokalemia: Secondary | ICD-10-CM | POA: Diagnosis present

## 2012-11-22 DIAGNOSIS — I5022 Chronic systolic (congestive) heart failure: Secondary | ICD-10-CM | POA: Diagnosis present

## 2012-11-22 DIAGNOSIS — D638 Anemia in other chronic diseases classified elsewhere: Secondary | ICD-10-CM | POA: Diagnosis present

## 2012-11-22 DIAGNOSIS — A498 Other bacterial infections of unspecified site: Secondary | ICD-10-CM | POA: Diagnosis present

## 2012-11-22 DIAGNOSIS — J449 Chronic obstructive pulmonary disease, unspecified: Secondary | ICD-10-CM | POA: Diagnosis present

## 2012-11-22 DIAGNOSIS — Z95810 Presence of automatic (implantable) cardiac defibrillator: Secondary | ICD-10-CM | POA: Diagnosis present

## 2012-11-22 DIAGNOSIS — J4489 Other specified chronic obstructive pulmonary disease: Secondary | ICD-10-CM | POA: Diagnosis present

## 2012-11-22 DIAGNOSIS — E43 Unspecified severe protein-calorie malnutrition: Secondary | ICD-10-CM | POA: Insufficient documentation

## 2012-11-22 DIAGNOSIS — R112 Nausea with vomiting, unspecified: Secondary | ICD-10-CM | POA: Diagnosis present

## 2012-11-22 DIAGNOSIS — Z85118 Personal history of other malignant neoplasm of bronchus and lung: Secondary | ICD-10-CM | POA: Diagnosis present

## 2012-11-22 DIAGNOSIS — B009 Herpesviral infection, unspecified: Secondary | ICD-10-CM | POA: Diagnosis present

## 2012-11-22 DIAGNOSIS — I10 Essential (primary) hypertension: Secondary | ICD-10-CM | POA: Diagnosis present

## 2012-11-22 DIAGNOSIS — Y842 Radiological procedure and radiotherapy as the cause of abnormal reaction of the patient, or of later complication, without mention of misadventure at the time of the procedure: Secondary | ICD-10-CM | POA: Diagnosis present

## 2012-11-22 LAB — BASIC METABOLIC PANEL
BUN: 30 mg/dL — ABNORMAL HIGH (ref 6–23)
GFR calc non Af Amer: 31 mL/min — ABNORMAL LOW (ref >90)
Glucose, Bld: 103 mg/dL — ABNORMAL HIGH (ref 70–99)
Potassium: 3.2 mEq/L — ABNORMAL LOW (ref 3.5–5.1)

## 2012-11-22 LAB — CBC WITH DIFFERENTIAL/PLATELET
Eosinophils Absolute: 0.1 10*3/uL (ref 0.0–0.7)
HCT: 38 % (ref 36.0–46.0)
Hemoglobin: 12.3 g/dL (ref 12.0–15.0)
Lymphs Abs: 1.6 10*3/uL (ref 0.7–4.0)
MCH: 30.4 pg (ref 26.0–34.0)
MCV: 93.8 fL (ref 78.0–100.0)
Monocytes Relative: 14 % — ABNORMAL HIGH (ref 3–12)
Neutrophils Relative %: 61 % (ref 43–77)
RBC: 4.05 MIL/uL (ref 3.87–5.11)

## 2012-11-22 MED ORDER — OXYBUTYNIN CHLORIDE 5 MG PO TABS
5.0000 mg | ORAL_TABLET | Freq: Every day | ORAL | Status: DC
  Administered 2012-11-22 – 2012-11-26 (×5): 5 mg via ORAL
  Filled 2012-11-22 (×5): qty 1

## 2012-11-22 MED ORDER — POTASSIUM CHLORIDE ER 10 MEQ PO TBCR
20.0000 meq | EXTENDED_RELEASE_TABLET | Freq: Every day | ORAL | Status: DC
  Administered 2012-11-23 – 2012-11-24 (×2): 20 meq via ORAL
  Filled 2012-11-22 (×4): qty 2

## 2012-11-22 MED ORDER — MORPHINE SULFATE 10 MG/5ML PO SOLN
2.5000 mg | ORAL | Status: DC | PRN
  Administered 2012-11-22 – 2012-11-24 (×3): 2.5 mg via ORAL
  Administered 2012-11-25: 10:00:00 via ORAL
  Administered 2012-11-25 – 2012-11-26 (×5): 2.5 mg via ORAL
  Filled 2012-11-22 (×9): qty 5

## 2012-11-22 MED ORDER — DOCUSATE SODIUM 100 MG PO CAPS
100.0000 mg | ORAL_CAPSULE | Freq: Every day | ORAL | Status: DC
  Administered 2012-11-22 – 2012-11-26 (×5): 100 mg via ORAL
  Filled 2012-11-22 (×5): qty 1

## 2012-11-22 MED ORDER — MORPHINE SULFATE 2 MG/ML IJ SOLN
2.0000 mg | INTRAMUSCULAR | Status: DC | PRN
  Administered 2012-11-22 – 2012-11-26 (×11): 2 mg via INTRAVENOUS
  Filled 2012-11-22 (×12): qty 1

## 2012-11-22 MED ORDER — POLYETHYLENE GLYCOL 3350 17 G PO PACK
17.0000 g | PACK | Freq: Every day | ORAL | Status: DC
  Administered 2012-11-24: 17 g via ORAL
  Filled 2012-11-22 (×5): qty 1

## 2012-11-22 MED ORDER — MORPHINE SULFATE 4 MG/ML IJ SOLN: 4.0000 mg | INTRAMUSCULAR | Status: DC | PRN

## 2012-11-22 MED ORDER — CARVEDILOL 3.125 MG PO TABS
3.1250 mg | ORAL_TABLET | Freq: Two times a day (BID) | ORAL | Status: DC
  Filled 2012-11-22: qty 1

## 2012-11-22 MED ORDER — FLUCONAZOLE 10 MG/ML PO SUSR: 200.0000 mg | Freq: Every day | ORAL | Status: DC

## 2012-11-22 MED ORDER — MAGIC MOUTHWASH W/LIDOCAINE
10.0000 mL | Freq: Four times a day (QID) | ORAL | Status: DC
  Administered 2012-11-22: 10 mL via ORAL
  Filled 2012-11-22 (×5): qty 10

## 2012-11-22 MED ORDER — SODIUM CHLORIDE 0.9 % IV SOLN
INTRAVENOUS | Status: DC
  Administered 2012-11-22: 22:00:00 via INTRAVENOUS

## 2012-11-22 MED ORDER — ONDANSETRON HCL 4 MG/2ML IJ SOLN
4.0000 mg | Freq: Three times a day (TID) | INTRAMUSCULAR | Status: AC
Start: 2012-11-22 — End: 2012-11-24
  Administered 2012-11-22 – 2012-11-24 (×6): 4 mg via INTRAVENOUS
  Filled 2012-11-22 (×6): qty 2

## 2012-11-22 MED ORDER — FLUCONAZOLE 200 MG PO TABS
200.0000 mg | ORAL_TABLET | Freq: Every day | ORAL | Status: DC
  Administered 2012-11-22: 200 mg via ORAL
  Filled 2012-11-22: qty 1

## 2012-11-22 MED ORDER — HYDROCODONE-ACETAMINOPHEN 10-325 MG PO TABS: 1.0000 | ORAL_TABLET | ORAL | Status: DC | PRN

## 2012-11-22 MED ORDER — SODIUM CHLORIDE 0.9 % IV BOLUS (SEPSIS)
500.0000 mL | Freq: Once | INTRAVENOUS | Status: AC
  Administered 2012-11-22: 500 mL via INTRAVENOUS

## 2012-11-22 MED ORDER — SALMETEROL XINAFOATE 50 MCG/DOSE IN AEPB
1.0000 | INHALATION_SPRAY | Freq: Two times a day (BID) | RESPIRATORY_TRACT | Status: DC
  Administered 2012-11-22 – 2012-11-26 (×8): 1 via RESPIRATORY_TRACT
  Filled 2012-11-22: qty 0

## 2012-11-22 MED ORDER — POTASSIUM CHLORIDE IN NACL 40-0.9 MEQ/L-% IV SOLN
INTRAVENOUS | Status: DC
  Administered 2012-11-22: 23:00:00 via INTRAVENOUS
  Administered 2012-11-23: 75 mL/h via INTRAVENOUS
  Administered 2012-11-24 – 2012-11-26 (×3): via INTRAVENOUS
  Filled 2012-11-22 (×8): qty 1000

## 2012-11-22 MED ORDER — GABAPENTIN 300 MG PO CAPS
300.0000 mg | ORAL_CAPSULE | Freq: Two times a day (BID) | ORAL | Status: DC
  Administered 2012-11-22 – 2012-11-26 (×8): 300 mg via ORAL
  Filled 2012-11-22 (×10): qty 1

## 2012-11-22 MED ORDER — VITAMINS A & D EX OINT
TOPICAL_OINTMENT | CUTANEOUS | Status: AC
  Administered 2012-11-22: 22:00:00
  Filled 2012-11-22: qty 5

## 2012-11-22 MED ORDER — MORPHINE SULFATE 20 MG/5ML PO SOLN: 2.5000 mg | ORAL | Status: DC | PRN

## 2012-11-22 MED ORDER — PANTOPRAZOLE SODIUM 40 MG IV SOLR
40.0000 mg | Freq: Two times a day (BID) | INTRAVENOUS | Status: DC
  Administered 2012-11-22 – 2012-11-26 (×8): 40 mg via INTRAVENOUS
  Filled 2012-11-22 (×9): qty 40

## 2012-11-22 MED ORDER — SUCRALFATE 1 GM/10ML PO SUSP
1.0000 g | Freq: Three times a day (TID) | ORAL | Status: DC
  Administered 2012-11-22 – 2012-11-26 (×15): 1 g via ORAL
  Filled 2012-11-22 (×18): qty 10

## 2012-11-22 MED ORDER — ENOXAPARIN SODIUM 40 MG/0.4ML ~~LOC~~ SOLN
40.0000 mg | SUBCUTANEOUS | Status: DC
  Administered 2012-11-22: 40 mg via SUBCUTANEOUS
  Filled 2012-11-22 (×2): qty 0.4

## 2012-11-22 MED ORDER — ALBUTEROL SULFATE (5 MG/ML) 0.5% IN NEBU: 2.5000 mg | INHALATION_SOLUTION | RESPIRATORY_TRACT | Status: DC | PRN

## 2012-11-22 MED ORDER — POTASSIUM CHLORIDE CRYS ER 20 MEQ PO TBCR
40.0000 meq | EXTENDED_RELEASE_TABLET | ORAL | Status: AC
  Administered 2012-11-22: 40 meq via ORAL
  Filled 2012-11-22 (×2): qty 2

## 2012-11-22 MED ORDER — FLUCONAZOLE IN SODIUM CHLORIDE 200-0.9 MG/100ML-% IV SOLN
200.0000 mg | Freq: Every day | INTRAVENOUS | Status: DC
  Filled 2012-11-22: qty 100

## 2012-11-22 MED ORDER — NYSTATIN 100000 UNIT/ML MT SUSP
500000.0000 [IU] | Freq: Four times a day (QID) | OROMUCOSAL | Status: DC
  Administered 2012-11-22 – 2012-11-25 (×11): 500000 [IU] via ORAL
  Filled 2012-11-22 (×13): qty 5

## 2012-11-22 MED ORDER — ONDANSETRON HCL 4 MG PO TABS: 4.0000 mg | ORAL_TABLET | Freq: Four times a day (QID) | ORAL | Status: DC | PRN

## 2012-11-22 MED ORDER — ACETAMINOPHEN 650 MG RE SUPP: 650.0000 mg | Freq: Four times a day (QID) | RECTAL | Status: DC | PRN

## 2012-11-22 MED ORDER — POLYETHYLENE GLYCOL 3350 17 GM/SCOOP PO POWD
17.0000 g | Freq: Every day | ORAL | Status: DC
  Filled 2012-11-22: qty 255

## 2012-11-22 MED ORDER — SODIUM CHLORIDE 0.9 % IV BOLUS (SEPSIS): 1000.0000 mL | Freq: Once | INTRAVENOUS | Status: AC

## 2012-11-22 MED ORDER — ONDANSETRON HCL 4 MG/2ML IJ SOLN: 4.0000 mg | Freq: Four times a day (QID) | INTRAMUSCULAR | Status: DC | PRN

## 2012-11-22 MED ORDER — BIOTENE DRY MOUTH MT LIQD
15.0000 mL | Freq: Two times a day (BID) | OROMUCOSAL | Status: DC
  Administered 2012-11-23 – 2012-11-25 (×6): 15 mL via OROMUCOSAL

## 2012-11-22 MED ORDER — METOPROLOL TARTRATE 1 MG/ML IV SOLN
2.5000 mg | Freq: Three times a day (TID) | INTRAVENOUS | Status: DC
  Administered 2012-11-23 – 2012-11-26 (×9): 2.5 mg via INTRAVENOUS
  Filled 2012-11-22 (×15): qty 5

## 2012-11-22 MED ORDER — DIAZEPAM 5 MG PO TABS: 5.0000 mg | ORAL_TABLET | Freq: Four times a day (QID) | ORAL | Status: DC | PRN

## 2012-11-22 MED ORDER — BOOST / RESOURCE BREEZE PO LIQD
1.0000 | Freq: Three times a day (TID) | ORAL | Status: DC
  Administered 2012-11-23 – 2012-11-25 (×4): 1 via ORAL

## 2012-11-22 MED ORDER — PANTOPRAZOLE SODIUM 40 MG PO TBEC: 40.0000 mg | DELAYED_RELEASE_TABLET | Freq: Two times a day (BID) | ORAL | Status: DC

## 2012-11-22 MED ORDER — BIOTENE DRY MOUTH MT LIQD
15.0000 mL | Freq: Two times a day (BID) | OROMUCOSAL | Status: DC
  Administered 2012-11-23 (×2): 15 mL via OROMUCOSAL

## 2012-11-22 MED ORDER — MAGNESIUM HYDROXIDE 400 MG/5ML PO SUSP
5.0000 mL | Freq: Every day | ORAL | Status: DC | PRN
  Administered 2012-11-23: 5 mL via ORAL
  Filled 2012-11-22 (×2): qty 30

## 2012-11-22 MED ORDER — DICLOFENAC EPOLAMINE 1.3 % TD PTCH
1.0000 | MEDICATED_PATCH | Freq: Two times a day (BID) | TRANSDERMAL | Status: DC
  Administered 2012-11-23 – 2012-11-25 (×4): 1 via TRANSDERMAL
  Filled 2012-11-22 (×11): qty 1

## 2012-11-22 MED ORDER — SIMVASTATIN 20 MG PO TABS
20.0000 mg | ORAL_TABLET | Freq: Every day | ORAL | Status: DC
  Administered 2012-11-23 – 2012-11-25 (×3): 20 mg via ORAL
  Filled 2012-11-22 (×4): qty 1

## 2012-11-22 MED ORDER — SODIUM CHLORIDE 0.9 % IV SOLN: INTRAVENOUS | Status: DC

## 2012-11-22 MED ORDER — SODIUM CHLORIDE 0.9 % IV SOLN
Freq: Once | INTRAVENOUS | Status: AC
  Administered 2012-11-22: 17:00:00 via INTRAVENOUS

## 2012-11-22 MED ORDER — ACETAMINOPHEN 325 MG PO TABS: 650.0000 mg | ORAL_TABLET | Freq: Four times a day (QID) | ORAL | Status: DC | PRN

## 2012-11-22 MED ORDER — ZOLPIDEM TARTRATE 5 MG PO TABS
5.0000 mg | ORAL_TABLET | Freq: Every day | ORAL | Status: DC
  Administered 2012-11-22 – 2012-11-25 (×4): 5 mg via ORAL
  Filled 2012-11-22 (×4): qty 1

## 2012-11-22 NOTE — ED Notes (Signed)
Pt c/o lower back pain, after a fall x 2 days and vomiting x 3 months.  Pt sts her esophagus was "burned" by radiation treatment and now, she can't keep anything down.

## 2012-11-22 NOTE — ED Notes (Addendum)
MD at bedside, Dr. Janee Morn doing assessment. Unable to transport  to floor presently.

## 2012-11-22 NOTE — H&P (Addendum)
[facility] History and Physical  Heather Bullock ZOX:[id number] DOB: [birth year] DOA: 11/22/2012  Referring physician: Dr Rhunette Croft PCP: Thora Lance, MD  Specialists: Cardiology: Dr. Thomasene Lot. Hochrein/ radiation oncologist: Dr. Mitzi Hansen  Chief Complaint: Back pain  HPI: Heather Bullock is a 79 y.o. female with history of lung cancer status post radiation therapy, recently diagnosed radiation esophagitis leading to decreased oral intake, history of nonischemic cardiomyopathy EF of 25-30% per 2-D echo 07/26/12, hypertension, status post ICD, chronic renal insufficiency, history of CHF presented to the ED after a fall. Patient states that she fell 2 days prior to admission when she got up and fell backwards hitting her tailbone. Patient stated that has been in pain since then. Patient denies any syncope, no chest pain, no change in her chronic shortness of breath. Patient does endorse some nausea and emesis. Patient denies any diarrhea. No fever, no chills, no abdominal pain. Patient does endorse some generalized weakness. Patient stated that has had poor oral intake secondary to radiation esophagitis which causes burning. Patient states only been able to tolerate water. Patient presented to the ED plain films of the T-spine L-spine and L-spine were negative for any acute fracture. Basic metabolic profile done had a potassium of 3.2 BUN of 30 and a creatinine of 1.52. CBC was within normal limits. Urinalysis was pending. We were called to admit the patient for further evaluation and management.  Review of Systems: The patient denies anorexia, fever, weight loss,, vision loss, decreased hearing, hoarseness, chest pain, syncope, dyspnea on exertion, peripheral edema, balance deficits, hemoptysis, abdominal pain, melena, hematochezia, severe indigestion/heartburn, hematuria, incontinence, genital sores, muscle weakness, suspicious skin lesions, transient blindness, difficulty walking, depression, unusual weight  change, abnormal bleeding, enlarged lymph nodes, angioedema, and breast masses.   Past Medical History  Diagnosis Date  . LBBB (left bundle branch block)   . Nonischemic cardiomyopathy     EF 25-30% echo 2010, 45% echo 2012  . Hypertension   . Diverticulitis   . ICD (implantable cardiac defibrillator) in place     CRT-D [employer]  . Chronic fatigue   . Fibromyalgia   . Shortness of breath     followed by Dr. Shelle Iron  . Elevated uric acid   . CRI (chronic renal insufficiency)   . Abdominal bloating   . Osteopenia   . CHF (congestive heart failure)   . Arthritis   . COPD (chronic obstructive pulmonary disease)   . Asthma   . Non-small cell lung cancer    Past Surgical History  Procedure Laterality Date  . Crt-d-st. jude's    . Replacement total knee    . Abdominal hysterectomy    . Appendectomy    . Neck surgery    . Tonsillectomy    . Neck surgery  02/2007    cervical  . Laser surgery left eye  2013  . Oral surgery/infection in root of teeth  2013  . Video bronchoscopy Bilateral 08/12/2012    Procedure: VIDEO BRONCHOSCOPY WITHOUT FLUORO;  Surgeon: Barbaraann Share, MD;  Location: [facility];  Service: Cardiopulmonary;  Laterality: Bilateral;  . Esophagogastroduodenoscopy N/A 11/05/2012    Procedure: ESOPHAGOGASTRODUODENOSCOPY (EGD);  Surgeon: Vertell Novak., MD;  Location: [facility];  Service: Endoscopy;  Laterality: N/A;   Social History:  reports that she has never smoked. She has never used smokeless tobacco. She reports that she does not drink alcohol or use illicit drugs.  Allergies  Allergen Reactions  . Bacitracin   . Imuran (Azathioprine)   .  Metoclopramide Hcl     Reglan  . Nabumetone     Relafen  . Neomycin-Bacitracin Zn-Polymyx   . Nitroglycerin   . Nsaids   . Nutritional Supplements   . Other     No blood products - pt is of [employer]  . Plaquenil (Hydroxychloroquine Sulfate)   . Tizanidine     Lowers blood pressure    Family  History  Problem Relation Age of Onset  . Heart disease Son   . Colon cancer Sister   . Lung cancer Father   . Heart disease Mother   . Heart disease Brother   . Hypertension Mother   . Diabetes Brother     Prior to Admission medications   Medication Sig Start Date End Date Taking? Authorizing Provider  carvedilol (COREG) 3.125 MG tablet Take 1 tablet (3.125 mg total) by mouth 2 (two) times daily with a meal. 02/05/12  Yes Rollene Rotunda, MD  furosemide (LASIX) 40 MG tablet Take 2 pills (80 mg) two times per day 08/10/12  Yes Rollene Rotunda, MD  gabapentin (NEURONTIN) 300 MG capsule Take 300 mg by mouth 2 (two) times daily.    Yes Historical Provider, MD  Glucosamine-Chondroitin (OSTEO BI-FLEX REGULAR STRENGTH PO) Take 2 tablets by mouth daily.     Yes Historical Provider, MD  HYDROcodone-acetaminophen (NORCO) 10-325 MG per tablet Take 1 tablet by mouth every 4 (four) hours as needed for pain. 11/08/12  Yes Alison Murray, MD  losartan (COZAAR) 25 MG tablet Take 0.5 tablets (12.5 mg total) by mouth 2 (two) times daily. 09/06/12  Yes Rollene Rotunda, MD  NON FORMULARY Mega Red daily    Yes Historical Provider, MD  oxybutynin (DITROPAN) 5 MG tablet Take 5 mg by mouth daily.     Yes Historical Provider, MD  pantoprazole (PROTONIX) 40 MG tablet Take 1 tablet (40 mg total) by mouth 2 (two) times daily. 11/08/12  Yes Alison Murray, MD  potassium chloride 20 MEQ TBCR Take 20 mEq by mouth daily. 11/08/12  Yes Alison Murray, MD  pravastatin (PRAVACHOL) 40 MG tablet Take 40 mg by mouth daily.     Yes Historical Provider, MD  Acetaminophen (TYLENOL EXTRA STRENGTH PO) Take 650 mg by mouth as needed.    Historical Provider, MD  albuterol (PROVENTIL) (5 MG/ML) 0.5% nebulizer solution Take 0.5 mLs (2.5 mg total) by nebulization every 4 (four) hours as needed for wheezing or shortness of breath. 11/08/12   Alison Murray, MD  Alum & Mag Hydroxide-Simeth (MAGIC MOUTHWASH W/LIDOCAINE) SOLN 1part nystatin,1part  Maaloxplus,1part benadryl,3part 2%viscous lidocaine. Swallow 10 mL up to QID, before meals/bedtime.  Called in [place] [facility] [address] for refill on 10/15/2012 with 3 refills. 10/04/12   Lonie Peak, MD  Ascorbic Acid (VITAMIN C) 500 MG tablet Take 1,000 mg by mouth daily.     Historical Provider, MD  Calcium Citrate-Vitamin D (CALCIUM CITRATE + D PO) Take by mouth daily.     Historical Provider, MD  diazepam (VALIUM) 5 MG tablet Take 1 tablet (5 mg total) by mouth every 6 (six) hours as needed. 11/08/12   Alison Murray, MD  diclofenac (FLECTOR) 1.3 % PTCH Place 1 patch onto the skin 2 (two) times daily.    Historical Provider, MD  docusate sodium (COLACE) 100 MG capsule daily.     Historical Provider, MD  feeding supplement (RESOURCE BREEZE) LIQD Take 1 Container by mouth 3 (three) times daily between meals. 11/08/12   Wendi Maya  Elisabeth Pigeon, MD  fluconazole (DIFLUCAN) 10 MG/ML suspension Take 20 mLs (200 mg total) by mouth daily. 11/08/12 11/22/12  Alison Murray, MD  formoterol (FORADIL AEROLIZER) 12 MCG capsule for inhaler Place 1 capsule (12 mcg total) into inhaler and inhale 2 (two) times daily. 08/30/12   Barbaraann Share, MD  hyaluronate sodium (RADIAPLEXRX) GEL Apply 1 application topically 2 (two) times daily.    Historical Provider, MD  magnesium hydroxide (MILK OF MAGNESIA) 400 MG/5ML suspension Take 5 mLs by mouth daily as needed for constipation.    Historical Provider, MD  morphine 20 MG/5ML solution Take 0.6 mLs (2.4 mg total) by mouth every 2 (two) hours as needed for pain. 11/08/12   Alison Murray, MD  Multiple Vitamin (MULTIVITAMIN) capsule Take 1 capsule by mouth daily.      Historical Provider, MD  Multiple Vitamin (MULTIVITAMIN) LIQD Take 5 mLs by mouth daily. 11/08/12   Alison Murray, MD  nystatin (MYCOSTATIN) 100000 UNIT/ML suspension Take 5 mLs (500,000 Units total) by mouth 4 (four) times daily. 10/08/12   Oneita Hurt, MD  ondansetron (ZOFRAN) 8 MG tablet Take 1 tablet (8 mg total) by  mouth every 8 (eight) hours as needed for nausea. 10/25/12   Jonna Coup, MD  polyethylene glycol powder [facility] [facility]) powder Take 17 g by mouth daily. 10/08/12   Oneita Hurt, MD  Psyllium (VEGETABLE LAXATIVE PO) as needed.      Historical Provider, MD  sucralfate (CARAFATE) 1 GM/10ML suspension Take 10 mLs (1 g total) by mouth 4 (four) times daily -  with meals and at bedtime. 11/08/12   Alison Murray, MD  triamcinolone cream (KENALOG) 0.1 % As needed 12/19/11   Historical Provider, MD  Vitamin D, Ergocalciferol, (DRISDOL) 50000 UNITS CAPS Take 50,000 Units by mouth every 7 (seven) days.      Historical Provider, MD  zolpidem (AMBIEN) 5 MG tablet Take 1 tablet (5 mg total) by mouth at bedtime. 11/08/12   Alison Murray, MD   Physical Exam: Filed Vitals:   11/22/12 1447 11/22/12 1752  BP: 94/59 115/56  Pulse: 69 61  Temp: 98.7 F (37.1 C) 98.9 F (37.2 C)  TempSrc: Oral Oral  Resp: 19 13  SpO2: 94% 95%     General:  Elderly female in no acute cardiopulmonary distress.  Eyes: Pupils equal round and reactive to light and accommodation. Extra movements intact.  ENT: Oropharynx is clear, no lesions, no exudates. Extremely dry mucous membranes.  Neck: Supple with no lymphadenopathy.  Cardiovascular: Regular rate rhythm no murmurs rubs gallops.  Respiratory: Clear to auscultation bilaterally. No wheezes, no crackles, no rhonchi.  Abdomen: Soft, nontender, nondistended, positive bowel sounds.  Skin: No rashes or lesions.  Musculoskeletal: 4/5 BUE strength,4/5 BLE strength.  Psychiatric: Normal mood. Normal affect. Fair insight. Fair judgment.  Neurologic: Alert and oriented x3. Cranial nerves II through XII are grossly intact. No focal deficits.  Labs on Admission:  Basic Metabolic Panel:  Recent Labs Lab 11/22/12 1723  NA 138  K 3.2*  CL 96  CO2 32  GLUCOSE 103*  BUN 30*  CREATININE 1.52*  CALCIUM 9.4   Liver Function Tests: No results found for this basename:  AST, ALT, ALKPHOS, BILITOT, PROT, ALBUMIN,  in the last 168 hours No results found for this basename: LIPASE, AMYLASE,  in the last 168 hours No results found for this basename: AMMONIA,  in the last 168 hours CBC:  Recent Labs Lab 11/22/12 1723  WBC 7.0  NEUTROABS 4.3  HGB 12.3  HCT 38.0  MCV 93.8  PLT PLATELET CLUMPS NOTED ON SMEAR, COUNT APPEARS DECREASED   Cardiac Enzymes: No results found for this basename: CKTOTAL, CKMB, CKMBINDEX, TROPONINI,  in the last 168 hours  BNP (last 3 results)  Recent Labs  12/26/11 1043 11/02/12 1627  PROBNP 63.0 325.7   CBG: No results found for this basename: GLUCAP,  in the last 168 hours  Radiological Exams on Admission: Dg Thoracic Spine 2 View  11/22/2012   *RADIOLOGY REPORT*  Clinical Data: Fall with back pain.  THORACIC SPINE - 2 VIEW  Comparison: Chest CT on 06/28/2012  Findings: Mild degenerative changes are seen throughout the thoracic spine.  No acute fracture or subluxation is identified. No bony lesions are seen.  IMPRESSION: No evidence of thoracic fracture.   Original Report Authenticated By: Irish Lack, M.D.   Dg Lumbar Spine Complete  11/22/2012   *RADIOLOGY REPORT*  Clinical Data: Fall with low back pain.  LUMBAR SPINE - COMPLETE 4+ VIEW  Comparison: 02/02/2004  Findings: There is stable minimal compression of the superior endplate of L2.  There is some progression of degenerative disc disease at L2-3, L3-4 and L4-5.  No acute fracture or subluxation is identified.  No bony lesions are seen.  IMPRESSION: No acute fracture.  Stable mild compression of L2.  Progressive degenerative disc disease of the lumbar spine.   Original Report Authenticated By: Irish Lack, M.D.   Dg Sacrum/coccyx  11/22/2012   *RADIOLOGY REPORT*  Clinical Data: Fall with lower back and coccyx pain.  SACRUM AND COCCYX - 2+ VIEW  Comparison: None.  Findings: No evidence of sacral fracture.  The coccyx appears normally aligned.  Degenerative changes  are visualized in the lower lumbar spine.  The sacroiliac joints are symmetric bilaterally and show no abnormal widening.  IMPRESSION: No evidence of sacral or coccygeal fracture.   Original Report Authenticated By: Irish Lack, M.D.    EKG: None  Assessment/Plan Principal Problem:   Dehydration Active Problems:   SYSTOLIC HEART FAILURE, CHRONIC   Hypertension   Lung cancer, lower lobe   Nausea & vomiting   Hypokalemia   Anemia of chronic disease   Radiation esophagitis   Fall   Acute on chronic kidney failure  #1 dehydration Secondary to poor oral intake secondary to radiation esophagitis. Patient also on diuretics 4 history of CHF. Patient also with some episodes of emesis. Will check an acute abdominal series. Urinalysis is pending. CBC is a normal white count. Hydrate with IV fluids. Supportive care. Clear liquids.  #2 acute on chronic kidney failure Likely secondary to prerenal azotemia in the setting of diuretics and ARB. Patient's creatinine is close to her baseline. Will check a urine sodium. Check a urine creatinine. Hydrate gently with IV fluids. Hold diuretics. Follow. If no improvement will need a renal ultrasound.  #3 hypokalemia Likely secondary to GI losses in the setting of diuretics. Check a magnesium level. Replete.  #4 nausea or vomiting Likely secondary to radiation esophagitis. Check a KUB. Follow.  #5 radiation esophagitis Patient with recent EGD 11/05/2012 showing severe ulcerative esophagitis likely secondary to radiation. Continue Carafate, PPI twice a day, Diflucan, Magic mouthwash. May need to consult with GI as patient shows no significant improvement with poor oral intake.  #6 fall Likely mechanical fall secondary to generalized weakness. Patient denies any syncopal episodes. Follow. Physical therapy. Occupational therapy. I #7 anemia of chronic disease H&H stable.  #8 chronic systolic  heart failure/status post ICD/nonischemic  cardiomyopathy Stable. Patient appears compensated. Will hold diuretics for now secondary to problem #1. May resume once patient is euvolemic. Continue Coreg. Monitor fluid status.  #9 hypertension Hold diuretics. Continue Coreg.  #10 history of non-small cell lung cancer Stable. Followup as outpatient.  #11 COPD Stable. Continue nebs.  #12 Back pain Plain films negative for acute fracture. Pain management. PT/OT  #13 prophylaxis PPI for GI prophylaxis. Lovenox for DVT prophylaxis.   Code Status: DNR Family Communication: Updated son at bedside. Disposition Plan: admit to MedSurg floor.  Time spent: 65 mins  [facility] Pager [phone]  If 7PM-7AM, please contact night-coverage [email or web address] Password [facility] 11/22/2012, 8:43 PM

## 2012-11-22 NOTE — ED Notes (Signed)
Per [facility], Pt, from [facility], c/o generalized pain, after a fall x 2 days ago and abdominal pain.  Pain score 6/10.  No deformities noted.  Pt sts that she is supposed to have an endoscopy "sometime."  However, facility wants Pt evaluted for GI upset.  Vitals are stable.  A & Ox4.

## 2012-11-22 NOTE — ED Notes (Signed)
MD at bedside. 

## 2012-11-22 NOTE — ED Notes (Signed)
Patient transported to X-ray 

## 2012-11-22 NOTE — Progress Notes (Signed)
CSW met with pt at bedside. Pts son, Renae Fickle is at her bedside.  He has healthcare HCPOA for pt.   Pt is a resident of [facility] and plans for her to return when she is medically stable.    Marva Panda, LCSWA  [phone] 11/22/12  6:15 pm

## 2012-11-22 NOTE — ED Provider Notes (Signed)
CSN: [id number]     Arrival date & time 11/22/12  1423 History     First MD Initiated Contact with Patient 11/22/12 [phone]     Chief Complaint  Patient presents with  . generalized pain   . Abdominal Pain   (Consider location/radiation/quality/duration/timing/severity/associated sxs/prior Treatment) HPI Comments: Pt comes in with cc of back pain. Pt has hx of lung CA, s/p radiaton. States that she has esophageal ulcers and due to that pain with swallowing and decreased po intake. Pt had a fall Saturday night, she suspects that the fall was due to "hallucinations" or "dreams" as she never walks - but fell while attempting to walk. She has pain in the sacrum region and her back. Pt denies nausea, emesis, fevers, chills, chest pains, shortness of breath, headaches, abdominal pain, uti like symptoms. Pt has generalized weakness.    Patient is a 80 y.o. female presenting with abdominal pain. The history is provided by the patient.  Abdominal Pain Pertinent negatives include no chest pain, no abdominal pain and no shortness of breath.    Past Medical History  Diagnosis Date  . LBBB (left bundle branch block)   . Nonischemic cardiomyopathy     EF 25-30% echo 2010, 45% echo 2012  . Hypertension   . Diverticulitis   . ICD (implantable cardiac defibrillator) in place     CRT-D [employer]  . Chronic fatigue   . Fibromyalgia   . Shortness of breath     followed by Dr. Shelle Iron  . Elevated uric acid   . CRI (chronic renal insufficiency)   . Abdominal bloating   . Osteopenia   . CHF (congestive heart failure)   . Arthritis   . COPD (chronic obstructive pulmonary disease)   . Asthma   . Non-small cell lung cancer    Past Surgical History  Procedure Laterality Date  . Crt-d-st. jude's    . Replacement total knee    . Abdominal hysterectomy    . Appendectomy    . Neck surgery    . Tonsillectomy    . Neck surgery  02/2007    cervical  . Laser surgery left eye  2013  . Oral  surgery/infection in root of teeth  2013  . Video bronchoscopy Bilateral 08/12/2012    Procedure: VIDEO BRONCHOSCOPY WITHOUT FLUORO;  Surgeon: Barbaraann Share, MD;  Location: [facility];  Service: Cardiopulmonary;  Laterality: Bilateral;  . Esophagogastroduodenoscopy N/A 11/05/2012    Procedure: ESOPHAGOGASTRODUODENOSCOPY (EGD);  Surgeon: Vertell Novak., MD;  Location: [facility];  Service: Endoscopy;  Laterality: N/A;   Family History  Problem Relation Age of Onset  . Heart disease Son   . Colon cancer Sister   . Lung cancer Father   . Heart disease Mother   . Heart disease Brother   . Hypertension Mother   . Diabetes Brother    History  Substance Use Topics  . Smoking status: Never Smoker   . Smokeless tobacco: Never Used  . Alcohol Use: No   OB History   Grav Para Term Preterm Abortions TAB SAB Ect Mult Living   3 3        2      Review of Systems  Constitutional: Positive for fatigue. Negative for fever, chills and activity change.  HENT: Negative for facial swelling and neck pain.   Respiratory: Negative for cough, shortness of breath and wheezing.   Cardiovascular: Negative for chest pain.  Gastrointestinal: Negative for nausea, vomiting, abdominal pain, diarrhea,  constipation, blood in stool and abdominal distention.  Genitourinary: Negative for hematuria and difficulty urinating.  Musculoskeletal: Positive for back pain.  Skin: Negative for color change and wound.  Neurological: Positive for weakness. Negative for speech difficulty and light-headedness.  Hematological: Does not bruise/bleed easily.  Psychiatric/Behavioral: Negative for confusion.    Allergies  Bacitracin; Imuran; Metoclopramide hcl; Nabumetone; Neomycin-bacitracin zn-polymyx; Nitroglycerin; Nsaids; Nutritional supplements; Other; Plaquenil; and Tizanidine  Home Medications   Current Outpatient Rx  Name  Route  Sig  Dispense  Refill  . carvedilol (COREG) 3.125 MG tablet   Oral   Take 1  tablet (3.125 mg total) by mouth 2 (two) times daily with a meal.   180 tablet   6   . furosemide (LASIX) 40 MG tablet      Take 2 pills (80 mg) two times per day   90 tablet   3   . gabapentin (NEURONTIN) 300 MG capsule   Oral   Take 300 mg by mouth 2 (two) times daily.          . Glucosamine-Chondroitin (OSTEO BI-FLEX REGULAR STRENGTH PO)   Oral   Take 2 tablets by mouth daily.           Marland Kitchen HYDROcodone-acetaminophen (NORCO) 10-325 MG per tablet   Oral   Take 1 tablet by mouth every 4 (four) hours as needed for pain.   45 tablet   0   . losartan (COZAAR) 25 MG tablet   Oral   Take 0.5 tablets (12.5 mg total) by mouth 2 (two) times daily.   90 tablet   3   . NON FORMULARY      Mega Red daily          . oxybutynin (DITROPAN) 5 MG tablet   Oral   Take 5 mg by mouth daily.           . pantoprazole (PROTONIX) 40 MG tablet   Oral   Take 1 tablet (40 mg total) by mouth 2 (two) times daily.   60 tablet   3   . potassium chloride 20 MEQ TBCR   Oral   Take 20 mEq by mouth daily.   30 tablet   0   . pravastatin (PRAVACHOL) 40 MG tablet   Oral   Take 40 mg by mouth daily.           . Acetaminophen (TYLENOL EXTRA STRENGTH PO)   Oral   Take 650 mg by mouth as needed.         Marland Kitchen albuterol (PROVENTIL) (5 MG/ML) 0.5% nebulizer solution   Nebulization   Take 0.5 mLs (2.5 mg total) by nebulization every 4 (four) hours as needed for wheezing or shortness of breath.   20 mL   12   . Alum & Mag Hydroxide-Simeth (MAGIC MOUTHWASH W/LIDOCAINE) SOLN      1part nystatin,1part Maaloxplus,1part benadryl,3part 2%viscous lidocaine. Swallow 10 mL up to QID, before meals/bedtime.  Called in [facility] [address] for refill on 10/15/2012 with 3 refills.         . Ascorbic Acid (VITAMIN C) 500 MG tablet   Oral   Take 1,000 mg by mouth daily.          . Calcium Citrate-Vitamin D (CALCIUM CITRATE + D PO)   Oral   Take by mouth daily.          . diazepam  (VALIUM) 5 MG tablet   Oral   Take 1 tablet (5 mg  total) by mouth every 6 (six) hours as needed.   30 tablet   0   . diclofenac (FLECTOR) 1.3 % PTCH   Transdermal   Place 1 patch onto the skin 2 (two) times daily.         Marland Kitchen docusate sodium (COLACE) 100 MG capsule      daily.          . feeding supplement (RESOURCE BREEZE) LIQD   Oral   Take 1 Container by mouth 3 (three) times daily between meals.   1 Container   0   . fluconazole (DIFLUCAN) 10 MG/ML suspension   Oral   Take 20 mLs (200 mg total) by mouth daily.   35 mL   3   . formoterol (FORADIL AEROLIZER) 12 MCG capsule for inhaler   Inhalation   Place 1 capsule (12 mcg total) into inhaler and inhale 2 (two) times daily.   60 capsule   6   . hyaluronate sodium (RADIAPLEXRX) GEL   Topical   Apply 1 application topically 2 (two) times daily.         . magnesium hydroxide (MILK OF MAGNESIA) 400 MG/5ML suspension   Oral   Take 5 mLs by mouth daily as needed for constipation.         Marland Kitchen morphine 20 MG/5ML solution   Oral   Take 0.6 mLs (2.4 mg total) by mouth every 2 (two) hours as needed for pain.   100 mL   0   . Multiple Vitamin (MULTIVITAMIN) capsule   Oral   Take 1 capsule by mouth daily.           . Multiple Vitamin (MULTIVITAMIN) LIQD   Oral   Take 5 mLs by mouth daily.   1 Bottle   0   . nystatin (MYCOSTATIN) 100000 UNIT/ML suspension   Oral   Take 5 mLs (500,000 Units total) by mouth 4 (four) times daily.   60 mL   0   . ondansetron (ZOFRAN) 8 MG tablet   Oral   Take 1 tablet (8 mg total) by mouth every 8 (eight) hours as needed for nausea.   20 tablet   0   . polyethylene glycol powder (MIRALAX) powder   Oral   Take 17 g by mouth daily.   255 g   5   . Psyllium (VEGETABLE LAXATIVE PO)      as needed.           . sucralfate (CARAFATE) 1 GM/10ML suspension   Oral   Take 10 mLs (1 g total) by mouth 4 (four) times daily -  with meals and at bedtime.   420 mL   0   .  triamcinolone cream (KENALOG) 0.1 %      As needed         . Vitamin D, Ergocalciferol, (DRISDOL) 50000 UNITS CAPS   Oral   Take 50,000 Units by mouth every 7 (seven) days.           Marland Kitchen zolpidem (AMBIEN) 5 MG tablet   Oral   Take 1 tablet (5 mg total) by mouth at bedtime.   30 tablet   0    BP 94/59  Pulse 69  Temp(Src) 98.7 F (37.1 C) (Oral)  Resp 19  SpO2 94% Physical Exam  Nursing note and vitals reviewed. Constitutional: She is oriented to person, place, and time. She appears well-developed and well-nourished.  HENT:  Head: Normocephalic and atraumatic.  Eyes: EOM  are normal. Pupils are equal, round, and reactive to light.  Neck: Neck supple.  Cardiovascular: Normal rate, regular rhythm and normal heart sounds.   No murmur heard. Pulmonary/Chest: Effort normal. No respiratory distress.  Abdominal: Soft. She exhibits no distension. There is no tenderness. There is no rebound and no guarding.  Musculoskeletal:  Pt has tenderness over the lower thoracic, lumbar and sacral region No step offs, no erythema. Pt has 2+ patellar reflex bilaterally. Able to discriminate between sharp and dull.    Neurological: She is alert and oriented to person, place, and time.  Skin: Skin is warm and dry.    ED Course   Procedures (including critical care time)  Labs Reviewed  URINE CULTURE  URINALYSIS, ROUTINE W REFLEX MICROSCOPIC  CBC WITH DIFFERENTIAL  BASIC METABOLIC PANEL   No results found. No diagnosis found.  MDM  Pt comes in post fall  DDx includes: - Mechanical falls - ICH - Fractures - Contusions - Soft tissue injury   Pt has focal back tenderness, and we will get appropriate imaging. Fortunately, there are no red flags on hx and exam to suspect cord injury.  Derwood Kaplan, MD 11/22/12 1627

## 2012-11-23 DIAGNOSIS — E43 Unspecified severe protein-calorie malnutrition: Secondary | ICD-10-CM | POA: Insufficient documentation

## 2012-11-23 DIAGNOSIS — D696 Thrombocytopenia, unspecified: Secondary | ICD-10-CM | POA: Diagnosis present

## 2012-11-23 DIAGNOSIS — D638 Anemia in other chronic diseases classified elsewhere: Secondary | ICD-10-CM

## 2012-11-23 LAB — URINALYSIS, ROUTINE W REFLEX MICROSCOPIC
Bilirubin Urine: NEGATIVE
Glucose, UA: NEGATIVE mg/dL
Ketones, ur: NEGATIVE mg/dL
Nitrite: POSITIVE — AB
Protein, ur: NEGATIVE mg/dL
Specific Gravity, Urine: 1.019 (ref 1.005–1.030)
Urobilinogen, UA: 0.2 mg/dL (ref 0.0–1.0)
pH: 5.5 (ref 5.0–8.0)

## 2012-11-23 LAB — CBC
Hemoglobin: 11.9 g/dL — ABNORMAL LOW (ref 12.0–15.0)
MCH: 30.5 pg (ref 26.0–34.0)
MCHC: 32.4 g/dL (ref 30.0–36.0)
MCV: 94.1 fL (ref 78.0–100.0)
Platelets: 80 10*3/uL — ABNORMAL LOW (ref 150–400)
RBC: 3.9 MIL/uL (ref 3.87–5.11)

## 2012-11-23 LAB — URINE MICROSCOPIC-ADD ON

## 2012-11-23 LAB — BASIC METABOLIC PANEL
BUN: 26 mg/dL — ABNORMAL HIGH (ref 6–23)
CO2: 27 mEq/L (ref 19–32)
Calcium: 9.2 mg/dL (ref 8.4–10.5)
Glucose, Bld: 99 mg/dL (ref 70–99)
Sodium: 141 mEq/L (ref 135–145)

## 2012-11-23 MED ORDER — DEXTROSE 5 % IV SOLN
1.0000 g | INTRAVENOUS | Status: DC
  Administered 2012-11-23 – 2012-11-26 (×4): 1 g via INTRAVENOUS
  Filled 2012-11-23 (×4): qty 10

## 2012-11-23 MED ORDER — FLUCONAZOLE IN SODIUM CHLORIDE 200-0.9 MG/100ML-% IV SOLN
200.0000 mg | INTRAVENOUS | Status: DC
  Administered 2012-11-23 – 2012-11-25 (×3): 200 mg via INTRAVENOUS
  Filled 2012-11-23 (×4): qty 100

## 2012-11-23 MED ORDER — FLUCONAZOLE IN SODIUM CHLORIDE 400-0.9 MG/200ML-% IV SOLN: 400.0000 mg | Freq: Every day | INTRAVENOUS | Status: DC

## 2012-11-23 MED ORDER — MAGIC MOUTHWASH W/LIDOCAINE
5.0000 mL | Freq: Four times a day (QID) | ORAL | Status: DC
  Administered 2012-11-23 – 2012-11-25 (×10): 5 mL via ORAL
  Filled 2012-11-23 (×12): qty 5

## 2012-11-23 MED ORDER — DEXTROSE 5 % IV SOLN
350.0000 mg | Freq: Two times a day (BID) | INTRAVENOUS | Status: DC
  Administered 2012-11-23 – 2012-11-26 (×7): 350 mg via INTRAVENOUS
  Filled 2012-11-23 (×9): qty 7

## 2012-11-23 MED ORDER — ADULT MULTIVITAMIN W/MINERALS CH
1.0000 | ORAL_TABLET | Freq: Every day | ORAL | Status: DC
  Administered 2012-11-24: 1 via ORAL
  Filled 2012-11-23 (×4): qty 1

## 2012-11-23 NOTE — Progress Notes (Signed)
ANTIBIOTIC CONSULT NOTE - INITIAL  Pharmacy Consult for Acyclovir Indication: Radiation esophagitis  Allergies  Allergen Reactions  . Other Other (See Comments)    No blood products - pt is Jehovah Witness  . Bacitracin Other (See Comments)    Per pt MAR  . Gelatin Other (See Comments)    Per pt MAR  . Imuran (Azathioprine) Other (See Comments)    Per pt MAR  . Methylsulfonylmethane Other (See Comments)    Per pt MAR  . Metoclopramide Hcl Other (See Comments)    Per pt MAR  . Nabumetone Other (See Comments)    Per pt MAR  . Neomycin-Bacitracin Zn-Polymyx Other (See Comments)    Per pt MAR  . Nitroglycerin Other (See Comments)    Per pt MAR  . Nsaids Other (See Comments)    Per pt MAR  . Nutritional Supplements Other (See Comments)    Glycernat - per pt MAR  . Plaquenil (Hydroxychloroquine Sulfate) Other (See Comments)    Per pt MAR  . Tizanidine     Lowers blood pressure   Patient Measurements: Height: 5\' 3"  (160 cm) Weight: 151 lb 12.8 oz (68.856 kg) IBW/kg (Calculated) : 52.4  Vital Signs: Temp: 98.6 F (37 C) (07/29 0541) Temp src: Oral (07/29 0541) BP: 125/56 mmHg (07/29 0541) Pulse Rate: 62 (07/29 0541) Intake/Output from previous day: 07/28 0701 - 07/29 0700 In: -  Out: 800 [Urine:800] Intake/Output from this shift: Total I/O In: 820 [P.O.:60; I.V.:760] Out: -   Labs:  Recent Labs  11/22/12 1723 11/23/12 0400  WBC 7.0 7.2  HGB 12.3 11.9*  PLT PLATELET CLUMPS NOTED ON SMEAR, COUNT APPEARS DECREASED 80*  CREATININE 1.52* 1.31*   Estimated Creatinine Clearance: 30.8 ml/min (by C-G formula based on Cr of 1.31). No results found for this basename: VANCOTROUGH, Leodis Binet, VANCORANDOM, GENTTROUGH, GENTPEAK, GENTRANDOM, TOBRATROUGH, TOBRAPEAK, TOBRARND, AMIKACINPEAK, AMIKACINTROU, AMIKACIN,  in the last 72 hours   Microbiology: Recent Results (from the past 720 hour(s))  URINE CULTURE     Status: None   Collection Time    11/02/12  6:29 PM   Result Value Range Status   Specimen Description URINE, RANDOM   Final   Special Requests NONE   Final   Culture  Setup Time 11/03/2012 01:19   Final   Colony Count >=100,000 COLONIES/ML   Final   Culture ESCHERICHIA COLI   Final   Report Status 11/04/2012 FINAL   Final   Organism ID, Bacteria ESCHERICHIA COLI   Final   Medical History: Past Medical History  Diagnosis Date  . LBBB (left bundle branch block)   . Nonischemic cardiomyopathy     EF 25-30% echo 2010, 45% echo 2012  . Hypertension   . Diverticulitis   . ICD (implantable cardiac defibrillator) in place     CRT-D [employer]  . Chronic fatigue   . Fibromyalgia   . Shortness of breath     followed by Dr. Shelle Iron  . Elevated uric acid   . CRI (chronic renal insufficiency)   . Abdominal bloating   . Osteopenia   . CHF (congestive heart failure)   . Arthritis   . COPD (chronic obstructive pulmonary disease)   . Asthma   . Non-small cell lung cancer    Medications:  Anti-infectives   Start     Dose/Rate Route Frequency Ordered Stop   11/23/12 2200  fluconazole (DIFLUCAN) IVPB 400 mg  Status:  Discontinued     400 mg 100 mL/hr over  120 Minutes Intravenous Daily at bedtime 11/23/12 1000 11/23/12 1008   11/23/12 2200  fluconazole (DIFLUCAN) IVPB 200 mg     200 mg 100 mL/hr over 60 Minutes Intravenous Every 24 hours 11/23/12 1009     11/23/12 1100  acyclovir (ZOVIRAX) 350 mg in dextrose 5 % 100 mL IVPB     350 mg 107 mL/hr over 60 Minutes Intravenous Every 12 hours 11/23/12 1021     11/23/12 0800  cefTRIAXone (ROCEPHIN) 1 g in dextrose 5 % 50 mL IVPB     1 g 100 mL/hr over 30 Minutes Intravenous Every 24 hours 11/23/12 0657     11/22/12 2230  fluconazole (DIFLUCAN) IVPB 200 mg  Status:  Discontinued     200 mg 100 mL/hr over 60 Minutes Intravenous Daily at bedtime 11/22/12 2226 11/23/12 1000   11/22/12 2200  fluconazole (DIFLUCAN) tablet 200 mg  Status:  Discontinued     200 mg Oral Daily at bedtime 11/22/12 2128  11/22/12 2226   11/22/12 2100  fluconazole (DIFLUCAN) 10 MG/ML suspension 200 mg  Status:  Discontinued     200 mg Oral Daily 11/22/12 2051 11/22/12 2127     Assessment: 36 yoF with radiation esophagitis s/p treatment of lung Ca. Decreased oral intake, dehydration, acute on chronic renal failure. Oral fluconazole at home from 7/14.  Admit for hydration, continue Fluconazole IV, add Acyclovir iv per pharmacy  Renal dysfunction: SCr decreasing.  Urine cx ordered-on Ceftriaxone  Goal of Therapy:   Antiinfective agent(s) dosed appropriately for renal function, treatment of infection.  Plan:  Fluconazole to continue at 200mg  IV daily (dose reduction from 400mg ) Add Acyclovir 350mg  IV q12 Follow renal function closely, adjust medications as appropriate.  Otho Bellows PharmD Pager [phone] 11/23/2012, 10:33 AM

## 2012-11-23 NOTE — Care Management Note (Signed)
    Page 1 of 1   11/23/2012     3:30:59 PM   CARE MANAGEMENT NOTE 11/23/2012  Patient:  Heather Bullock, Heather Bullock   Account Number:  [id number]  Date Initiated:  11/23/2012  Documentation initiated by:  Lanier Clam  Subjective/Objective Assessment:   ADMITTED W/BACK PAIN.RADIATION ESOPHAGITIS.ZO:XWRU CA.     Action/Plan:   FROM SNF,BUT WANTS NEW SNF.   Anticipated DC Date:  11/26/2012   Anticipated DC Plan:  SKILLED NURSING FACILITY      DC Planning Services  CM consult      Choice offered to / List presented to:             Status of service:  In process, will continue to follow Medicare Important Message given?   (If response is "NO", the following Medicare IM given date fields will be blank) Date Medicare IM given:   Date Additional Medicare IM given:    Discharge Disposition:    Per UR Regulation:  Reviewed for med. necessity/level of care/duration of stay  If discussed at Long Length of Stay Meetings, dates discussed:    Comments:  11/23/12 Alan Drummer RN,BSN NCM [phone] PT-SNF.GI CONS.RD-MALNUTRITION.CSW FOLLOWING FOR NEW SNF.

## 2012-11-23 NOTE — Progress Notes (Signed)
Physical Therapy Treatment Patient Details Name: Jonna Dittrich MRN: [id number] DOB: 16-Aug-1928 Today's Date: 11/23/2012 Time: 5366-4403 PT Time Calculation (min): 10 min  PT Assessment / Plan / Recommendation  History of Present Illness pt limited by pain and nausea   Clinical Impression Pt with decreased activity tolerance   PT Comments     Follow Up Recommendations  SNF     Does the patient have the potential to tolerate intense rehabilitation     Barriers to Discharge   pt reports she does not want to go back to [employer] walker with 5" wheels    Recommendations for Other Services OT consult  Frequency Min 3X/week   Progress towards PT Goals Progress towards PT goals: Progressing toward goals  Plan Current plan remains appropriate    Precautions / Restrictions     Pertinent Vitals/Pain Pain in tailbone and esophagus    Mobility  Bed Mobility Bed Mobility: Rolling Right;Sit to Supine Supine to Sit: 4: Min assist Sitting - Scoot to Edge of Bed: 4: Min assist Sit to Supine: 4: Min assist Details for Bed Mobility Assistance: pt wants to sit on a pillow in the bed to relieve pain at tailbone Transfers Transfers: Sit to Stand;Stand to Sit Sit to Stand: 4: Min assist;From chair/3-in-1 Stand to Sit: 4: Min assist;To chair/3-in-1 Details for Transfer Assistance: Pt limited by pain in back, tailbone area, esopageal burning Pt to bedside commode to void prior to return to bed Ambulation/Gait Ambulation/Gait Assistance: 4: Min assist Ambulation Distance (Feet): 4 Feet Ambulation/Gait Assistance Details: safety Gait Pattern: Trunk flexed;Decreased step length - right;Decreased step length - left Gait velocity: decreased General Gait Details: Pt appears very uncomfortable Stairs: No Wheelchair Mobility Wheelchair Mobility: No    Exercises General Exercises - Lower Extremity Ankle Circles/Pumps: AROM;Both;10 reps;Supine Quad Sets:  AROM;Both;10 reps;Supine Gluteal Sets: AROM;Both;Standing Long Arc Quad: AROM;Both;5 reps;Seated   PT Diagnosis: Difficulty walking;Acute pain;Generalized weakness  PT Problem List: Decreased activity tolerance;Decreased mobility PT Treatment Interventions: Gait training;Functional mobility training;Therapeutic exercise;Therapeutic activities;Patient/family education   PT Goals (current goals can now be found in the care plan section) Acute Rehab PT Goals Patient Stated Goal: to get rid of back and tailbone pain PT Goal Formulation: With patient Time For Goal Achievement: 11/30/12 Potential to Achieve Goals: Good  Visit Information  Last PT Received On: 11/23/12 Assistance Needed: +1 History of Present Illness: pt limited by pain and nausea    Subjective Data  Patient Stated Goal: to get rid of back and tailbone pain   Cognition  Cognition Arousal/Alertness: Awake/alert Behavior During Therapy: WFL for tasks assessed/performed Overall Cognitive Status: Within Functional Limits for tasks assessed    Balance  Static Sitting Balance Static Sitting - Balance Support: Feet supported;Bilateral upper extremity supported Static Sitting - Level of Assistance: 7: Independent  End of Session PT - End of Session Activity Tolerance: Patient limited by pain;Patient limited by fatigue Patient left: in bed;with call bell/phone within reach Nurse Communication: Mobility status   GP     Donnetta Hail 11/23/2012, 12:08 PM

## 2012-11-23 NOTE — Progress Notes (Signed)
[facility] PROGRESS NOTE  Heather Bullock ZOX:[id number] DOB: 09/07/1928 DOA: 11/22/2012 PCP: Thora Lance, MD  Brief narrative: 79 y.o. female with past medical history of non-small cell lung cancer status post radiation therapy, related radiation ulcerative esophagitis (and recent admission 11/02/2012 for this problem of radiatio esophagitis), systolic CHF status post cardiac defibrillator placement (2 D ECHO in 06/2012 with EF 25-30%, chronic renal insufficiency who presented to [facility] ED 11/22/2012 with status post fall in nursing home 2 days prior to this admission.Patient reports generalized pain since the fall. In addition, she still has persistent pain on swallowing since last admission. In ED, BP was 94/59 but it has improved to 125/56 with IV fluids. HR was 61, O2 saturation 93% on room air and T max 98.2 F. X ray of lumbar and thoracic spine were negative for acute fractures. No acute findings on abdominal x ray. Her CBC revealed hemoglobin of 11.9 and platelet count of 80. BMP revealed creatinine of 1.52.  Assessment/Plan:   Principal Problem:  Radiation esophagitis, ulcerative esophagitis - in patient with history of lung cancer and status post radiation therapy - EGD was done on previous admission, 11/05/2012 with findings of severe ulcerative esophagitis and recommendation was for aggressive PPI therapy and Sucralfate - we will continue protonix 40 mg every 12 hours and sucralfate  - continue fluconazole 200 mg IV daily - start acyclovir for possible herpes simplex virus esophagitis - continue magic mouthwash with lidocaine and nystatin swish and swallow. - Diet advanced as tolerated  - pain management with morphine 2 mg IV every 2 hours PRN severe pain, morphine 2.5 mg PO Q 2 hours PRN moderate pain and norco for breakthrough pain  Active Problems:  Hypertension  - Continue metoprolol 2.5 mg IV every 8 hours and switch to PO once pt able to have better PO intake   Lung cancer,  lower lobe  - Management per radiation oncology  - has completed RT 10/27/2012 under the care of Dr. Mitzi Hansen UTI (urinary tract infection) secondary to Escherichia coli  - rocephin started on admission - follow up results of urine culture Acute renal failure  - Likely related to urinary tract infection, dehydration - creatinine trending down, 1.52 --> 1.31 - continue current IV fluids, NS with potassium supplementation at 75 cc/hr Hypokalemia  - Secondary to GI losses; potassium being repleted in IV fluids - Potassium within normal limits  Anemia of chronic disease  - secondary to history of malignancy  - hemoglobin stable at 11.9 Thrombocytopenia - one value for platelet count is 80; we will recheck in am to make sure not a lab error - will use SCD's for DVT prophylaxis Moderate protein calorie malnutrition - nutrition consulted  Code Status: DNR/DNI  Family Communication: no family at the bedside   [name], MD  [facility]  Pager [phone]   Consultants:  None  Procedures:  None  Antibiotics:   Acyclovir for HSV esophagitis 11/23/2012 -->  Fluconazole for yeast esophagitis  11/22/2012 -->  Rocephin for UTI  11/22/2012 -->    If 7PM-7AM, please contact night-coverage [email or web address] Password [email or web address] 11/23/2012, 7:05 AM   LOS: 1 day     HPI/Subjective: Complains of severe burning sensation in esophageal and upper abdominal area. Poor oral intake.  Objective: Filed Vitals:   11/22/12 1447 11/22/12 1752 11/22/12 2055 11/23/12 0541  BP: 94/59 115/56 112/54 125/56  Pulse: 69 61 63 62  Temp: 98.7 F (37.1 C) 98.9 F (37.2 C) 98.2 F (36.8 C) 98.6 F (  37 C)  TempSrc: Oral Oral Oral Oral  Resp: 19 13 18 18   Height:   5\' 3"  (1.6 m)   Weight:   68.947 kg (152 lb) 68.856 kg (151 lb 12.8 oz)  SpO2: 94% 95% 96% 93%    Intake/Output Summary (Last 24 hours) at 11/23/12 0705 Last data filed at 11/23/12 0542  Gross per 24 hour  Intake      0 ml  Output    800 ml  Net    -800 ml    Exam:   General:  Pt is alert, follows commands appropriately, not in acute distress  Cardiovascular: Regular rate and rhythm, S1/S2 appreciated  Respiratory: Clear to auscultation bilaterally, no wheezing, no crackles, no rhonchi  Abdomen: Soft, tender in mid abdomen, non distended, bowel sounds present, no guarding  Extremities: No edema, pulses DP and PT palpable bilaterally  Neuro: Grossly nonfocal  Data Reviewed: Basic Metabolic Panel:  Recent Labs Lab 11/22/12 1723 11/23/12 0400  NA 138 141  K 3.2* 3.9  CL 96 103  CO2 32 27  GLUCOSE 103* 99  BUN 30* 26*  CREATININE 1.52* 1.31*  CALCIUM 9.4 9.2  MG 2.5  --    Liver Function Tests: No results found for this basename: AST, ALT, ALKPHOS, BILITOT, PROT, ALBUMIN,  in the last 168 hours No results found for this basename: LIPASE, AMYLASE,  in the last 168 hours No results found for this basename: AMMONIA,  in the last 168 hours CBC:  Recent Labs Lab 11/22/12 1723 11/23/12 0400  WBC 7.0 7.2  NEUTROABS 4.3  --   HGB 12.3 11.9*  HCT 38.0 36.7  MCV 93.8 94.1  PLT PLATELET CLUMPS NOTED ON SMEAR, COUNT APPEARS DECREASED 80*   Cardiac Enzymes: No results found for this basename: CKTOTAL, CKMB, CKMBINDEX, TROPONINI,  in the last 168 hours BNP: No components found with this basename: POCBNP,  CBG: No results found for this basename: GLUCAP,  in the last 168 hours  No results found for this or any previous visit (from the past 240 hour(s)).   Studies: Dg Thoracic Spine 2 View 11/22/2012   * IMPRESSION: No evidence of thoracic fracture.   Original Report Authenticated By: Irish Lack, M.D.   Dg Lumbar Spine Complete 11/22/2012   IMPRESSION: No acute fracture.  Stable mild compression of L2.  Progressive degenerative disc disease of the lumbar spine.   Original Report Authenticated By: Irish Lack, M.D.   Dg Sacrum/coccyx 11/22/2012     IMPRESSION: No evidence of sacral or coccygeal fracture.    Original Report Authenticated By: Irish Lack, M.D.   Dg Abd Acute W/chest 11/22/2012     IMPRESSION: 1.  No acute abnormality. 2.  Left lower lobe collapse / consolidation appears chronic, similar to prior examinations. 3.  Nonobstructive bowel gas pattern.   Original Report Authenticated By: Andreas Newport, M.D.    Scheduled Meds: . cefTRIAXone (ROCEPHIN)  IV  1 g Intravenous Q24H  . diclofenac  1 patch Transdermal BID  . docusate sodium  100 mg Oral Daily  . feeding supplement  1 Container Oral TID BM  . fluconazole (DIFLUCAN)   200 mg Intravenous QHS  . gabapentin  300 mg Oral BID  . magic mouthwash w/lidocaine  10 mL Oral QID  . metoprolol  2.5 mg Intravenous Q8H  . nystatin  500,000 Units Oral QID  . ondansetron  4 mg Intravenous Q8H  . oxybutynin  5 mg Oral Daily  . pantoprazole (PROTONIX) IV  40 mg Intravenous Q12H  . polyethylene glycol  17 g Oral Daily  . potassium chloride  20 mEq Oral Daily  . salmeterol  1 puff Inhalation Q12H  . simvastatin  20 mg Oral q1800  . sucralfate  1 g Oral TID WC & HS  . zolpidem  5 mg Oral QHS   Continuous Infusions: . 0.9 % NaCl with KCl 40 mEq / L 75 mL/hr at 11/22/12 2250

## 2012-11-23 NOTE — Progress Notes (Signed)
Clinical Social Work Department BRIEF PSYCHOSOCIAL ASSESSMENT 11/23/2012  Patient:  Heather Bullock, Heather Bullock     Account Number:  [id number]     Admit date:  11/22/2012  Clinical Social Worker:  Lovenia Shuck  Date/Time:  11/22/2012 10:33 PM  Referred by:  CSW  Date Referred:  11/22/2012 Referred for  SNF Placement   Other Referral:   Interview type:  Patient Other interview type:   Family    PSYCHOSOCIAL DATA Living Status:  FACILITY Admitted from facility:  [facility] Level of care:  Skilled Nursing Facility Primary support name:  Tim Lair [phone] Primary support relationship to patient:  CHILD, ADULT Degree of support available:   Strong  Son is HCPOA    CURRENT CONCERNS Current Concerns  Post-Acute Placement   Other Concerns:    SOCIAL WORK ASSESSMENT / PLAN CSW met with pt at bedside.  Pt confirmed that she is a resident of [place] and plans to return when medically stable.   Pts son was at her bedside.   Assessment/plan status:  No Further Intervention Required Other assessment/ plan:   Information/referral to community resources:   None identified at this time.    PATIENT'S/FAMILY'S RESPONSE TO PLAN OF CARE: Pt and son thanked CSW for concern and support.  Plans to return to [facility] when medically stable.

## 2012-11-23 NOTE — Evaluation (Signed)
Occupational Therapy Evaluation Patient Details Name: Heather Bullock MRN: [id number] DOB: 12/29/1928 Today's Date: 11/23/2012 Time: 7829-5621 OT Time Calculation (min): 23 min  OT Assessment / Plan / Recommendation History of present illness Heather Bullock is a 79 y.o. female with history of lung cancer status post radiation therapy, recently diagnosed radiation esophagitis leading to decreased oral intake, history of nonischemic cardiomyopathy EF of 25-30% per 2-D echo 07/26/12, hypertension, status post ICD, chronic renal insufficiency, history of CHF presented to the ED after a fall. Patient states that she fell 2 days prior to admission when she got up and fell backwards hitting her tailbone. Patient stated that has been in pain since then   Clinical Impression   Pt admitted from [facility] after fall.  She has a h/o lung CA and has radiation eophagitis. Pt will benefit from skilled OT to increase independence with adls with supervision level goals in acute.    OT Assessment  Patient needs continued OT Services    Follow Up Recommendations  SNF    Barriers to Discharge      Equipment Recommendations  3 in 1 bedside comode    Recommendations for Other Services    Frequency  Min 2X/week    Precautions / Restrictions Precautions Precautions: Fall Restrictions Weight Bearing Restrictions: No   Pertinent Vitals/Pain Pain in tailbone: painful especially with transitional movements.  Repositioned in bed with pillow under sacram    ADL  Grooming: Wash/dry hands;Wash/dry face;Set up Where Assessed - Grooming: Unsupported sitting Upper Body Bathing: Set up Where Assessed - Upper Body Bathing: Unsupported sitting Lower Body Bathing: Minimal assistance Where Assessed - Lower Body Bathing: Supported sit to stand Upper Body Dressing: Minimal assistance (iv) Where Assessed - Upper Body Dressing: Unsupported sitting Lower Body Dressing: Minimal assistance Where Assessed - Lower Body  Dressing: Supported sit to stand Toileting - Architect and Hygiene: Minimal assistance Where Assessed - Engineer, mining and Hygiene: Sit to stand from 3-in-1 or toilet Transfers/Ambulation Related to ADLs: bed mobility min A; sit to stand, min A from bed ADL Comments: Performed ADL from EOB.  Pt able to cross legs for LB ADLs    OT Diagnosis: Generalized weakness  OT Problem List: Decreased strength;Decreased activity tolerance;Pain;Impaired balance (sitting and/or standing);Decreased knowledge of use of DME or AE OT Treatment Interventions: Self-care/ADL training;Energy conservation;DME and/or AE instruction;Therapeutic activities;Patient/family education;Balance training   OT Goals(Current goals can be found in the care plan section) Acute Rehab OT Goals Patient Stated Goal: to get rid of back and tailbone pain OT Goal Formulation: With patient Time For Goal Achievement: 12/07/12 Potential to Achieve Goals: Good ADL Goals Pt Will Perform Grooming: with supervision;standing Pt Will Perform Lower Body Bathing: with supervision;sit to/from stand Pt Will Perform Lower Body Dressing: with supervision;sit to/from stand Pt Will Transfer to Toilet: with min guard assist;ambulating;bedside commode Pt Will Perform Toileting - Clothing Manipulation and hygiene: with supervision;sit to/from stand  Visit Information  Last OT Received On: 11/23/12 Assistance Needed: +1 History of Present Illness: Heather Bullock is a 79 y.o. female with history of lung cancer status post radiation therapy, recently diagnosed radiation esophagitis leading to decreased oral intake, history of nonischemic cardiomyopathy EF of 25-30% per 2-D echo 07/26/12, hypertension, status post ICD, chronic renal insufficiency, history of CHF presented to the ED after a fall. Patient states that she fell 2 days prior to admission when she got up and fell backwards hitting her tailbone. Patient stated that has been  in pain since  then       Prior Functioning     Home Living Family/patient expects to be discharged to:: Skilled nursing facility Living Arrangements: Alone Additional Comments: Pt wants to go to a different facility Prior Function Level of Independence: Needs assistance Communication Communication: No difficulties         Vision/Perception     Cognition  Cognition Arousal/Alertness: Awake/alert Behavior During Therapy: WFL for tasks assessed/performed Overall Cognitive Status: Within Functional Limits for tasks assessed    Extremity/Trunk Assessment Upper Extremity Assessment Upper Extremity Assessment: Overall WFL for tasks assessed     Mobility Bed Mobility Bed Mobility: Rolling Right;Sit to Supine Rolling Right: 4: Min assist;With rail Right Sidelying to Sit: 4: Min assist Sit to Supine: 4: Min assist Sit to Sidelying Right: 4: Min assist Details for Bed Mobility Assistance: pt wants to sit on a pillow in the bed to relieve pain at tailbone Transfers Sit to Stand: 4: Min assist;From bed;With upper extremity assist Stand to Sit: 4: Min assist;To chair/3-in-1 Details for Transfer Assistance: sacral area pain     Exercise     Balance Static Standing Balance Static Standing - Balance Support: No upper extremity supported Static Standing - Level of Assistance: 5: Stand by assistance Static Standing - Comment/# of Minutes: 1   End of Session OT - End of Session Activity Tolerance: Patient limited by pain Patient left: in bed;with call bell/phone within reach;with bed alarm set (called up for bed alarm)  GO     Sedric Guia 11/23/2012, 3:51 PM Marica Otter, OTR/L [phone] 11/23/2012

## 2012-11-23 NOTE — Progress Notes (Addendum)
Clinical Social Work Department CLINICAL SOCIAL WORK PLACEMENT NOTE 11/23/2012  Patient:  Heather Bullock, Heather Bullock  Account Number:  [id number] Admit date:  11/22/2012  Clinical Social Worker:  Jacelyn Grip  Date/time:  11/23/2012 03:15 PM  Clinical Social Work is seeking post-discharge placement for this patient at the following level of care:   SKILLED NURSING   (*CSW will update this form in Epic as items are completed)   11/23/2012  Patient/family provided with [facility] Department of Clinical Social Work's list of facilities offering this level of care within the geographic area requested by the patient (or if unable, by the patient's family).  11/23/2012  Patient/family informed of their freedom to choose among providers that offer the needed level of care, that participate in Medicare, Medicaid or managed care program needed by the patient, have an available bed and are willing to accept the patient.  11/23/2012  Patient/family informed of [facility]' ownership interest in [facility], as well as of the fact that they are under no obligation to receive care at this facility.  PASARR submitted to EDS on 11/23/2012 PASARR number received from EDS on 11/23/2012  FL2 transmitted to all facilities in geographic area requested by pt/family on  11/23/2012 FL2 transmitted to all facilities within larger geographic area on   Patient informed that his/her managed care company has contracts with or will negotiate with  certain facilities, including the following:     Patient/family informed of bed offers received:  11/24/2012 Patient chooses bed at [facility]  Physician recommends and patient chooses bed at    Patient to be transferred to [facility]  on  11/26/12 Patient to be transferred to facility by [facility]  The following physician request were entered in Epic:   Additional Comments:    Jacklynn Lewis, MSW, LCSWA  Clinical Social Work [phone]

## 2012-11-23 NOTE — Progress Notes (Signed)
CSW followed up with pt at bedside as CSW had received conflicting reports about if pt planned to return to [facility] or wanted to explore other options for d/c plan.  Pt discussed that she did admit from [facility], but states that she does not plan to return to [place]. Pt is unsure if she wants to go to another SNF versus returning home and hiring private caregivers. Pt agreeable to initiation of SNF search to explore other options of facilities.  CSW requested MD to place PT evaluation as pt has [employer] and insurance requires authorization prior to pt discharge to SNF if pt decides to go to another SNF.  CSW to initiate SNF search following PT evaluation.   CSW to continue to follow to assist with pt discharge planning needs.   Jacklynn Lewis, MSW, LCSWA  Clinical Social Work [phone]

## 2012-11-23 NOTE — Evaluation (Signed)
Physical Therapy Evaluation Patient Details Name: Heather Bullock MRN: [id number] DOB: [birth year] Today's Date: 11/23/2012 Time: 4098-1191 PT Time Calculation (min): 17 min  PT Assessment / Plan / Recommendation History of Present Illness  Heather Bullock is a 79 y.o. female with history of lung cancer status post radiation therapy, recently diagnosed radiation esophagitis leading to decreased oral intake, history of nonischemic cardiomyopathy EF of 25-30% per 2-D echo 07/26/12, hypertension, status post ICD, chronic renal insufficiency, history of CHF presented to the ED after a fall. Patient states that she fell 2 days prior to admission when she got up and fell backwards hitting her tailbone. Patient stated that has been in pain since then  Clinical Impression  Pt is deconditioned with decreased activity tolerance due to back and tailbone pain and also esophageal burning.  She will benefit from continued PT    PT Assessment  Patient needs continued PT services    Follow Up Recommendations  SNF    Does the patient have the potential to tolerate intense rehabilitation      Barriers to Discharge   pt reports she does not want to go back to [employer] walker with 5" wheels    Recommendations for Other Services OT consult   Frequency Min 3X/week    Precautions / Restrictions     Pertinent Vitals/Pain Back, tailbone, upper abdomen      Mobility  Bed Mobility Bed Mobility: Rolling Right;Supine to Sit Supine to Sit: 4: Min assist Sitting - Scoot to Edge of Bed: 4: Min assist Details for Bed Mobility Assistance: multimodal cues with hand over hand assis to bedrail to assist with supine to sit.  Assis to lift trunk into sitting Transfers Transfers: Sit to Stand;Stand to Sit Sit to Stand: 3: Mod assist Stand to Sit: 3: Mod assist Details for Transfer Assistance: Pt limited by pain in back, tailbone area, esopageal  burning Ambulation/Gait Ambulation/Gait Assistance: 4: Min assist Ambulation Distance (Feet): 3 Feet Ambulation/Gait Assistance Details: safety Gait Pattern: Trunk flexed;Decreased step length - right;Decreased step length - left Gait velocity: decreased General Gait Details: Pt appears very uncomfortable Stairs: No Wheelchair Mobility Wheelchair Mobility: No    Exercises General Exercises - Lower Extremity Ankle Circles/Pumps: AROM;Both;10 reps;Supine Quad Sets: AROM;Both;10 reps;Supine Gluteal Sets: AROM;Both;Standing Long Arc Quad: AROM;Both;5 reps;Seated   PT Diagnosis: Difficulty walking;Acute pain;Generalized weakness  PT Problem List: Decreased activity tolerance;Decreased mobility PT Treatment Interventions: Gait training;Functional mobility training;Therapeutic exercise;Therapeutic activities;Patient/family education     PT Goals(Current goals can be found in the care plan section) Acute Rehab PT Goals Patient Stated Goal: to get rid of back and tailbone pain PT Goal Formulation: With patient Time For Goal Achievement: 11/30/12 Potential to Achieve Goals: Good  Visit Information  Last PT Received On: 11/23/12 History of Present Illness: Heather Bullock is a 79 y.o. female with history of lung cancer status post radiation therapy, recently diagnosed radiation esophagitis leading to decreased oral intake, history of nonischemic cardiomyopathy EF of 25-30% per 2-D echo 07/26/12, hypertension, status post ICD, chronic renal insufficiency, history of CHF presented to the ED after a fall. Patient states that she fell 2 days prior to admission when she got up and fell backwards hitting her tailbone. Patient stated that has been in pain since then       Prior Functioning  Home Living Family/patient expects to be discharged to:: Skilled nursing facility Additional Comments: Pt wants to go to a different facility Prior Function Level of  Independence: Needs  assistance Communication Communication: No difficulties    Cognition  Cognition Arousal/Alertness: Awake/alert Behavior During Therapy: WFL for tasks assessed/performed Overall Cognitive Status: Within Functional Limits for tasks assessed    Extremity/Trunk Assessment Lower Extremity Assessment Lower Extremity Assessment: Generalized weakness   Balance Static Sitting Balance Static Sitting - Balance Support: Feet supported;Bilateral upper extremity supported Static Sitting - Level of Assistance: 7: Independent  End of Session PT - End of Session Activity Tolerance: Patient limited by fatigue Patient left: in bed;with call bell/phone within reach Nurse Communication: Mobility status  GP     Donnetta Hail 11/23/2012, 11:47 AM

## 2012-11-23 NOTE — Progress Notes (Signed)
INITIAL NUTRITION ASSESSMENT  Pt meets criteria for severe MALNUTRITION in the context of chronic illness as evidenced by <75% estimated energy intake in the past month with 11.7% weight loss in the past 2 months and severe muscle wasting and weakness in upper arms.  DOCUMENTATION CODES Per approved criteria  -Severe malnutrition in the context of chronic illness   INTERVENTION: - Diet advancement per MD - Multivitamin 1 tablet PO daily - Pain medications per MD - pt c/o nothing is helping her pain from radiation esophagitis  - Will continue to monitor   NUTRITION DIAGNOSIS: Inadequate oral intake related to pain with eating from radiation esophagitis as evidenced by 0% meal intake.   Goal: 1. Resolution of pain from radiation esophagitis 2. Advance diet as tolerated to bland diet  Monitor:  Weights, labs, diet advancement, pain with eating  Reason for Assessment: Consult, nutrition risk,   79 y.o. female  Admitting Dx: Radiation esophagitis  ASSESSMENT: Pt with non-small cell lung CA s/p radiation, completed 10/27/12. D/c from this hospital 11/08/12, admitted with nausea/vomiting and abdominal pain. Pt seen by inpatient RD during admission earlier this month. Noted pt had c/o food getting stuck in her stomach. Pt's weight was 158 pounds during that admission, now 151 pounds. Pt had EGD 11/05/12 and was found to have severe ulcerative esophagitis.  Met with pt who reports she has been unable to eat/drink since discharge and states even water burns. Points to her lower esophagus as source of pain and states none of the medications are helping with this. C/o weakness PTA and states she fell Saturday night PTA on her tailbone. Upon doing nutrition focus physical exam on upper arms, pt with severe weakness.   Height: Ht Readings from Last 1 Encounters:  11/22/12 5\' 3"  (1.6 m)    Weight: Wt Readings from Last 1 Encounters:  11/23/12 151 lb 12.8 oz (68.856 kg)    Ideal Body Weight:  115 lb  % Ideal Body Weight: 131%  Wt Readings from Last 10 Encounters:  11/23/12 151 lb 12.8 oz (68.856 kg)  11/02/12 158 lb (71.668 kg)  11/02/12 158 lb (71.668 kg)  11/02/12 154 lb 14.4 oz (70.262 kg)  10/25/12 156 lb 4.8 oz (70.897 kg)  10/20/12 159 lb 8 oz (72.349 kg)  10/04/12 165 lb 9.6 oz (75.116 kg)  09/30/12 169 lb 6.4 oz (76.839 kg)  09/30/12 169 lb (76.658 kg)  09/24/12 171 lb (77.565 kg)    Usual Body Weight: 160 lb per pt  % Usual Body Weight: 94%  BMI:  Body mass index is 26.9 kg/(m^2).  Estimated Nutritional Needs: Kcal: 1725-2000 Protein: 85-105g Fluid: 2L/day  Skin: Intact  Diet Order: Clear Liquid  EDUCATION NEEDS: -No education needs identified at this time   Intake/Output Summary (Last 24 hours) at 11/23/12 1425 Last data filed at 11/23/12 0948  Gross per 24 hour  Intake    820 ml  Output    800 ml  Net     20 ml    Last BM: 7/28  Labs:   Recent Labs Lab 11/22/12 1723 11/23/12 0400  NA 138 141  K 3.2* 3.9  CL 96 103  CO2 32 27  BUN 30* 26*  CREATININE 1.52* 1.31*  CALCIUM 9.4 9.2  MG 2.5  --   GLUCOSE 103* 99    CBG (last 3)  No results found for this basename: GLUCAP,  in the last 72 hours  Scheduled Meds: . acyclovir  350 mg Intravenous Q12H  .  antiseptic oral rinse  15 mL Mouth Rinse q12n4p  . antiseptic oral rinse  15 mL Mouth Rinse BID  . cefTRIAXone (ROCEPHIN)  IV  1 g Intravenous Q24H  . diclofenac  1 patch Transdermal BID  . docusate sodium  100 mg Oral Daily  . feeding supplement  1 Container Oral TID BM  . fluconazole (DIFLUCAN) IV  200 mg Intravenous Q24H  . gabapentin  300 mg Oral BID  . magic mouthwash w/lidocaine  5 mL Oral QID  . metoprolol  2.5 mg Intravenous Q8H  . nystatin  500,000 Units Oral QID  . ondansetron  4 mg Intravenous Q8H  . oxybutynin  5 mg Oral Daily  . pantoprazole (PROTONIX) IV  40 mg Intravenous Q12H  . polyethylene glycol  17 g Oral Daily  . potassium chloride  20 mEq Oral Daily   . salmeterol  1 puff Inhalation Q12H  . simvastatin  20 mg Oral q1800  . sucralfate  1 g Oral TID WC & HS  . zolpidem  5 mg Oral QHS    Continuous Infusions: . 0.9 % NaCl with KCl 40 mEq / L 75 mL/hr (11/23/12 1222)    Past Medical History  Diagnosis Date  . LBBB (left bundle branch block)   . Nonischemic cardiomyopathy     EF 25-30% echo 2010, 45% echo 2012  . Hypertension   . Diverticulitis   . ICD (implantable cardiac defibrillator) in place     CRT-D [employer]  . Chronic fatigue   . Fibromyalgia   . Shortness of breath     followed by Dr. Shelle Iron  . Elevated uric acid   . CRI (chronic renal insufficiency)   . Abdominal bloating   . Osteopenia   . CHF (congestive heart failure)   . Arthritis   . COPD (chronic obstructive pulmonary disease)   . Asthma   . Non-small cell lung cancer     Past Surgical History  Procedure Laterality Date  . Crt-d-st[employer] jude[employer]    . Replacement total knee    . Abdominal hysterectomy    . Appendectomy    . Neck surgery    . Tonsillectomy    . Neck surgery  02/2007    cervical  . Laser surgery left eye  2013  . Oral surgery/infection in root of teeth  2013  . Video bronchoscopy Bilateral 08/12/2012    Procedure: VIDEO BRONCHOSCOPY WITHOUT FLUORO;  Surgeon: Barbaraann Share, MD;  Location: [facility];  Service: Cardiopulmonary;  Laterality: Bilateral;  . Esophagogastroduodenoscopy N/A 11/05/2012    Procedure: ESOPHAGOGASTRODUODENOSCOPY (EGD);  Surgeon: Vertell Novak., MD;  Location: [facility];  Service: Endoscopy;  Laterality: N/A;    Levon Hedger MS, RD, LDN [phone] Pager [phone] After Hours Pager

## 2012-11-24 ENCOUNTER — Other Ambulatory Visit: Payer: Self-pay | Admitting: Radiation Oncology

## 2012-11-24 ENCOUNTER — Ambulatory Visit
Admit: 2012-11-24 | Discharge: 2012-11-24 | Disposition: A | Discharge status: Home / Self Care | Payer: Medicare Other | Admitting: Radiation Oncology

## 2012-11-24 LAB — CBC
Hemoglobin: 11.1 g/dL — ABNORMAL LOW (ref 12.0–15.0)
MCH: 30.3 pg (ref 26.0–34.0)
MCV: 95.6 fL (ref 78.0–100.0)
RBC: 3.66 MIL/uL — ABNORMAL LOW (ref 3.87–5.11)

## 2012-11-24 LAB — BASIC METABOLIC PANEL
CO2: 30 mEq/L (ref 19–32)
Calcium: 9.1 mg/dL (ref 8.4–10.5)
Chloride: 108 mEq/L (ref 96–112)
Glucose, Bld: 98 mg/dL (ref 70–99)
Sodium: 143 mEq/L (ref 135–145)

## 2012-11-24 NOTE — Progress Notes (Signed)
CSW followed up with pt to provide SNF bed offers.  CSW provided SNF bed offers at bedside.  Pt plans to review offers and CSW will follow up for decision.   CSW to continue to follow to assist with pt discharge planning needs.   Jacklynn Lewis, MSW, LCSWA  Clinical Social Work [phone]

## 2012-11-24 NOTE — Progress Notes (Signed)
I visited the patient today with her complaining of significant ongoing esophagitis. Completed her course of definitive radiotherapy to the long approximately one month ago. The patient did receive a hypo-fractionated course of radiation treatment to a full dose, 70.2 gray. She states that this has slightly improved recently but is persistent.  In reviewing the patient's medication list, I do not see anything to add which will provide a clear benefit. I agree with continuing her medications including protonix, Carafate, Magic mouthwash with lidocaine, and pain medication. The latter medication can be adjusted as warranted.  I discussed all of this with the patient including my expectation that this will improve over the next 2-3 weeks. I believe that she is aware that this is a potentially slow process but should continue to move in an improved direction.  I will schedule followup with her as an outpatient in several weeks after undergoing a repeat CT scan of the chest.

## 2012-11-24 NOTE — Progress Notes (Signed)
[facility] PROGRESS NOTE  Heather Bullock WGN:[id number] DOB: 1928/09/22 DOA: 11/22/2012 PCP: Thora Lance, MD  Brief narrative: Heather Bullock is an 79 y.o. female with a PMH of non-small cell lung cancer status post radiation therapy complicated by ulcerative esophagitis, systolic CHF status post defibrillator placement with an EF of 25-30% 06/2012, chronic renal insufficiency, who was admitted on 11/22/2012 status post fall in her skilled nursing facility with the chief complaint of generalized pain. Initial radiographs were negative for fracture.  Assessment/Plan: Principal problem: Fall -No fractures on admission radiographs. -Likely generalized weakness secondary to chronic underlying medical issues. -Status post physical therapy evaluation with recommendations to return to a skilled nursing facility at discharge. Active problems:  Radiation esophagitis, ulcerative esophagitis  -EGD was done on previous admission, 11/05/2012 with findings of severe ulcerative esophagitis and recommendation was for aggressive PPI therapy and Sucralfate.  -Continue fluconazole 200 mg IV daily.  -Continue acyclovir for possible herpes simplex virus esophagitis.  -Continue magic mouthwash with lidocaine and nystatin swish and swallow.  -Advance diet as tolerated.  -Continue pain management with morphine 2 mg IV every 2 hours PRN severe pain, morphine 2.5 mg PO Q 2 hours PRN moderate pain and norco for breakthrough pain.  Hypertension  -Continue metoprolol 2.5 mg IV every 8 hours and switch to PO once pt able to have better PO intake.  Lung cancer, lower lobe  -Status post completion of radiation treatment on 10/27/2012 under the care of Dr. Mitzi Hansen.  UTI (urinary tract infection) secondary to Escherichia coli  -Empiric rocephin started on admission. -Urine cultures not obtained this admission. Cultures from prior admission (11/02/2012) grew Escherichia coli that was Rocephin sensitive. Microscopy on 11/23/2012  did show many bacteria. Acute renal failure  -Baseline creatinine 0.95-1.15.  -Current elevation of creatinine likely prerenal. Responding to IV fluids. Hypokalemia  -Secondary to GI losses; potassium being repleted in IV fluids.  Anemia of chronic disease  -Secondary to history of malignancy. No current indication for transfusion. Hemoglobin stable. Thrombocytopenia  -Platelet count low but clumped on appearance.  Severe protein calorie malnutrition in the context of chronic illness -Seen by dietitian on 11/23/2012. Recommendations noted.  Code Status: DNR/DNI  Family Communication: No family at bedside.   Disposition Plan: Home when stable.   Medical Consultants:  None.  Other Consultants:  Dietitian  Anti-infectives:  Diflucan 11/22/2012--->  Acyclovir 11/23/2012--->  Rocephin 11/23/2012--->   HPI/Subjective: Heather Bullock continues to have severe burning epigastric pain with any attempts to eat or drink anything. She has not had any significant oral intake because of this. No nausea, vomiting or diarrhea. Her bowels last moved ago.  Objective: Filed Vitals:   11/23/12 1158 11/23/12 1400 11/23/12 2130 11/24/12 0528  BP:  125/60 111/54 132/52  Pulse:  69 66 63  Temp:  98.4 F (36.9 C) 98.8 F (37.1 C) 98.6 F (37 C)  TempSrc:   Oral Oral  Resp:   18 18  Height:      Weight:    70.081 kg (154 lb 8 oz)  SpO2: 93%  93% 97%    Intake/Output Summary (Last 24 hours) at 11/24/12 0758 Last data filed at 11/24/12 0534  Gross per 24 hour  Intake    940 ml  Output   1450 ml  Net   -510 ml    Exam: Gen:  NAD Cardiovascular:  RRR, No M/R/G Respiratory:  Lungs CTAB Gastrointestinal:  Abdomen soft, NT/ND, + BS Extremities:  Trace pedal edema  Data Reviewed: Basic Metabolic  Panel:  Recent Labs Lab 11/22/12 1723 11/23/12 0400 11/24/12 0359  NA 138 141 143  K 3.2* 3.9 4.1  CL 96 103 108  CO2 32 27 30  GLUCOSE 103* 99 98  BUN 30* 26* 17  CREATININE 1.52*  1.31* 1.20*  CALCIUM 9.4 9.2 9.1  MG 2.5  --   --    GFR Estimated Creatinine Clearance: 34 ml/min (by C-G formula based on Cr of 1.2).  CBC:  Recent Labs Lab 11/22/12 1723 11/23/12 0400 11/24/12 0359  WBC 7.0 7.2 4.9  NEUTROABS 4.3  --   --   HGB 12.3 11.9* 11.1*  HCT 38.0 36.7 35.0*  MCV 93.8 94.1 95.6  PLT PLATELET CLUMPS NOTED ON SMEAR, COUNT APPEARS DECREASED 80* 67*   BNP (last 3 results)  Recent Labs  12/26/11 1043 11/02/12 1627  PROBNP 63.0 325.7   Thyroid function studies  Recent Labs  11/23/12 0400  TSH 3.902   Microbiology No results found for this or any previous visit (from the past 240 hour(s)).   Procedures and Diagnostic Studies: Dg Thoracic Spine 2 View  11/22/2012   *RADIOLOGY REPORT*  Clinical Data: Fall with back pain.  THORACIC SPINE - 2 VIEW  Comparison: Chest CT on 06/28/2012  Findings: Mild degenerative changes are seen throughout the thoracic spine.  No acute fracture or subluxation is identified. No bony lesions are seen.  IMPRESSION: No evidence of thoracic fracture.   Original Report Authenticated By: Irish Lack, M.D.   Dg Lumbar Spine Complete  11/22/2012   *RADIOLOGY REPORT*  Clinical Data: Fall with low back pain.  LUMBAR SPINE - COMPLETE 4+ VIEW  Comparison: 02/02/2004  Findings: There is stable minimal compression of the superior endplate of L2.  There is some progression of degenerative disc disease at L2-3, L3-4 and L4-5.  No acute fracture or subluxation is identified.  No bony lesions are seen.  IMPRESSION: No acute fracture.  Stable mild compression of L2.  Progressive degenerative disc disease of the lumbar spine.   Original Report Authenticated By: Irish Lack, M.D.   Dg Sacrum/coccyx  11/22/2012   *RADIOLOGY REPORT*  Clinical Data: Fall with lower back and coccyx pain.  SACRUM AND COCCYX - 2+ VIEW  Comparison: None.  Findings: No evidence of sacral fracture.  The coccyx appears normally aligned.  Degenerative changes are  visualized in the lower lumbar spine.  The sacroiliac joints are symmetric bilaterally and show no abnormal widening.  IMPRESSION: No evidence of sacral or coccygeal fracture.   Original Report Authenticated By: Irish Lack, M.D.   Dg Abd Acute W/chest  11/22/2012   *RADIOLOGY REPORT*  Clinical Data: Emesis.  Nausea.  Dehydration and weakness.  ACUTE ABDOMEN SERIES (ABDOMEN 2 VIEW & CHEST 1 VIEW)  Comparison: Acute abdominal series 11/02/2012.  Findings: Chronic changes of the chest are present with left subclavian three lead with CAD.  The lead appears similar. Scattered areas of pulmonary parenchymal scarring.  No free air underneath the hemidiaphragms.  Chronic blunting of the left costophrenic angle with left lower lobe collapse / consolidation which appears similar to the prior CT of 06/28/2012. Aortic arch atherosclerosis.  There is no free air underneath the hemidiaphragms.  Decubitus view is performed which also demonstrates no free air. Bowel gas pattern is nonobstructive with bowel gas extend to the level of the rectosigmoid.  Comparing to the most recent prior abdominal radiographs series, there is no interval change.  IMPRESSION: 1.  No acute abnormality. 2.  Left lower  lobe collapse / consolidation appears chronic, similar to prior examinations. 3.  Nonobstructive bowel gas pattern.   Original Report Authenticated By: Andreas Newport, M.D.   Scheduled Meds: . acyclovir  350 mg Intravenous Q12H  . antiseptic oral rinse  15 mL Mouth Rinse q12n4p  . antiseptic oral rinse  15 mL Mouth Rinse BID  . cefTRIAXone (ROCEPHIN)  IV  1 g Intravenous Q24H  . diclofenac  1 patch Transdermal BID  . docusate sodium  100 mg Oral Daily  . feeding supplement  1 Container Oral TID BM  . fluconazole (DIFLUCAN) IV  200 mg Intravenous Q24H  . gabapentin  300 mg Oral BID  . magic mouthwash w/lidocaine  5 mL Oral QID  . metoprolol  2.5 mg Intravenous Q8H  . multivitamin with minerals  1 tablet Oral Daily  .  nystatin  500,000 Units Oral QID  . ondansetron  4 mg Intravenous Q8H  . oxybutynin  5 mg Oral Daily  . pantoprazole (PROTONIX) IV  40 mg Intravenous Q12H  . polyethylene glycol  17 g Oral Daily  . potassium chloride  20 mEq Oral Daily  . salmeterol  1 puff Inhalation Q12H  . simvastatin  20 mg Oral q1800  . sucralfate  1 g Oral TID WC & HS  . zolpidem  5 mg Oral QHS   Continuous Infusions: . 0.9 % NaCl with KCl 40 mEq / L 75 mL/hr (11/23/12 1222)    Time spent: 35 minutes with greater than 50% of the time spent discussing the patient's current symptoms, diagnostic impression, and plan of care.   LOS: 2 days   Heather Bullock  [facility] Pager [phone].   *Please note that the hospitalists switch teams on Wednesdays. Please call the flow manager at [phone] if you are having difficulty reaching the hospitalist taking care of this patient as she can update you and provide the most up-to-date pager number of provider caring for the patient. If 8PM-8AM, please contact night-coverage at [email or web address], password [facility]  11/24/2012, 7:58 AM

## 2012-11-25 ENCOUNTER — Telehealth: Payer: Self-pay | Admitting: *Deleted

## 2012-11-25 LAB — URINE CULTURE: Colony Count: >=100000

## 2012-11-25 MED ORDER — MAGIC MOUTHWASH W/LIDOCAINE
5.0000 mL | Freq: Three times a day (TID) | ORAL | Status: DC
  Administered 2012-11-25 – 2012-11-26 (×4): 5 mL via ORAL
  Filled 2012-11-25 (×7): qty 5

## 2012-11-25 MED ORDER — NYSTATIN 100000 UNIT/ML MT SUSP
5.0000 mL | Freq: Three times a day (TID) | OROMUCOSAL | Status: DC
  Administered 2012-11-25 – 2012-11-26 (×4): 500000 [IU] via ORAL
  Filled 2012-11-25 (×7): qty 5

## 2012-11-25 NOTE — Progress Notes (Signed)
CSW received phone call from pt sister-in-law, Jasmine December stating that pt family had toured SNF and choice for SNF is [facility].  CSW confirmed with [facility] that facility could accept pt tomorrow if pt medically ready for discharge tomorrow and have private room available for pt upon admission.   CSW notified pt insurance company and will follow up with pt insurance tomorrow to obtain insurance authorization for SNF.  CSW to continue to follow to facilitate pt discharge needs when pt medically ready for discharge.   Jacklynn Lewis, MSW, LCSWA  Clinical Social Work [phone]

## 2012-11-25 NOTE — Progress Notes (Signed)
[facility] PROGRESS NOTE  [name] ZOX:[id number] DOB: 1929/04/21 DOA: 11/22/2012 PCP: Thora Lance, MD  Brief narrative: Heather Bullock is an 79 y.o. female with a PMH of non-small cell lung cancer status post radiation therapy complicated by ulcerative esophagitis, systolic CHF status post defibrillator placement with an EF of 25-30% 06/2012, chronic renal insufficiency, who was admitted on 11/22/2012 status post fall in her skilled nursing facility with the chief complaint of generalized pain. Initial radiographs were negative for fracture.  Assessment/Plan: Principal problem: Fall with sacral pain -No fractures on admission radiographs. -Likely generalized weakness secondary to chronic underlying medical issues. -Status post physical therapy evaluation with recommendations to return to a skilled nursing facility at discharge. -Donut pillow for comfort requested. Active problems:  Radiation esophagitis, ulcerative esophagitis  -EGD was done on previous admission, 11/05/2012 with findings of severe ulcerative esophagitis and recommendation was for aggressive PPI therapy and Sucralfate.  -Continue fluconazole 200 mg IV daily.  -Continue acyclovir for possible herpes simplex virus esophagitis.  -Continue magic mouthwash with lidocaine and nystatin swish and swallow.  -Advance diet as tolerated.  -Continue pain management with morphine 2 mg IV every 2 hours PRN severe pain, morphine 2.5 mg PO Q 2 hours PRN moderate pain and norco for breakthrough pain.  -Seen by Dr. Mitzi Hansen who agrees with the above plan. Hypertension  -Continue metoprolol 2.5 mg IV every 8 hours and switch to PO once pt able to have better PO intake.  Lung cancer, lower lobe  -Status post completion of radiation treatment on 10/27/2012 under the care of Dr. Mitzi Hansen.  UTI (urinary tract infection) secondary to Escherichia coli  -Empiric rocephin started on admission. -Urine cultures not obtained this admission. Cultures  from prior admission (11/02/2012) grew Escherichia coli that was Rocephin sensitive. Microscopy on 11/23/2012 did show many bacteria. Acute renal failure  -Baseline creatinine 0.95-1.15.  -Current elevation of creatinine likely prerenal. Responding to IV fluids. Hypokalemia  -Secondary to GI losses; potassium being repleted in IV fluids.  Anemia of chronic disease  -Secondary to history of malignancy. No current indication for transfusion. Hemoglobin stable. Thrombocytopenia  -Platelet count low but clumped on appearance.  Severe protein calorie malnutrition in the context of chronic illness -Seen by dietitian on 11/23/2012. Recommendations noted.  Code Status: DNR/DNI  Family Communication: No family at bedside.   Disposition Plan: Home when stable.   Medical Consultants:  Dr. Jonna Coup, Radiation Oncology.  Other Consultants:  Dietitian  Anti-infectives:  Diflucan 11/22/2012--->  Acyclovir 11/23/2012--->  Rocephin 11/23/2012--->   HPI/Subjective: Heather Bullock continues to have severe burning epigastric pain with any attempts to eat or drink anything. She also complains of pain on her tailbone. No nausea, vomiting or diarrhea.   Objective: Filed Vitals:   11/24/12 2009 11/24/12 2129 11/25/12 0456 11/25/12 0500  BP:  139/68 134/70   Pulse:  67 70   Temp:  98.4 F (36.9 C) 97.3 F (36.3 C)   TempSrc:  Oral Oral   Resp:  16 18   Height:      Weight:    70.262 kg (154 lb 14.4 oz)  SpO2: 95% 97% 99%     Intake/Output Summary (Last 24 hours) at 11/25/12 8119 Last data filed at 11/25/12 0457  Gross per 24 hour  Intake   1030 ml  Output   1750 ml  Net   -720 ml    Exam: Gen:  NAD Cardiovascular:  RRR, No M/R/G Respiratory:  Lungs CTAB Gastrointestinal:  Abdomen soft, NT/ND, +  BS Extremities:  Trace pedal edema  Data Reviewed: Basic Metabolic Panel:  Recent Labs Lab 11/22/12 1723 11/23/12 0400 11/24/12 0359  NA 138 141 143  K 3.2* 3.9 4.1  CL 96 103  108  CO2 32 27 30  GLUCOSE 103* 99 98  BUN 30* 26* 17  CREATININE 1.52* 1.31* 1.20*  CALCIUM 9.4 9.2 9.1  MG 2.5  --   --    GFR Estimated Creatinine Clearance: 34 ml/min (by C-G formula based on Cr of 1.2).  CBC:  Recent Labs Lab 11/22/12 1723 11/23/12 0400 11/24/12 0359  WBC 7.0 7.2 4.9  NEUTROABS 4.3  --   --   HGB 12.3 11.9* 11.1*  HCT 38.0 36.7 35.0*  MCV 93.8 94.1 95.6  PLT PLATELET CLUMPS NOTED ON SMEAR, COUNT APPEARS DECREASED 80* 67*   BNP (last 3 results)  Recent Labs  12/26/11 1043 11/02/12 1627  PROBNP 63.0 325.7   Thyroid function studies  Recent Labs  11/23/12 0400  TSH 3.902   Microbiology Recent Results (from the past 240 hour(s))  URINE CULTURE     Status: None   Collection Time    11/23/12  3:55 AM      Result Value Range Status   Specimen Description URINE, CLEAN CATCH   Final   Special Requests NONE   Final   Culture  Setup Time 11/23/2012 09:15   Final   Colony Count >=100,000 COLONIES/ML   Final   Culture ESCHERICHIA COLI   Final   Report Status 11/25/2012 FINAL   Final   Organism ID, Bacteria ESCHERICHIA COLI   Final     Procedures and Diagnostic Studies: Dg Thoracic Spine 2 View  11/22/2012   *RADIOLOGY REPORT*  Clinical Data: Fall with back pain.  THORACIC SPINE - 2 VIEW  Comparison: Chest CT on 06/28/2012  Findings: Mild degenerative changes are seen throughout the thoracic spine.  No acute fracture or subluxation is identified. No bony lesions are seen.  IMPRESSION: No evidence of thoracic fracture.   Original Report Authenticated By: Irish Lack, M.D.   Dg Lumbar Spine Complete  11/22/2012   *RADIOLOGY REPORT*  Clinical Data: Fall with low back pain.  LUMBAR SPINE - COMPLETE 4+ VIEW  Comparison: 02/02/2004  Findings: There is stable minimal compression of the superior endplate of L2.  There is some progression of degenerative disc disease at L2-3, L3-4 and L4-5.  No acute fracture or subluxation is identified.  No bony  lesions are seen.  IMPRESSION: No acute fracture.  Stable mild compression of L2.  Progressive degenerative disc disease of the lumbar spine.   Original Report Authenticated By: Irish Lack, M.D.   Dg Sacrum/coccyx  11/22/2012   *RADIOLOGY REPORT*  Clinical Data: Fall with lower back and coccyx pain.  SACRUM AND COCCYX - 2+ VIEW  Comparison: None.  Findings: No evidence of sacral fracture.  The coccyx appears normally aligned.  Degenerative changes are visualized in the lower lumbar spine.  The sacroiliac joints are symmetric bilaterally and show no abnormal widening.  IMPRESSION: No evidence of sacral or coccygeal fracture.   Original Report Authenticated By: Irish Lack, M.D.   Dg Abd Acute W/chest  11/22/2012   *RADIOLOGY REPORT*  Clinical Data: Emesis.  Nausea.  Dehydration and weakness.  ACUTE ABDOMEN SERIES (ABDOMEN 2 VIEW & CHEST 1 VIEW)  Comparison: Acute abdominal series 11/02/2012.  Findings: Chronic changes of the chest are present with left subclavian three lead with CAD.  The lead appears similar. Scattered  areas of pulmonary parenchymal scarring.  No free air underneath the hemidiaphragms.  Chronic blunting of the left costophrenic angle with left lower lobe collapse / consolidation which appears similar to the prior CT of 06/28/2012. Aortic arch atherosclerosis.  There is no free air underneath the hemidiaphragms.  Decubitus view is performed which also demonstrates no free air. Bowel gas pattern is nonobstructive with bowel gas extend to the level of the rectosigmoid.  Comparing to the most recent prior abdominal radiographs series, there is no interval change.  IMPRESSION: 1.  No acute abnormality. 2.  Left lower lobe collapse / consolidation appears chronic, similar to prior examinations. 3.  Nonobstructive bowel gas pattern.   Original Report Authenticated By: Andreas Newport, M.D.   Scheduled Meds: . acyclovir  350 mg Intravenous Q12H  . antiseptic oral rinse  15 mL Mouth Rinse  q12n4p  . antiseptic oral rinse  15 mL Mouth Rinse BID  . cefTRIAXone (ROCEPHIN)  IV  1 g Intravenous Q24H  . diclofenac  1 patch Transdermal BID  . docusate sodium  100 mg Oral Daily  . feeding supplement  1 Container Oral TID BM  . fluconazole (DIFLUCAN) IV  200 mg Intravenous Q24H  . gabapentin  300 mg Oral BID  . magic mouthwash w/lidocaine  5 mL Oral QID  . metoprolol  2.5 mg Intravenous Q8H  . multivitamin with minerals  1 tablet Oral Daily  . nystatin  500,000 Units Oral QID  . oxybutynin  5 mg Oral Daily  . pantoprazole (PROTONIX) IV  40 mg Intravenous Q12H  . polyethylene glycol  17 g Oral Daily  . potassium chloride  20 mEq Oral Daily  . salmeterol  1 puff Inhalation Q12H  . simvastatin  20 mg Oral q1800  . sucralfate  1 g Oral TID WC & HS  . zolpidem  5 mg Oral QHS   Continuous Infusions: . 0.9 % NaCl with KCl 40 mEq / L 75 mL/hr (11/23/12 1222)    Time spent: 25 minutes.   LOS: 3 days   Shlomo Seres  [facility] Pager [phone].   *Please note that the hospitalists switch teams on Wednesdays. Please call the flow manager at [phone] if you are having difficulty reaching the hospitalist taking care of this patient as she can update you and provide the most up-to-date pager number of provider caring for the patient. If 8PM-8AM, please contact night-coverage at [email or web address], password [facility]  11/25/2012, 7:28 AM

## 2012-11-25 NOTE — Progress Notes (Signed)
PT Cancellation Note  Patient Details Name: Heather Bullock MRN: [id number] DOB: January 07, 1929   Cancelled Treatment:    Reason Eval/Treat Not Completed: Fatigue/lethargy limiting ability to participate   Donnetta Hail 11/25/2012, 2:23 PM

## 2012-11-25 NOTE — Telephone Encounter (Signed)
CALLED PATIENT TO INFORM OF CT SCAN FOR 12-15-12 - ARRIVAL TIME 11:15 AM AND HER FU VISIT WITH DR. MOODY ON 12-16-12 AT 11:15 AM, LVM FOR A RETURN CALL

## 2012-11-25 NOTE — Progress Notes (Signed)
CSW met with pt, pt sister, pt brother, and pt son at bedside to discuss SNF placement.  CSW clarified pt family questions and addressed pt family concerns.  Pt family plans to tour facilities this afternoon to make a decision re: SNF placement.  Pt family is concerned about SNF placement as pt was recently at SNF and pt family did not feel pt medical needs were managed appropriately at previous SNF.   Pt family plans to notify CSW once decision is made as pt is likely going to be ready for discharge tomorrow.   Pt insurance aware of plan for SNF and CSW will acquire insurance authorization once pt chooses facility.  CSW to continue to follow and facilitate pt discharge needs when pt medically ready for discharge and insurance authorization received.  Jacklynn Lewis, MSW, LCSWA  Clinical Social Work [phone]

## 2012-11-25 NOTE — Progress Notes (Signed)
Occupational Therapy Treatment Patient Details Name: Heather Bullock MRN: [id number] DOB: [birth year] Today's Date: 11/25/2012 Time: 4098-1191 OT Time Calculation (min): 23 min  OT Assessment / Plan / Recommendation  History of present illness Heather Bullock is a 79 y.o. female with history of lung cancer status post radiation therapy, recently diagnosed radiation esophagitis leading to decreased oral intake, history of nonischemic cardiomyopathy EF of 25-30% per 2-D echo 07/26/12, hypertension, status post ICD, chronic renal insufficiency, history of CHF presented to the ED after a fall. Patient states that she fell 2 days prior to admission when she got up and fell backwards hitting her tailbone. Patient stated that has been in pain since then   OT comments    Follow Up Recommendations  SNF    Barriers to Discharge       Equipment Recommendations  3 in 1 bedside comode    Recommendations for Other Services    Frequency Min 2X/week   Progress towards OT Goals Progress towards OT goals: Progressing toward goals  Plan      Precautions / Restrictions Precautions Precautions: Fall Restrictions Weight Bearing Restrictions: No   Pertinent Vitals/Pain Sacral pain 8/10.  Premedicated and repositioned with pillow under hips, in bed    ADL  Grooming: Teeth care;Supervision/safety Where Assessed - Grooming: Supported standing Toilet Transfer: Hydrographic surveyor Method: Sit to Barista: Materials engineer and Hygiene: Supervision/safety Where Assessed - Engineer, mining and Hygiene: Sit to stand from 3-in-1 or toilet Transfers/Ambulation Related to ADLs: ambulated to sink then to end of bed to use 3:1 commode ADL Comments: Pt had pain in sacral area; fatiqued from poor night of sleep;    OT Diagnosis:    OT Problem List:   OT Treatment Interventions:     OT Goals(current goals can now be found in the care plan  section) Acute Rehab OT Goals Time For Goal Achievement: 12/07/12 Potential to Achieve Goals: Good  Visit Information  Last OT Received On: 11/25/12 Assistance Needed: +1 History of Present Illness: Heather Bullock is a 79 y.o. female with history of lung cancer status post radiation therapy, recently diagnosed radiation esophagitis leading to decreased oral intake, history of nonischemic cardiomyopathy EF of 25-30% per 2-D echo 07/26/12, hypertension, status post ICD, chronic renal insufficiency, history of CHF presented to the ED after a fall. Patient states that she fell 2 days prior to admission when she got up and fell backwards hitting her tailbone. Patient stated that has been in pain since then    Subjective Data      Prior Functioning       Cognition  Cognition Arousal/Alertness: Awake/alert Behavior During Therapy: WFL for tasks assessed/performed Overall Cognitive Status: Within Functional Limits for tasks assessed    Mobility  Bed Mobility Supine to Sit: 5: Supervision;With rails;HOB elevated Transfers Sit to Stand: 4: Min guard;From bed;With upper extremity assist Details for Transfer Assistance: sacral area pain    Exercises      Balance     End of Session OT - End of Session Activity Tolerance: Patient limited by pain;Patient limited by fatigue Patient left: in bed;with call bell/phone within reach;with bed alarm set  GO     Mykel Mohl 11/25/2012, 11:28 AM Marica Otter, OTR/L [phone] 11/25/2012

## 2012-11-26 MED ORDER — ZOLPIDEM TARTRATE 5 MG PO TABS: 5.0000 mg | ORAL_TABLET | Freq: Every day | ORAL | Status: DC

## 2012-11-26 MED ORDER — HYDROCODONE-ACETAMINOPHEN 7.5-325 MG/15ML PO SOLN: 15.0000 mL | ORAL | Status: DC | PRN

## 2012-11-26 MED ORDER — NYSTATIN 100000 UNIT/ML MT SUSP: 5.0000 mL | Freq: Three times a day (TID) | OROMUCOSAL | Status: DC

## 2012-11-26 MED ORDER — DSS 100 MG PO CAPS: 100.0000 mg | ORAL_CAPSULE | Freq: Every day | ORAL | Status: DC

## 2012-11-26 MED ORDER — MAGIC MOUTHWASH W/LIDOCAINE: 5.0000 mL | Freq: Three times a day (TID) | ORAL | Status: DC

## 2012-11-26 MED ORDER — BIOTENE DRY MOUTH MT LIQD: 15.0000 mL | Freq: Two times a day (BID) | OROMUCOSAL | Status: DC

## 2012-11-26 MED ORDER — DIAZEPAM 5 MG PO TABS: 5.0000 mg | ORAL_TABLET | Freq: Four times a day (QID) | ORAL | Status: DC | PRN

## 2012-11-26 NOTE — Progress Notes (Signed)
Report called to receiving nurse, Marisue Ivan at [facility]. Reviewed history, hospital course, allergies, plan of care. Patient still taking small amounts clear liquids at mealtime. Reports some improvement over past 24 hours. Anticipates going home after short stay at rehab. This RN encouraged her to expect improvement in her gastric tract over the next three weeks. Vitals stable, discharged by ambulance, patient in reasonably good spirits, family to meet her at the facility

## 2012-11-26 NOTE — Discharge Summary (Signed)
Physician Discharge Summary  Heather Bullock OZH:[id number] DOB: 01-Sep-1928 DOA: 11/22/2012  PCP: Thora Lance, MD  Admit date: 11/22/2012 Discharge date: 11/26/2012  Recommendations for Outpatient Follow-up:  1. Being discharged to [facility]. 2. Note: Lasix and losartan discontinued. Can consider resumption once swallowing improved and if blood pressure stable and creatinine back to baseline values. 3. Continue oral nutritional supplements given poor oral intake secondary to pain with swallowing. 4. Provide donut pillow to relieve pressure on sacrum. 5. Encourage fluid intake to prevent dehydration. 6. Recommend followup renal function panel in one week to ensure stability of creatinine.  Discharge Diagnoses:  Principal Problem:    Radiation esophagitis Active Problems:    SYSTOLIC HEART FAILURE, CHRONIC    Hypertension    Lung cancer, lower lobe    Nausea & vomiting    UTI (urinary tract infection)    Hypokalemia    Anemia of chronic disease    Dehydration    Fall    Acute on chronic kidney failure    Thrombocytopenia, unspecified    Protein-calorie malnutrition, severe   Discharge Condition: Stable.  Diet recommendation: Low-sodium, heart healthy with nutritional supplementation given poor oral intake secondary to pain with swallowing.  History of present illness:  Heather Bullock is an 79 y.o. female with a PMH of non-small cell lung cancer status post radiation therapy complicated by ulcerative esophagitis, systolic CHF status post defibrillator placement with an EF of 25-30% 06/2012, chronic renal insufficiency, who was admitted on 11/22/2012 status post fall in her skilled nursing facility with the chief complaint of generalized pain. Initial radiographs were negative for fracture.   Hospital Course by problem:  Principal problem:  Fall with sacral pain  -No fractures on admission radiographs.  -Likely generalized weakness secondary to chronic underlying  medical issues.  -Status post physical therapy evaluation with recommendations to return to a skilled nursing facility at discharge.  -Donut pillow for comfort requested.  -Will discharge on liquid hydrocodone for pain management. Active problems:  Radiation esophagitis, ulcerative esophagitis  -EGD was done on previous admission, 11/05/2012 with findings of severe ulcerative esophagitis and recommendation was for aggressive PPI therapy and Sucralfate.  -Treated with fluconazole 200 mg IV daily. No need for further antifungal therapy at discharge. -Treated with empiric acyclovir for possible herpes simplex virus esophagitis. No need to continue this at discharge. -Continue magic mouthwash with lidocaine and nystatin swish and swallow.  -Seen by Dr. Mitzi Hansen who agrees with the above plan.  Hypertension  -Resume Coreg. Treated with IV metoprolol while in the hospital secondary to poor ability to swallow pills. -Continue to hold Lasix and losartan until blood pressure more stable and by mouth intake improves and creatinine returns to normal values.  Lung cancer, lower lobe  -Status post completion of radiation treatment on 10/27/2012 under the care of Dr. Mitzi Hansen.  UTI (urinary tract infection) secondary to Escherichia coli  -Empiric rocephin started on admission.  -Urine cultures not obtained this admission. Cultures from prior admission (11/02/2012) grew Escherichia coli that was Rocephin sensitive. Microscopy on 11/23/2012 did show many bacteria. No need for further antibiotic therapy at discharge. Acute renal failure  -Baseline creatinine 0.95-1.15.  -Current elevation of creatinine likely prerenal. Creatinine improved with IV fluids but not quite back to normal values as of yet.  Hypokalemia  -Secondary to GI losses; potassium being repleted in IV fluids.  Anemia of chronic disease  -Secondary to history of malignancy. No current indication for transfusion. Hemoglobin stable.  Thrombocytopenia   -Platelet  count low but clumped on appearance.  Severe protein calorie malnutrition in the context of chronic illness  -Seen by dietitian on 11/23/2012. Recommendations noted.   Procedures:  None.  Consultations:  Dr. Jonna Coup, radiation oncology.  Discharge Exam: Filed Vitals:   11/26/12 0524  BP: 127/63  Pulse: 72  Temp: 98.7 F (37.1 C)  Resp: 16   Filed Vitals:   11/25/12 1400 11/25/12 2152 11/26/12 0524 11/26/12 0809  BP: 109/64 124/56 127/63   Pulse: 70 65 72   Temp: 98.5 F (36.9 C) 98.9 F (37.2 C) 98.7 F (37.1 C)   TempSrc: Oral Oral Oral   Resp: 16 16 16    Height:      Weight:   69.7 kg (153 lb 10.6 oz)   SpO2: 95% 96% 96% 97%    Gen:  NAD Cardiovascular:  RRR, No M/R/G Respiratory: Lungs CTAB Gastrointestinal: Abdomen soft, NT/ND with normal active bowel sounds. Extremities: No C/E/C   Discharge Instructions      Discharge Orders   Future Appointments Provider Department Dept Phone   12/15/2012 11:30 AM [facility] IMAG[facility] [phone]   Patient to arrive 15 minutes prior to appointment time.   12/16/2012 11:15 AM Jonna Coup, MD [facility] RADIATION ONCOLOGY [phone]   12/20/2012 11:30 AM Barbaraann Share, MD [facility] [phone]   01/04/2013 11:10 AM [facility] [phone]   Patient should wear two piece clothing and wear no powder or deodorant. Patient should arrive 15 minutes early.   01/04/2013 4:00 PM Duke Salvia, MD [facility] Main Office [place]) [phone]   01/20/2013 10:45 AM Rollene Rotunda, MD [facility] Main Office [place]) [phone]   Future Orders Complete By Expires     Call MD for:  persistant nausea and vomiting  As directed     Call MD for:  severe uncontrolled pain  As directed     Call MD for:  temperature >100.4  As directed     Diet - low sodium heart healthy  As directed     Discharge  instructions  As directed     Comments:      You were cared for by Dr. Hillery Aldo  (a hospitalist) during your hospital stay. If you have any questions about your discharge medications or the care you received while you were in the hospital after you are discharged, you can call the unit and ask to speak with the hospitalist on call if the hospitalist that took care of you is not available. Once you are discharged, your primary care physician will handle any further medical issues. Please note that NO REFILLS for any discharge medications will be authorized once you are discharged, as it is imperative that you return to your primary care physician (or establish a relationship with a primary care physician if you do not have one) for your aftercare needs so that they can reassess your need for medications and monitor your lab values.  Any outstanding tests can be reviewed by your PCP at your follow up visit.  It is also important to review any medicine changes with your PCP.  Please bring these d/c instructions with you to your next visit so your physician can review these changes with you.  If you do not have a primary care physician, you can call [phone] for a physician referral.  It is highly recommended that you obtain a PCP for hospital  follow up.    Increase activity slowly  As directed         Medication List    STOP taking these medications       fluconazole 10 MG/ML suspension  Commonly known as:  DIFLUCAN     furosemide 80 MG tablet  Commonly known as:  LASIX     HYDROcodone-acetaminophen 10-325 MG per tablet  Commonly known as:  NORCO  Replaced by:  HYDROcodone-acetaminophen 7.5-325 mg/15 ml solution     losartan 25 MG tablet  Commonly known as:  COZAAR     morphine 20 MG/5ML solution     Potassium Chloride ER 20 MEQ Tbcr      TAKE these medications       albuterol (5 MG/ML) 0.5% nebulizer solution  Commonly known as:  PROVENTIL  Take 0.5 mLs (2.5 mg total) by  nebulization every 4 (four) hours as needed for wheezing or shortness of breath.     antiseptic oral rinse Liqd  15 mLs by Mouth Rinse route 2 (two) times daily.     BIONECT 0.2 % Gel  Generic drug:  Hyaluronate Sodium  Apply 1 application topically 2 (two) times daily. Apply to torso for burning.     bisacodyl 10 MG suppository  Commonly known as:  DULCOLAX  Place 10 mg rectally as needed for constipation.     calcium-vitamin D 500-200 MG-UNIT per tablet  Commonly known as:  OSCAL WITH D  Take 1 tablet by mouth every morning.     carvedilol 3.125 MG tablet  Commonly known as:  COREG  Take 1 tablet (3.125 mg total) by mouth 2 (two) times daily with a meal.     diazepam 5 MG tablet  Commonly known as:  VALIUM  Take 1 tablet (5 mg total) by mouth every 6 (six) hours as needed for anxiety.     DSS 100 MG Caps  Take 100 mg by mouth daily.     docusate sodium 100 MG capsule  Commonly known as:  COLACE  Take 100 mg by mouth every morning.     feeding supplement Liqd  Take 1 Container by mouth 5 (five) times daily.     formoterol 12 MCG capsule for inhaler  Commonly known as:  FORADIL AEROLIZER  Place 1 capsule (12 mcg total) into inhaler and inhale 2 (two) times daily.     gabapentin 300 MG capsule  Commonly known as:  NEURONTIN  Take 300 mg by mouth 2 (two) times daily.     HYDROcodone-acetaminophen 7.5-325 mg/15 ml solution  Commonly known as:  HYCET  Take 15 mLs by mouth every 4 (four) hours as needed for pain.     lidocaine 5 %  Commonly known as:  LIDODERM  Place 1 patch onto the skin every morning. Remove & Discard patch within 12 hours or as directed by MD     magic mouthwash w/lidocaine Soln  Take 5 mLs by mouth 4 (four) times daily -  before meals and at bedtime.     magic mouthwash w/lidocaine Soln  Take 10 mLs by mouth 4 (four) times daily -  before meals and at bedtime.     magnesium hydroxide 400 MG/5ML suspension  Commonly known as:  MILK OF MAGNESIA   Take 5 mLs by mouth daily as needed for constipation.     multivitamin Liqd  Take 5 mLs by mouth daily.     nystatin 100000 UNIT/ML suspension  Commonly known as:  MYCOSTATIN  Take 5 mLs (500,000 Units total) by mouth 4 (four) times daily -  before meals and at bedtime.     ondansetron 8 MG tablet  Commonly known as:  ZOFRAN  Take 1 tablet (8 mg total) by mouth every 8 (eight) hours as needed for nausea.     OSTEO BI-FLEX REGULAR STRENGTH PO  Take 2 tablets by mouth daily.     oxybutynin 5 MG tablet  Commonly known as:  DITROPAN  Take 5 mg by mouth daily.     pantoprazole 40 MG tablet  Commonly known as:  PROTONIX  Take 1 tablet (40 mg total) by mouth 2 (two) times daily.     polyethylene glycol powder powder  Commonly known as:  MIRALAX  Take 17 g by mouth daily.     pravastatin 40 MG tablet  Commonly known as:  PRAVACHOL  Take 40 mg by mouth every evening.     senna 8.6 MG Tabs  Commonly known as:  SENOKOT  Take 1 tablet by mouth daily as needed (Constipation).     sucralfate 1 GM/10ML suspension  Commonly known as:  CARAFATE  Take 10 mLs (1 g total) by mouth 4 (four) times daily -  with meals and at bedtime.     triamcinolone 0.025 % cream  Commonly known as:  KENALOG  Apply 1 application topically 3 (three) times daily as needed (itching). Apply to back     vitamin C 500 MG tablet  Commonly known as:  ASCORBIC ACID  Take 1,000 mg by mouth daily.     Vitamin D (Ergocalciferol) 50000 UNITS Caps  Commonly known as:  DRISDOL  Take 50,000 Units by mouth every 7 (seven) days.     zolpidem 5 MG tablet  Commonly known as:  AMBIEN  Take 1 tablet (5 mg total) by mouth at bedtime.          The results of significant diagnostics from this hospitalization (including imaging, microbiology, ancillary and laboratory) are listed below for reference.    Significant Diagnostic Studies: Dg Thoracic Spine 2 View  11/22/2012   *RADIOLOGY REPORT*  Clinical Data: Fall  with back pain.  THORACIC SPINE - 2 VIEW  Comparison: Chest CT on 06/28/2012  Findings: Mild degenerative changes are seen throughout the thoracic spine.  No acute fracture or subluxation is identified. No bony lesions are seen.  IMPRESSION: No evidence of thoracic fracture.   Original Report Authenticated By: Irish Lack, M.D.   Dg Lumbar Spine Complete  11/22/2012   *RADIOLOGY REPORT*  Clinical Data: Fall with low back pain.  LUMBAR SPINE - COMPLETE 4+ VIEW  Comparison: 02/02/2004  Findings: There is stable minimal compression of the superior endplate of L2.  There is some progression of degenerative disc disease at L2-3, L3-4 and L4-5.  No acute fracture or subluxation is identified.  No bony lesions are seen.  IMPRESSION: No acute fracture.  Stable mild compression of L2.  Progressive degenerative disc disease of the lumbar spine.   Original Report Authenticated By: Irish Lack, M.D.   Dg Sacrum/coccyx  11/22/2012   *RADIOLOGY REPORT*  Clinical Data: Fall with lower back and coccyx pain.  SACRUM AND COCCYX - 2+ VIEW  Comparison: None.  Findings: No evidence of sacral fracture.  The coccyx appears normally aligned.  Degenerative changes are visualized in the lower lumbar spine.  The sacroiliac joints are symmetric bilaterally and show no abnormal widening.  IMPRESSION: No evidence of sacral or coccygeal fracture.   Original  Report Authenticated By: Irish Lack, M.D.   Ct Abdomen Pelvis W Contrast  11/02/2012   *RADIOLOGY REPORT*  Clinical Data: Epigastric pain, nausea, vomiting.  CT ABDOMEN AND PELVIS WITH CONTRAST  Technique:  Multidetector CT imaging of the abdomen and pelvis was performed following the standard protocol during bolus administration of intravenous contrast.  Contrast: OMNIPAQUE IOHEXOL 300 MG/ML  SOLN,  Comparison: CT 08/22/2003  Findings: Left lower lobe airspace disease and small left pleural effusion partially imaged.  Minimal atelectasis medially and posteriorly in the  right lung base.  The heart is mildly enlarged. Pacer wires noted.  Liver, spleen, adrenals and kidneys grossly unremarkable.  There is mild distention of the gallbladder.  No visible stones, wall thickening or pericholecystic fluid.  Diffuse atrophy of the pancreas without focal abnormality.  Sigmoid diverticulosis.  No active diverticulitis.  Small bowel is decompressed as is the stomach, grossly unremarkable.  Urinary bladder unremarkable.  Prior hysterectomy.  No adnexal masses.  Aorta and iliac vessels are calcified, non-aneurysmal.  Degenerative disc and facet disease throughout the lumbar spine. No acute findings.  IMPRESSION: Left lower lobe airspace disease with small left effusion.  Cannot exclude pneumonia.  Cardiomegaly.  Sigmoid diverticulosis.   Original Report Authenticated By: Charlett Nose, M.D.   Dg Abd Acute W/chest  11/22/2012   *RADIOLOGY REPORT*  Clinical Data: Emesis.  Nausea.  Dehydration and weakness.  ACUTE ABDOMEN SERIES (ABDOMEN 2 VIEW & CHEST 1 VIEW)  Comparison: Acute abdominal series 11/02/2012.  Findings: Chronic changes of the chest are present with left subclavian three lead with CAD.  The lead appears similar. Scattered areas of pulmonary parenchymal scarring.  No free air underneath the hemidiaphragms.  Chronic blunting of the left costophrenic angle with left lower lobe collapse / consolidation which appears similar to the prior CT of 06/28/2012. Aortic arch atherosclerosis.  There is no free air underneath the hemidiaphragms.  Decubitus view is performed which also demonstrates no free air. Bowel gas pattern is nonobstructive with bowel gas extend to the level of the rectosigmoid.  Comparing to the most recent prior abdominal radiographs series, there is no interval change.  IMPRESSION: 1.  No acute abnormality. 2.  Left lower lobe collapse / consolidation appears chronic, similar to prior examinations. 3.  Nonobstructive bowel gas pattern.   Original Report Authenticated By:  Andreas Newport, M.D.   Dg Abd Acute W/chest  11/02/2012   *RADIOLOGY REPORT*  Clinical Data: Nausea, vomiting and abdominal pain  ACUTE ABDOMEN SERIES (ABDOMEN 2 VIEW & CHEST 1 VIEW)  Comparison: 08/12/2012  Findings: There is a left chest wall AICD with leads in the right atrial appendage, coronary sinus and right ventricle.  The heart size appears mildly enlarged.  A small left pleural effusion is identified.  This is similar to previous exam.  Air-filled loops of large bowel are identified.  No abnormal small bowel dilatation identified.  On the decubitus radiographs there is no evidence for free air or air-fluid levels.  IMPRESSION:  1.  Nonobstructive bowel gas pattern. 2.  Left pleural effusion.   Original Report Authenticated By: Signa Kell, M.D.    Labs:  Basic Metabolic Panel:  Recent Labs Lab 11/22/12 1723 11/23/12 0400 11/24/12 0359  NA 138 141 143  K 3.2* 3.9 4.1  CL 96 103 108  CO2 32 27 30  GLUCOSE 103* 99 98  BUN 30* 26* 17  CREATININE 1.52* 1.31* 1.20*  CALCIUM 9.4 9.2 9.1  MG 2.5  --   --  GFR Estimated Creatinine Clearance: 33.8 ml/min (by C-G formula based on Cr of 1.2).  CBC:  Recent Labs Lab 11/22/12 1723 11/23/12 0400 11/24/12 0359  WBC 7.0 7.2 4.9  NEUTROABS 4.3  --   --   HGB 12.3 11.9* 11.1*  HCT 38.0 36.7 35.0*  MCV 93.8 94.1 95.6  PLT PLATELET CLUMPS NOTED ON SMEAR, COUNT APPEARS DECREASED 80* 67*   Microbiology Recent Results (from the past 240 hour(s))  URINE CULTURE     Status: None   Collection Time    11/23/12  3:55 AM      Result Value Range Status   Specimen Description URINE, CLEAN CATCH   Final   Special Requests NONE   Final   Culture  Setup Time 11/23/2012 09:15   Final   Colony Count >=100,000 COLONIES/ML   Final   Culture ESCHERICHIA COLI   Final   Report Status 11/25/2012 FINAL   Final   Organism ID, Bacteria ESCHERICHIA COLI   Final    Time coordinating discharge: 40 minutes.  Signed:  RAMA,CHRISTINA  Pager  [phone] [facility] 11/26/2012, 10:04 AM

## 2012-11-26 NOTE — Progress Notes (Signed)
Clinical Social Work  CSW faxed DC summary to [facility] who is agreeable to accept patient after 1 pm today. CSW received [facility] authorization # [id number]. CSW prepared DC packet with DNR, hard scripts, and FL2 included. CSW informed patient, family, and RN of DC plans and PTAR pick up for 1pm. All parties agreeable and patient is excited to DC to [facility]. CSW coordinated transportation via [place]. Request # [id number]. CSW is signing off but available if needed.  Unk Lightning, LCSW (Coverage for [employer])

## 2012-11-29 ENCOUNTER — Non-Acute Institutional Stay (SKILLED_NURSING_FACILITY): Payer: Medicare Other | Admitting: Adult Health

## 2012-11-29 DIAGNOSIS — G589 Mononeuropathy, unspecified: Secondary | ICD-10-CM

## 2012-11-29 DIAGNOSIS — G629 Polyneuropathy, unspecified: Secondary | ICD-10-CM

## 2012-11-29 DIAGNOSIS — R32 Unspecified urinary incontinence: Secondary | ICD-10-CM

## 2012-11-29 DIAGNOSIS — E785 Hyperlipidemia, unspecified: Secondary | ICD-10-CM

## 2012-11-29 DIAGNOSIS — B370 Candidal stomatitis: Secondary | ICD-10-CM

## 2012-11-29 DIAGNOSIS — I5022 Chronic systolic (congestive) heart failure: Secondary | ICD-10-CM

## 2012-11-29 DIAGNOSIS — J449 Chronic obstructive pulmonary disease, unspecified: Secondary | ICD-10-CM

## 2012-11-29 DIAGNOSIS — I10 Essential (primary) hypertension: Secondary | ICD-10-CM

## 2012-11-29 DIAGNOSIS — K5900 Constipation, unspecified: Secondary | ICD-10-CM

## 2012-11-29 DIAGNOSIS — G4700 Insomnia, unspecified: Secondary | ICD-10-CM

## 2012-12-01 ENCOUNTER — Encounter: Payer: Self-pay | Admitting: Adult Health

## 2012-12-01 ENCOUNTER — Ambulatory Visit: Payer: Medicare Other

## 2012-12-01 DIAGNOSIS — B370 Candidal stomatitis: Secondary | ICD-10-CM | POA: Insufficient documentation

## 2012-12-01 DIAGNOSIS — J449 Chronic obstructive pulmonary disease, unspecified: Secondary | ICD-10-CM | POA: Insufficient documentation

## 2012-12-01 DIAGNOSIS — R32 Unspecified urinary incontinence: Secondary | ICD-10-CM | POA: Insufficient documentation

## 2012-12-01 DIAGNOSIS — E785 Hyperlipidemia, unspecified: Secondary | ICD-10-CM | POA: Insufficient documentation

## 2012-12-01 DIAGNOSIS — K5900 Constipation, unspecified: Secondary | ICD-10-CM | POA: Insufficient documentation

## 2012-12-01 DIAGNOSIS — G629 Polyneuropathy, unspecified: Secondary | ICD-10-CM | POA: Insufficient documentation

## 2012-12-01 NOTE — Progress Notes (Signed)
Patient ID: Heather Bullock, female   DOB: July 18, 1928, 79 y.o.   MRN: [id number]       PROGRESS NOTE  DATE: 11/29/2012  FACILITY: Nursing Home Location: [facility]  LEVEL OF CARE: SNF (31)   CHIEF COMPLAINT:  Follow-up hospitalization  HISTORY OF PRESENT ILLNESS: This is an 79 year old female who was being admitted to [facility] on 11-26-12 from the long hospital with principal discharge diagnoses of radiation esophagitis. She has a past medical history of non-small cell lung cancer status post radiation therapy complicated by ulcerative esophagitis. She has congestive heart failure but currently Lasix is on hold due to unstable blood pressure and acute renal failure. She also has hypertension andLosartan on hold due to unstable blood pressure. She is admitted for a short-term rehabilitation.  REASSESSMENT OF ONGOING PROBLEM(S):  CHF:The patient does not relate significant weight changes, denies sob, DOE, orthopnea, PNDs, pedal edema, palpitations or chest pain.  CHF remains stable. Lasix on hold    HTN: Pt 's HTN remains stable.  Denies CP, sob, DOE, pedal edema, headaches, dizziness or visual disturbances.  No complications from the medications currently being used.  Last BP : 118/67   PAST MEDICAL HISTORY : Reviewed.  No changes.  CURRENT MEDICATIONS: Reviewed per [facility]  REVIEW OF SYSTEMS:  GENERAL: no change in appetite, no fatigue, no weight changes, no fever, chills or weakness RESPIRATORY: no cough, SOB, DOE, wheezing, hemoptysis CARDIAC: no chest pain, edema or palpitations GI: no abdominal pain, diarrhea, constipation, heart burn, nausea or vomiting  PHYSICAL EXAMINATION  VS:  T 98       P 61      RR 18      BP 118/67           GENERAL: no acute distress, normal body habitus MOUTH :  Mild whitish coating noted on tongue and small sores in mouth NECK: supple, trachea midline, no neck masses, no thyroid tenderness, no thyromegaly LYMPHATICS: no LAN in the neck,  no supraclavicular LAN RESPIRATORY: breathing is even & unlabored, BS CTAB CARDIAC: RRR, no murmur,no extra heart sounds, no edema GI: abdomen soft, normal BS, no masses, no tenderness, no hepatomegaly, no splenomegaly PSYCHIATRIC: the patient is alert & oriented to person, affect & behavior appropriate  LABS/RADIOLOGY: 11/24/12 sodium 143 potassium 4.1 glucose 98 BUN 17 creatinine 1.20 culture 9.1 WBC 4.9 hemoglobin 11.1 hematocrit 35.0 11/22/12 thoracic x-ray shows no fracture; lower spine x-ray shows no fracture but has stable mild compression of L2; sacral x-ray shows no fracture   ASSESSMENT/PLAN:  Candida infection, oral - continue Mycostatin  Radiation esophagitis - stable; continue Magic mouthwash  Constipation - no complaints  COPD - stable  Hyperlipidemia - continue Pravachol  Neuropathy - well controlled  Chronic CHF - well compensated  Urinary incontinence - stable  Insomnia - no complaints    CPT CODE: 40102

## 2012-12-02 ENCOUNTER — Non-Acute Institutional Stay (SKILLED_NURSING_FACILITY): Payer: Medicare Other | Admitting: Internal Medicine

## 2012-12-02 DIAGNOSIS — C3430 Malignant neoplasm of lower lobe, unspecified bronchus or lung: Secondary | ICD-10-CM

## 2012-12-02 DIAGNOSIS — I10 Essential (primary) hypertension: Secondary | ICD-10-CM

## 2012-12-02 DIAGNOSIS — D630 Anemia in neoplastic disease: Secondary | ICD-10-CM

## 2012-12-09 ENCOUNTER — Other Ambulatory Visit: Payer: Self-pay | Admitting: *Deleted

## 2012-12-09 ENCOUNTER — Non-Acute Institutional Stay (SKILLED_NURSING_FACILITY): Payer: Medicare Other | Admitting: Adult Health

## 2012-12-09 DIAGNOSIS — I509 Heart failure, unspecified: Secondary | ICD-10-CM

## 2012-12-09 DIAGNOSIS — J189 Pneumonia, unspecified organism: Secondary | ICD-10-CM

## 2012-12-09 DIAGNOSIS — I5022 Chronic systolic (congestive) heart failure: Secondary | ICD-10-CM

## 2012-12-09 MED ORDER — FUROSEMIDE 40 MG PO TABS: 40.0000 mg | ORAL_TABLET | Freq: Two times a day (BID) | ORAL | Status: DC

## 2012-12-13 ENCOUNTER — Other Ambulatory Visit: Payer: Self-pay | Admitting: Geriatric Medicine

## 2012-12-13 MED ORDER — HYDROCODONE-ACETAMINOPHEN 7.5-325 MG/15ML PO SOLN: 15.0000 mL | ORAL | Status: AC | PRN

## 2012-12-14 ENCOUNTER — Non-Acute Institutional Stay (SKILLED_NURSING_FACILITY): Payer: Medicare Other | Admitting: Adult Health

## 2012-12-14 ENCOUNTER — Encounter: Payer: Self-pay | Admitting: Adult Health

## 2012-12-14 DIAGNOSIS — E785 Hyperlipidemia, unspecified: Secondary | ICD-10-CM

## 2012-12-14 DIAGNOSIS — J189 Pneumonia, unspecified organism: Secondary | ICD-10-CM | POA: Insufficient documentation

## 2012-12-14 DIAGNOSIS — I509 Heart failure, unspecified: Secondary | ICD-10-CM | POA: Insufficient documentation

## 2012-12-14 DIAGNOSIS — I5022 Chronic systolic (congestive) heart failure: Secondary | ICD-10-CM

## 2012-12-14 DIAGNOSIS — G629 Polyneuropathy, unspecified: Secondary | ICD-10-CM

## 2012-12-14 DIAGNOSIS — K5900 Constipation, unspecified: Secondary | ICD-10-CM

## 2012-12-14 DIAGNOSIS — G589 Mononeuropathy, unspecified: Secondary | ICD-10-CM

## 2012-12-14 DIAGNOSIS — J449 Chronic obstructive pulmonary disease, unspecified: Secondary | ICD-10-CM

## 2012-12-14 DIAGNOSIS — K208 Other esophagitis without bleeding: Secondary | ICD-10-CM

## 2012-12-14 DIAGNOSIS — J4489 Other specified chronic obstructive pulmonary disease: Secondary | ICD-10-CM

## 2012-12-14 DIAGNOSIS — J984 Other disorders of lung: Secondary | ICD-10-CM | POA: Insufficient documentation

## 2012-12-14 DIAGNOSIS — R32 Unspecified urinary incontinence: Secondary | ICD-10-CM

## 2012-12-14 DIAGNOSIS — I10 Essential (primary) hypertension: Secondary | ICD-10-CM

## 2012-12-14 NOTE — Progress Notes (Addendum)
Patient ID: Heather Bullock, female   DOB: [birth year], 79 y.o.   MRN: [id number]       PROGRESS NOTE  DATE: 12/14/2012   FACILITY: [facility]  LEVEL OF CARE: SNF (31)  Discharge Visit  CHIEF COMPLAINT:  Manage radiation esophagitis, hypertension, chronic systolic CHF, COPD and neuropathy  HISTORY OF PRESENT ILLNESS: This is an 79 year old female who is for discharge home with home health PT and nursing. She was admitted to [facility] on 11-26-12 from [facility] with principal discharge diagnoses of radiation esophagitis. She has a past medical history of non-small cell lung cancer status post radiation therapy complicated by ulcerative esophagitis. She has completed SNF rehabilitation and therapy has cleared the patient for discharge.  Reassessment of ongoing problem(s):  HTN: Pt 's HTN remains stable.  Denies CP, sob, DOE, pedal edema, headaches, dizziness or visual disturbances.  No complications from the medications currently being used.  Last BP : 130/71  PERIPHERAL NEUROPATHY: The peripheral neuropathy is stable. The patient denies pain in the feet, tingling, and numbness. No complications noted from the medication presently being used.  COPD: the COPD remains stable.  Pt denies sob, cough, wheezing or declining exercise tolerance.  No complications from the medications presently being used.  PAST MEDICAL HISTORY : Reviewed.  No changes.   CURRENT MEDICATIONS: Reviewed per [facility]  REVIEW OF SYSTEMS:  GENERAL: no change in appetite, no fatigue, no weight changes, no fever, chills or weakness RESPIRATORY: no cough, SOB, DOE, wheezing, hemoptysis CARDIAC: no chest pain, or palpitations, + edema GI: no abdominal pain, diarrhea, constipation, heart burn, nausea or vomiting  PHYSICAL EXAMINATION  VS:  T99.6       P 69       RR 23      BP 130/71           WT 153.4 (Lb)  GENERAL: no acute distress, normal body habitus EYES: conjunctivae normal, sclerae normal,  normal eye lids NECK: supple, trachea midline, no neck masses, no thyroid tenderness, no thyromegaly LYMPHATICS: no LAN in the neck, no supraclavicular LAN RESPIRATORY: breathing is even & unlabored, BS CTAB CARDIAC: RRR, no murmur,no extra heart sounds, BLE edema, 2+ GI: abdomen soft, normal BS, no masses, no tenderness, no hepatomegaly, no splenomegaly PSYCHIATRIC: the patient is alert & oriented to person, affect & behavior appropriate  LABS/RADIOLOGY: 12/03/12 sodium 141 potassium 3.1 glucose 89 BUN 8 creatinine 0.8 albumin 2.7 calcium 9.1 WBC 5.5 hemoglobin 10.7 hematocrit 35.2   ASSESSMENT/PLAN:  Radiation esophagitis - stable  Constipation - no complaints  COPD - stable  Hyperlipidemia - continue Pravachol  Neuropathy - well controlled  Chronic systolic CHF - well compensated  Urinary incontinence - stable  Insomnia - no complaints  Hypertension - well controlled  I have filled out patient's discharge paperwork and written prescriptions.  Patient will receive home health PT and Nursing.   Total discharge time: Less than 30 minutes Discharge time involved coordination of the discharge process with Child psychotherapist, nursing staff and therapy department. Medical justification for home health services/DME verified.   CPT CODE: 40981

## 2012-12-14 NOTE — Progress Notes (Signed)
Patient ID: Heather Bullock, female   DOB: [birth year], 79 y.o.   MRN: [id number]        PROGRESS NOTE  DATE: 12/09/2012  FACILITY:  [facility]  LEVEL OF CARE: SNF (31)  Acute Visit  CHIEF COMPLAINT:  Manage Pneumonitis  HISTORY OF PRESENT ILLNESS: This is an 79 year old female who complains of bilateral lower extremity edema, 2+. She has a history of congestive heart failure. Noted some wheezing on the left upper lung fields and she complains of coughing in the morning. No reported fever nor complaints of chest pain. Chest x-ray shows pneumonitis.  PAST MEDICAL HISTORY : Reviewed.  No changes.  CURRENT MEDICATIONS: Reviewed per [facility]  REVIEW OF SYSTEMS:  GENERAL: no change in appetite, no fatigue, no weight changes, no fever, chills or weakness RESPIRATORY: no cough, SOB, DOE, hemoptysis, + wheezing CARDIAC: no chest pain or palpitations, + edema GI: no abdominal pain, diarrhea, constipation, heart burn, nausea or vomiting  PHYSICAL EXAMINATION  VS:  T 97.6        P 68       RR 20       BP 124/60 TO      POX 95% %       WT 155.4 (Lb)  GENERAL: no acute distress, normal body habitus EYES: conjunctivae normal, sclerae normal, normal eye lids NECK: supple, trachea midline, no neck masses, no thyroid tenderness, no thyromegaly LYMPHATICS: no LAN in the neck, no supraclavicular LAN RESPIRATORY: breathing is even & unlabored, wheezing on left upper lung fields CARDIAC: RRR, no murmur,no extra heart sounds, BLE edema, 2+ GI: abdomen soft, normal BS, no masses, no tenderness, no hepatomegaly, no splenomegaly PSYCHIATRIC: the patient is alert & oriented to person, affect & behavior appropriate  LABS/RADIOLOGY: 12/03/12 sodium 141 potassium 3.1 glucose 89 BUN 8 creatinine 0.8 albumin 2.7 calcium 9.1 WBC 5.5 hemoglobin 10.7 hematocrit 35.2   ASSESSMENT/PLAN:  Pneumonitis  -  start doxycycline 100 mg by mouth twice a day x7 days  CHF - start Lasix 20 mg one tab by mouth  daily; BMP in one week   CPT CODE: 14782

## 2012-12-15 ENCOUNTER — Ambulatory Visit (HOSPITAL_COMMUNITY): Payer: Medicare Other

## 2012-12-16 ENCOUNTER — Ambulatory Visit: Payer: Medicare Other | Admitting: Radiation Oncology

## 2012-12-17 ENCOUNTER — Telehealth: Payer: Self-pay | Admitting: Dietician

## 2012-12-20 ENCOUNTER — Ambulatory Visit: Payer: Medicare Other | Admitting: Pulmonary Disease

## 2012-12-22 ENCOUNTER — Inpatient Hospital Stay (HOSPITAL_COMMUNITY)
Admission: EM | Admit: 2012-12-22 | Discharge: 2012-12-27 | DRG: 175 | Disposition: A | Discharge status: Home Health | Payer: Medicare Other | Attending: Internal Medicine | Admitting: Internal Medicine

## 2012-12-22 ENCOUNTER — Encounter (HOSPITAL_COMMUNITY): Payer: Self-pay | Admitting: Emergency Medicine

## 2012-12-22 ENCOUNTER — Other Ambulatory Visit: Payer: Self-pay

## 2012-12-22 ENCOUNTER — Encounter: Payer: Self-pay | Admitting: Radiation Oncology

## 2012-12-22 ENCOUNTER — Ambulatory Visit (HOSPITAL_COMMUNITY)
Admission: RE | Admit: 2012-12-22 | Discharge: 2012-12-22 | Disposition: A | Discharge status: Home / Self Care | Payer: Medicare Other | Source: Ambulatory Visit | Attending: Radiation Oncology | Admitting: Radiation Oncology

## 2012-12-22 DIAGNOSIS — Z96659 Presence of unspecified artificial knee joint: Secondary | ICD-10-CM

## 2012-12-22 DIAGNOSIS — I712 Thoracic aortic aneurysm, without rupture, unspecified: Secondary | ICD-10-CM | POA: Insufficient documentation

## 2012-12-22 DIAGNOSIS — I428 Other cardiomyopathies: Secondary | ICD-10-CM | POA: Diagnosis present

## 2012-12-22 DIAGNOSIS — I2699 Other pulmonary embolism without acute cor pulmonale: Secondary | ICD-10-CM

## 2012-12-22 DIAGNOSIS — I700 Atherosclerosis of aorta: Secondary | ICD-10-CM | POA: Insufficient documentation

## 2012-12-22 DIAGNOSIS — Z79899 Other long term (current) drug therapy: Secondary | ICD-10-CM

## 2012-12-22 DIAGNOSIS — D630 Anemia in neoplastic disease: Secondary | ICD-10-CM

## 2012-12-22 DIAGNOSIS — Z923 Personal history of irradiation: Secondary | ICD-10-CM

## 2012-12-22 DIAGNOSIS — J90 Pleural effusion, not elsewhere classified: Secondary | ICD-10-CM | POA: Insufficient documentation

## 2012-12-22 DIAGNOSIS — J449 Chronic obstructive pulmonary disease, unspecified: Secondary | ICD-10-CM

## 2012-12-22 DIAGNOSIS — Z95810 Presence of automatic (implantable) cardiac defibrillator: Secondary | ICD-10-CM

## 2012-12-22 DIAGNOSIS — D638 Anemia in other chronic diseases classified elsewhere: Secondary | ICD-10-CM

## 2012-12-22 DIAGNOSIS — I509 Heart failure, unspecified: Secondary | ICD-10-CM

## 2012-12-22 DIAGNOSIS — Y842 Radiological procedure and radiotherapy as the cause of abnormal reaction of the patient, or of later complication, without mention of misadventure at the time of the procedure: Secondary | ICD-10-CM | POA: Diagnosis present

## 2012-12-22 DIAGNOSIS — I2692 Saddle embolus of pulmonary artery without acute cor pulmonale: Secondary | ICD-10-CM | POA: Insufficient documentation

## 2012-12-22 DIAGNOSIS — I10 Essential (primary) hypertension: Secondary | ICD-10-CM | POA: Insufficient documentation

## 2012-12-22 DIAGNOSIS — J4489 Other specified chronic obstructive pulmonary disease: Secondary | ICD-10-CM | POA: Diagnosis present

## 2012-12-22 DIAGNOSIS — K208 Other esophagitis without bleeding: Secondary | ICD-10-CM | POA: Diagnosis present

## 2012-12-22 DIAGNOSIS — C3430 Malignant neoplasm of lower lobe, unspecified bronchus or lung: Secondary | ICD-10-CM | POA: Diagnosis present

## 2012-12-22 DIAGNOSIS — I5022 Chronic systolic (congestive) heart failure: Secondary | ICD-10-CM

## 2012-12-22 DIAGNOSIS — R1310 Dysphagia, unspecified: Secondary | ICD-10-CM | POA: Diagnosis present

## 2012-12-22 DIAGNOSIS — N179 Acute kidney failure, unspecified: Secondary | ICD-10-CM

## 2012-12-22 DIAGNOSIS — IMO0001 Reserved for inherently not codable concepts without codable children: Secondary | ICD-10-CM | POA: Diagnosis present

## 2012-12-22 DIAGNOSIS — I5023 Acute on chronic systolic (congestive) heart failure: Secondary | ICD-10-CM | POA: Diagnosis present

## 2012-12-22 LAB — CBC WITH DIFFERENTIAL/PLATELET
Eosinophils Absolute: 0.3 10*3/uL (ref 0.0–0.7)
Eosinophils Relative: 4 % (ref 0–5)
HCT: 37 % (ref 36.0–46.0)
Lymphs Abs: 1.2 10*3/uL (ref 0.7–4.0)
MCH: 31.3 pg (ref 26.0–34.0)
MCV: 94.9 fL (ref 78.0–100.0)
Monocytes Absolute: 0.8 10*3/uL (ref 0.1–1.0)
Monocytes Relative: 13 % — ABNORMAL HIGH (ref 3–12)
Platelets: 217 10*3/uL (ref 150–400)
RBC: 3.9 MIL/uL (ref 3.87–5.11)

## 2012-12-22 LAB — BASIC METABOLIC PANEL
BUN: 15 mg/dL (ref 6–23)
CO2: 31 mEq/L (ref 19–32)
Calcium: 9.2 mg/dL (ref 8.4–10.5)
Creatinine, Ser: 1.14 mg/dL — ABNORMAL HIGH (ref 0.50–1.10)
GFR calc non Af Amer: 44 mL/min — ABNORMAL LOW (ref >90)
Glucose, Bld: 111 mg/dL — ABNORMAL HIGH (ref 70–99)

## 2012-12-22 MED ORDER — SODIUM CHLORIDE 0.9 % IV SOLN: 250.0000 mL | INTRAVENOUS | Status: DC | PRN

## 2012-12-22 MED ORDER — HEPARIN (PORCINE) IN NACL 100-0.45 UNIT/ML-% IJ SOLN
1000.0000 [IU]/h | INTRAMUSCULAR | Status: DC
  Administered 2012-12-22 – 2012-12-23 (×2): 1000 [IU]/h via INTRAVENOUS
  Filled 2012-12-22 (×3): qty 250

## 2012-12-22 MED ORDER — POTASSIUM CHLORIDE 10 MEQ/100ML IV SOLN
10.0000 meq | INTRAVENOUS | Status: AC
  Administered 2012-12-22 – 2012-12-23 (×4): 10 meq via INTRAVENOUS
  Filled 2012-12-22 (×4): qty 100

## 2012-12-22 MED ORDER — HYDROCODONE-ACETAMINOPHEN 7.5-325 MG/15ML PO SOLN
15.0000 mL | ORAL | Status: DC | PRN
  Administered 2012-12-22 – 2012-12-27 (×20): 15 mL via ORAL
  Filled 2012-12-22: qty 30
  Filled 2012-12-22 (×19): qty 15

## 2012-12-22 MED ORDER — HEPARIN BOLUS VIA INFUSION: 3000.0000 [IU] | Freq: Once | INTRAVENOUS | Status: DC

## 2012-12-22 MED ORDER — LOSARTAN POTASSIUM 25 MG PO TABS
12.5000 mg | ORAL_TABLET | Freq: Two times a day (BID) | ORAL | Status: DC
  Administered 2012-12-22 – 2012-12-27 (×10): 12.5 mg via ORAL
  Filled 2012-12-22 (×11): qty 0.5

## 2012-12-22 MED ORDER — SODIUM CHLORIDE 0.9 % IJ SOLN
3.0000 mL | Freq: Two times a day (BID) | INTRAMUSCULAR | Status: DC
  Administered 2012-12-23 – 2012-12-26 (×4): 3 mL via INTRAVENOUS

## 2012-12-22 MED ORDER — SENNOSIDES-DOCUSATE SODIUM 8.6-50 MG PO TABS
1.0000 | ORAL_TABLET | Freq: Every evening | ORAL | Status: DC | PRN
  Administered 2012-12-26: 1 via ORAL
  Filled 2012-12-22: qty 1

## 2012-12-22 MED ORDER — VITAMIN C 500 MG PO TABS
1000.0000 mg | ORAL_TABLET | Freq: Every day | ORAL | Status: DC
  Administered 2012-12-23 – 2012-12-27 (×5): 1000 mg via ORAL
  Filled 2012-12-22 (×5): qty 2

## 2012-12-22 MED ORDER — SIMVASTATIN 5 MG PO TABS
5.0000 mg | ORAL_TABLET | Freq: Every day | ORAL | Status: DC
  Administered 2012-12-23 – 2012-12-26 (×4): 5 mg via ORAL
  Filled 2012-12-22 (×5): qty 1

## 2012-12-22 MED ORDER — CARVEDILOL 3.125 MG PO TABS
3.1250 mg | ORAL_TABLET | Freq: Two times a day (BID) | ORAL | Status: DC
  Administered 2012-12-23 – 2012-12-27 (×9): 3.125 mg via ORAL
  Filled 2012-12-22 (×11): qty 1

## 2012-12-22 MED ORDER — PANTOPRAZOLE SODIUM 40 MG PO TBEC
40.0000 mg | DELAYED_RELEASE_TABLET | Freq: Two times a day (BID) | ORAL | Status: DC
  Administered 2012-12-22 – 2012-12-27 (×10): 40 mg via ORAL
  Filled 2012-12-22 (×11): qty 1

## 2012-12-22 MED ORDER — SODIUM CHLORIDE 0.9 % IJ SOLN: 3.0000 mL | INTRAMUSCULAR | Status: DC | PRN

## 2012-12-22 MED ORDER — ZOLPIDEM TARTRATE 5 MG PO TABS
5.0000 mg | ORAL_TABLET | Freq: Once | ORAL | Status: AC
  Administered 2012-12-23: 5 mg via ORAL
  Filled 2012-12-22: qty 1

## 2012-12-22 MED ORDER — SODIUM CHLORIDE 0.9 % IJ SOLN: 3.0000 mL | Freq: Two times a day (BID) | INTRAMUSCULAR | Status: DC

## 2012-12-22 MED ORDER — ALBUTEROL SULFATE (5 MG/ML) 0.5% IN NEBU: 2.5000 mg | INHALATION_SOLUTION | RESPIRATORY_TRACT | Status: DC | PRN

## 2012-12-22 MED ORDER — GABAPENTIN 300 MG PO CAPS
300.0000 mg | ORAL_CAPSULE | Freq: Two times a day (BID) | ORAL | Status: DC
  Administered 2012-12-22 – 2012-12-27 (×10): 300 mg via ORAL
  Filled 2012-12-22 (×11): qty 1

## 2012-12-22 MED ORDER — HEPARIN (PORCINE) IN NACL 100-0.45 UNIT/ML-% IJ SOLN
1000.0000 [IU]/h | INTRAMUSCULAR | Status: DC
  Filled 2012-12-22: qty 250

## 2012-12-22 MED ORDER — ACETAMINOPHEN 650 MG RE SUPP: 650.0000 mg | Freq: Four times a day (QID) | RECTAL | Status: DC | PRN

## 2012-12-22 MED ORDER — IOHEXOL 300 MG/ML  SOLN
80.0000 mL | Freq: Once | INTRAMUSCULAR | Status: AC | PRN
  Administered 2012-12-22: 80 mL via INTRAVENOUS

## 2012-12-22 MED ORDER — ONDANSETRON HCL 4 MG PO TABS: 4.0000 mg | ORAL_TABLET | Freq: Four times a day (QID) | ORAL | Status: DC | PRN

## 2012-12-22 MED ORDER — ACETAMINOPHEN 325 MG PO TABS: 650.0000 mg | ORAL_TABLET | Freq: Four times a day (QID) | ORAL | Status: DC | PRN

## 2012-12-22 MED ORDER — HEPARIN BOLUS VIA INFUSION
3000.0000 [IU] | Freq: Once | INTRAVENOUS | Status: AC
  Administered 2012-12-22: 3000 [IU] via INTRAVENOUS
  Filled 2012-12-22: qty 3000

## 2012-12-22 MED ORDER — ADULT MULTIVITAMIN W/MINERALS CH
1.0000 | ORAL_TABLET | Freq: Every day | ORAL | Status: DC
  Administered 2012-12-23 – 2012-12-27 (×5): 1 via ORAL
  Filled 2012-12-22 (×5): qty 1

## 2012-12-22 MED ORDER — DIAZEPAM 5 MG PO TABS
5.0000 mg | ORAL_TABLET | Freq: Every evening | ORAL | Status: DC | PRN
  Administered 2012-12-23 – 2012-12-26 (×2): 5 mg via ORAL
  Filled 2012-12-22 (×3): qty 1

## 2012-12-22 MED ORDER — SALMETEROL XINAFOATE 50 MCG/DOSE IN AEPB
1.0000 | INHALATION_SPRAY | Freq: Two times a day (BID) | RESPIRATORY_TRACT | Status: DC
  Administered 2012-12-22 – 2012-12-27 (×10): 1 via RESPIRATORY_TRACT
  Filled 2012-12-22: qty 0

## 2012-12-22 MED ORDER — DICLOFENAC EPOLAMINE 1.3 % TD PTCH
1.0000 | MEDICATED_PATCH | Freq: Every day | TRANSDERMAL | Status: DC | PRN
  Filled 2012-12-22: qty 1

## 2012-12-22 MED ORDER — VITAMIN D (ERGOCALCIFEROL) 1.25 MG (50000 UNIT) PO CAPS
50000.0000 [IU] | ORAL_CAPSULE | ORAL | Status: DC
  Administered 2012-12-27: 50000 [IU] via ORAL
  Filled 2012-12-22: qty 1

## 2012-12-22 MED ORDER — OXYBUTYNIN CHLORIDE 5 MG PO TABS
5.0000 mg | ORAL_TABLET | Freq: Every day | ORAL | Status: DC | PRN
  Filled 2012-12-22: qty 1

## 2012-12-22 MED ORDER — FUROSEMIDE 40 MG PO TABS
40.0000 mg | ORAL_TABLET | Freq: Two times a day (BID) | ORAL | Status: DC
  Administered 2012-12-22 – 2012-12-24 (×4): 40 mg via ORAL
  Filled 2012-12-22 (×6): qty 1

## 2012-12-22 MED ORDER — ONDANSETRON HCL 4 MG/2ML IJ SOLN: 4.0000 mg | Freq: Four times a day (QID) | INTRAMUSCULAR | Status: DC | PRN

## 2012-12-22 NOTE — ED Provider Notes (Addendum)
CSN: [id number]     Arrival date & time 12/22/12  1508 History   First MD Initiated Contact with Patient 12/22/12 1559     No chief complaint on file.  (Consider location/radiation/quality/duration/timing/severity/associated sxs/prior Treatment) HPI Comments: Patient was sent to the ER after having a routine CAT scan. Patient was treated with radiation therapy for lung neoplasm. She had a routine CAT scan performed today prior to followup with her radiation oncologist. She was called at home and told that she needed to come to the ER because she had a blood clot seen on the CAT scan.  Patient denies chest pain. She endorses shortness of breath, but reports that she is only short of breath and it has not changed. She has not had any noticeable swelling of either of her legs.   Past Medical History  Diagnosis Date  . LBBB (left bundle branch block)   . Nonischemic cardiomyopathy     EF 25-30% echo 2010, 45% echo 2012  . Hypertension   . Diverticulitis   . ICD (implantable cardiac defibrillator) in place     CRT-D [employer]  . Chronic fatigue   . Fibromyalgia   . Shortness of breath     followed by Dr. Shelle Iron  . Elevated uric acid   . CRI (chronic renal insufficiency)   . Abdominal bloating   . Osteopenia   . CHF (congestive heart failure)   . Arthritis   . COPD (chronic obstructive pulmonary disease)   . Asthma   . Non-small cell lung cancer    Past Surgical History  Procedure Laterality Date  . Crt-d-st. jude's    . Replacement total knee    . Abdominal hysterectomy    . Appendectomy    . Neck surgery    . Tonsillectomy    . Neck surgery  02/2007    cervical  . Laser surgery left eye  2013  . Oral surgery/infection in root of teeth  2013  . Video bronchoscopy Bilateral 08/12/2012    Procedure: VIDEO BRONCHOSCOPY WITHOUT FLUORO;  Surgeon: Barbaraann Share, MD;  Location: [facility];  Service: Cardiopulmonary;  Laterality: Bilateral;  . Esophagogastroduodenoscopy N/A  11/05/2012    Procedure: ESOPHAGOGASTRODUODENOSCOPY (EGD);  Surgeon: Vertell Novak., MD;  Location: [facility];  Service: Endoscopy;  Laterality: N/A;   Family History  Problem Relation Age of Onset  . Heart disease Son   . Colon cancer Sister   . Lung cancer Father   . Heart disease Mother   . Heart disease Brother   . Hypertension Mother   . Diabetes Brother    History  Substance Use Topics  . Smoking status: Never Smoker   . Smokeless tobacco: Never Used  . Alcohol Use: No   OB History   Grav Para Term Preterm Abortions TAB SAB Ect Mult Living   3 3        2      Review of Systems  Respiratory: Positive for shortness of breath.   Cardiovascular: Negative for chest pain.  All other systems reviewed and are negative.    Allergies  Other; Bacitracin; Gelatin; Imuran; Methylsulfonylmethane; Metoclopramide hcl; Nabumetone; Neomycin-bacitracin zn-polymyx; Nitroglycerin; Nsaids; Nutritional supplements; Plaquenil; and Tizanidine  Home Medications   Current Outpatient Rx  Name  Route  Sig  Dispense  Refill  . albuterol (PROVENTIL) (5 MG/ML) 0.5% nebulizer solution   Nebulization   Take 0.5 mLs (2.5 mg total) by nebulization every 4 (four) hours as needed for wheezing or  shortness of breath.   20 mL   12   . Alum & Mag Hydroxide-Simeth (MAGIC MOUTHWASH W/LIDOCAINE) SOLN   Oral   Take 10 mLs by mouth 4 (four) times daily -  before meals and at bedtime.         . Alum & Mag Hydroxide-Simeth (MAGIC MOUTHWASH W/LIDOCAINE) SOLN   Oral   Take 5 mLs by mouth 4 (four) times daily -  before meals and at bedtime.      0   . antiseptic oral rinse (BIOTENE) LIQD   Mouth Rinse   15 mLs by Mouth Rinse route 2 (two) times daily.         . Ascorbic Acid (VITAMIN C) 500 MG tablet   Oral   Take 1,000 mg by mouth daily.          . bisacodyl (DULCOLAX) 10 MG suppository   Rectal   Place 10 mg rectally as needed for constipation.         . calcium-vitamin D (OSCAL  WITH D) 500-200 MG-UNIT per tablet   Oral   Take 1 tablet by mouth every morning.         . carvedilol (COREG) 3.125 MG tablet   Oral   Take 1 tablet (3.125 mg total) by mouth 2 (two) times daily with a meal.   180 tablet   6   . diazepam (VALIUM) 5 MG tablet   Oral   Take 1 tablet (5 mg total) by mouth every 6 (six) hours as needed for anxiety.   30 tablet   0   . docusate sodium (COLACE) 100 MG capsule   Oral   Take 100 mg by mouth every morning.         . docusate sodium 100 MG CAPS   Oral   Take 100 mg by mouth daily.   10 capsule   0   . feeding supplement (BOOST HIGH PROTEIN) LIQD   Oral   Take 1 Container by mouth 5 (five) times daily.         . formoterol (FORADIL AEROLIZER) 12 MCG capsule for inhaler   Inhalation   Place 1 capsule (12 mcg total) into inhaler and inhale 2 (two) times daily.   60 capsule   6   . furosemide (LASIX) 40 MG tablet   Oral   Take 1 tablet (40 mg total) by mouth 2 (two) times daily. Take one tablet twice daily   180 tablet   3   . gabapentin (NEURONTIN) 300 MG capsule   Oral   Take 300 mg by mouth 2 (two) times daily.          . Glucosamine-Chondroitin (OSTEO BI-FLEX REGULAR STRENGTH PO)   Oral   Take 2 tablets by mouth daily.          Marland Kitchen Hyaluronate Sodium (BIONECT) 0.2 % GEL   Apply externally   Apply 1 application topically 2 (two) times daily. Apply to torso for burning.         Marland Kitchen HYDROcodone-acetaminophen (HYCET) 7.5-325 mg/15 ml solution   Oral   Take 15 mL by mouth every 4 (four) hours as needed for pain.   240 mL   5   . lidocaine (LIDODERM) 5 %   Transdermal   Place 1 patch onto the skin every morning. Remove & Discard patch within 12 hours or as directed by MD         . magnesium hydroxide (MILK OF MAGNESIA) 400  MG/5ML suspension   Oral   Take 5 mLs by mouth daily as needed for constipation.         . Multiple Vitamin (MULTIVITAMIN) LIQD   Oral   Take 5 mLs by mouth daily.   1 Bottle    0   . nystatin (MYCOSTATIN) 100000 UNIT/ML suspension   Oral   Take 5 mLs (500,000 Units total) by mouth 4 (four) times daily -  before meals and at bedtime.   60 mL   0   . ondansetron (ZOFRAN) 8 MG tablet   Oral   Take 1 tablet (8 mg total) by mouth every 8 (eight) hours as needed for nausea.   20 tablet   0   . oxybutynin (DITROPAN) 5 MG tablet   Oral   Take 5 mg by mouth daily.           . pantoprazole (PROTONIX) 40 MG tablet   Oral   Take 1 tablet (40 mg total) by mouth 2 (two) times daily.   60 tablet   3   . polyethylene glycol powder (MIRALAX) powder   Oral   Take 17 g by mouth daily.   255 g   5   . pravastatin (PRAVACHOL) 40 MG tablet   Oral   Take 40 mg by mouth every evening.         . senna (SENOKOT) 8.6 MG TABS   Oral   Take 1 tablet by mouth daily as needed (Constipation).         . sucralfate (CARAFATE) 1 GM/10ML suspension   Oral   Take 10 mLs (1 g total) by mouth 4 (four) times daily -  with meals and at bedtime.   420 mL   0   . triamcinolone (KENALOG) 0.025 % cream   Topical   Apply 1 application topically 3 (three) times daily as needed (itching). Apply to back         . Vitamin D, Ergocalciferol, (DRISDOL) 50000 UNITS CAPS   Oral   Take 50,000 Units by mouth every 7 (seven) days.           Marland Kitchen zolpidem (AMBIEN) 5 MG tablet   Oral   Take 1 tablet (5 mg total) by mouth at bedtime.   30 tablet   0    BP 121/52  Pulse 72  Temp(Src) 98.4 F (36.9 C) (Oral)  Resp 20  SpO2 98% Physical Exam  Constitutional: She is oriented to person, place, and time. She appears well-developed and well-nourished. No distress.  HENT:  Head: Normocephalic and atraumatic.  Right Ear: Hearing normal.  Left Ear: Hearing normal.  Nose: Nose normal.  Mouth/Throat: Oropharynx is clear and moist and mucous membranes are normal.  Eyes: Conjunctivae and EOM are normal. Pupils are equal, round, and reactive to light.  Neck: Normal range of motion.  Neck supple.  Cardiovascular: Regular rhythm, S1 normal and S2 normal.  Exam reveals no gallop and no friction rub.   No murmur heard. Pulmonary/Chest: Effort normal. No respiratory distress. She has decreased breath sounds. She exhibits no tenderness.  Abdominal: Soft. Normal appearance and bowel sounds are normal. There is no hepatosplenomegaly. There is no tenderness. There is no rebound, no guarding, no tenderness at McBurney's point and negative Murphy's sign. No hernia.  Musculoskeletal: Normal range of motion.  Neurological: She is alert and oriented to person, place, and time. She has normal strength. No cranial nerve deficit or sensory deficit. Coordination normal. GCS eye  subscore is 4. GCS verbal subscore is 5. GCS motor subscore is 6.  Skin: Skin is warm, dry and intact. No rash noted. No cyanosis.  Psychiatric: She has a normal mood and affect. Her speech is normal and behavior is normal. Thought content normal.    ED Course  Procedures (including critical care time)  EKG:  Date: 12/22/2012  Rate: 61  Rhythm: atrial paced complexes     Labs Review Labs Reviewed  CBC WITH DIFFERENTIAL - Abnormal; Notable for the following:    RDW 15.9 (*)    Monocytes Relative 13 (*)    All other components within normal limits  BASIC METABOLIC PANEL - Abnormal; Notable for the following:    Sodium 132 (*)    Potassium 2.9 (*)    Chloride 91 (*)    Glucose, Bld 111 (*)    Creatinine, Ser 1.14 (*)    GFR calc non Af Amer 44 (*)    GFR calc Af Amer 50 (*)    All other components within normal limits  TROPONIN I  PRO B NATRIURETIC PEPTIDE   Imaging Review Ct Chest W Contrast  12/22/2012   *RADIOLOGY REPORT*  Clinical Data: Non-small cell lung cancer post radiation therapy, history COPD, hypertension, nonischemic cardiomyopathy, CHF, asthma  CT CHEST WITH CONTRAST  Technique:  Multidetector CT imaging of the chest was performed following the standard protocol during bolus  administration of intravenous contrast. Sagittal and coronal MPR images reconstructed from axial data set.  Contrast: 80mL OMNIPAQUE IOHEXOL 300 MG/ML  SOLN Dilute oral contrast.  Comparison: 06/28/2012, PET CT 07/13/2012  Findings: Left subclavian transvenous pacemaker leads within right atrium, right ventricle and coronary sinus. Atherosclerotic calcifications aorta with mild aneurysmal dilatation of ascending thoracic aorta 4.0 x 4.0 cm image 27. Large saddle embolus identified at bifurcation of right pulmonary artery. Additional bilateral lower lobe pulmonary emboli. Small probable cyst left lobe liver image 45 unchanged. Remaining visualized portions of upper abdomen unremarkable.  Again identified large area of opacity at the medial left lower lobe containing calcified granulomata which may represent a combination of progressive atelectasis and underlying tumor. Borderline enlarged precarinal lymph node 10 mm image 21 previously 11 mm. Borderline enlarged AP window lymph node 10 mm image 23 previously 8 mm. Few additional calcified mediastinal lymph nodes compatible with granulomatous disease. Scattered peribronchial thickening with dependent atelectasis and right lower lobe. Scattered areas of ground-glass opacity in both lungs appear grossly unchanged. No new infiltrate, pleural effusion, or pneumothorax. Bones appear diffusely demineralized  IMPRESSION: Pulmonary embolism including saddle embolus at the bifurcation of the right pulmonary artery as well emboli in bilateral lower lobes. Small partially loculated left basilar pleural effusion new since previous exam. Increased size of left lower lobe opacity likely representing combination of atelectasis and underlying tumor. Stable upper normal-sized AP window and precarinal lymph nodes. Esophageal wall thickening distally potentially related to radiation therapy change.  Critical Value/emergent results were called by telephone at the time of interpretation  on 12/22/2012 at 1356 hours to Sonda Rumble RN at the [facility], who verbally acknowledged these results.   Original Report Authenticated By: Ulyses Southward, M.D.    MDM  Diagnosis: PE  Patient referred to the ER by her radiation oncologist for evaluation after routine CT showed large saddle embolism. Patient endorses shortness of breath, but this is chronic and has been unchanged. Her vital signs are unremarkable. Patient initiated on IV heparin and will require hospitalization based on her large clot burden.  Gilda Crease, MD 12/22/12 1735  Gilda Crease, MD 12/22/12 1757

## 2012-12-22 NOTE — Progress Notes (Signed)
ANTICOAGULATION CONSULT NOTE - Initial Consult  Pharmacy Consult for Heparin Indication: pulmonary embolus  Allergies  Allergen Reactions  . Other Other (See Comments)    No blood products - pt is Jehovah Witness Effegren caused muscle spasms  . Bacitracin Other (See Comments)    Per pt MAR  . Gelatin Other (See Comments)    Per pt MAR  . Imuran [Azathioprine] Other (See Comments)    Per pt MAR  . Methylsulfonylmethane Other (See Comments)    Per pt MAR  . Metoclopramide Hcl Other (See Comments)    Per pt MAR  . Nabumetone Other (See Comments)    Per pt MAR  . Neomycin-Bacitracin Zn-Polymyx Other (See Comments)    Per pt MAR  . Nitroglycerin Other (See Comments)    Per pt MAR  . Nsaids Other (See Comments)    Per pt MAR  . Nutritional Supplements Other (See Comments)    Glycernat - per pt MAR  . Plaquenil [Hydroxychloroquine Sulfate] Other (See Comments)    Per pt MAR  . Tizanidine     Lowers blood pressure    Patient Measurements: Height: 5' 2.99" (160 cm) Weight: 153 lb 7 oz (69.6 kg) IBW/kg (Calculated) : 52.38 Heparin Dosing Weight: 66.5kg  Vital Signs: Temp: 98.4 F (36.9 C) (08/27 1536) Temp src: Oral (08/27 1536) BP: 123/62 mmHg (08/27 1730) Pulse Rate: 61 (08/27 1730)  Labs:  Recent Labs  12/22/12 1620  HGB 12.2  HCT 37.0  PLT 217  CREATININE 1.14*  TROPONINI <0.30    Estimated Creatinine Clearance: 35.6 ml/min (by C-G formula based on Cr of 1.14).   Medical History: Past Medical History  Diagnosis Date  . LBBB (left bundle branch block)   . Nonischemic cardiomyopathy     EF 25-30% echo 2010, 45% echo 2012  . Hypertension   . Diverticulitis   . ICD (implantable cardiac defibrillator) in place     CRT-D [employer] Jude  . Chronic fatigue   . Fibromyalgia   . Shortness of breath     followed by Dr. Shelle Iron  . Elevated uric acid   . CRI (chronic renal insufficiency)   . Abdominal bloating   . Osteopenia   . CHF (congestive heart failure)    . Arthritis   . COPD (chronic obstructive pulmonary disease)   . Asthma   . Non-small cell lung cancer     Medications:  Scheduled:  Infusions:   Assessment: 79 yo female with lung cancer, found to have PE on routine CT scan. Admit and start IV heparin per pharmacy.   Goal of Therapy:  Heparin level 0.3-0.7 units/ml Monitor platelets by anticoagulation protocol: Yes   Plan:   Heparin 3000 units IV bolus, then  Heparin 1000 units/hr  Check heparin level in 8hrs  Daily heparin level, CBC  F/U plans for long-term anticoagulation - LMWH alone is preferred with active cancer per CHEST Guidelines  Loralee Pacas, PharmD, BCPS Pager: [phone] 12/22/2012,5:49 PM

## 2012-12-22 NOTE — Progress Notes (Signed)
The patient had a CT scan earlier today for followup for her diagnosis of non-small cell lung cancer. A large saddle pulmonary embolism was found on this scan in addition to additional foci of embolism. This report was called to our clinic and my recommendation is for the patient to proceed to the emergency room for further evaluation. Nursing staff will contact the patient with these instructions.

## 2012-12-22 NOTE — H&P (Signed)
[facility]          History and Physical    PCP:   Karlene Einstein, MD   Chief Complaint:  Abnormal CT scan.  HPI: Patient is a very pleasant 79 year old white woman with history of systolic CHF with an ejection fraction of 25-30% who is status post AICD placement, she has also been diagnosed with left lower lobe lung cancer and has been undergoing radiation treatment which resulted in severe radiation esophagitis. She went for a followup CT scan today as per Dr. Mitzi Hansen, her radiation oncologist, and was asked to come back to the emergency department when she was found to have a large PE with saddle embolus. Patient has not been symptomatic in that she has not had any chest pain, no shortness of breath above her baseline, has not felt palpitations, is not hypoxemic, however she does relate that about a week ago she had significant pain behind her left knee that has since resolved. We have been asked to admit her for further evaluation and management.  Allergies:   Allergies  Allergen Reactions  . Other Other (See Comments)    No blood products - pt is Jehovah Witness Effegren caused muscle spasms  . Bacitracin Other (See Comments)    Per pt MAR  . Gelatin Other (See Comments)    Per pt MAR  . Imuran [Azathioprine] Other (See Comments)    Per pt MAR  . Methylsulfonylmethane Other (See Comments)    Per pt MAR  . Metoclopramide Hcl Other (See Comments)    Per pt MAR  . Nabumetone Other (See Comments)    Per pt MAR  . Neomycin-Bacitracin Zn-Polymyx Other (See Comments)    Per pt MAR  . Nitroglycerin Other (See Comments)    Per pt MAR  . Nsaids Other (See Comments)    Per pt MAR  . Nutritional Supplements Other (See Comments)    Glycernat - per pt MAR  . Plaquenil [Hydroxychloroquine Sulfate] Other (See Comments)    Per pt MAR  . Tizanidine     Lowers blood pressure      Past Medical History  Diagnosis Date  . LBBB (left bundle branch block)   .  Nonischemic cardiomyopathy     EF 25-30% echo 2010, 45% echo 2012  . Hypertension   . Diverticulitis   . ICD (implantable cardiac defibrillator) in place     CRT-D [employer]  . Chronic fatigue   . Fibromyalgia   . Shortness of breath     followed by Dr. Shelle Iron  . Elevated uric acid   . CRI (chronic renal insufficiency)   . Abdominal bloating   . Osteopenia   . CHF (congestive heart failure)   . Arthritis   . COPD (chronic obstructive pulmonary disease)   . Asthma   . Non-small cell lung cancer     Past Surgical History  Procedure Laterality Date  . Crt-d-st. jude's    . Replacement total knee    . Abdominal hysterectomy    . Appendectomy    . Neck surgery    . Tonsillectomy    . Neck surgery  02/2007    cervical  . Laser surgery left eye  2013  . Oral surgery/infection in root of teeth  2013  . Video bronchoscopy Bilateral 08/12/2012    Procedure: VIDEO BRONCHOSCOPY WITHOUT FLUORO;  Surgeon: Barbaraann Share, MD;  Location: [facility];  Service: Cardiopulmonary;  Laterality: Bilateral;  . Esophagogastroduodenoscopy N/A 11/05/2012  Procedure: ESOPHAGOGASTRODUODENOSCOPY (EGD);  Surgeon: Vertell Novak., MD;  Location: [facility];  Service: Endoscopy;  Laterality: N/A;    Prior to Admission medications   Medication Sig Start Date End Date Taking? Authorizing Provider  albuterol (PROVENTIL) (5 MG/ML) 0.5% nebulizer solution Take 0.5 mLs (2.5 mg total) by nebulization every 4 (four) hours as needed for wheezing or shortness of breath. 11/08/12  Yes Alison Murray, MD  carvedilol (COREG) 3.125 MG tablet Take 1 tablet (3.125 mg total) by mouth 2 (two) times daily with a meal. 02/05/12  Yes Rollene Rotunda, MD  diazepam (VALIUM) 5 MG tablet Take 5 mg by mouth at bedtime as needed for anxiety or sleep.   Yes Historical Provider, MD  diclofenac (FLECTOR) 1.3 % PTCH Place 1 patch onto the skin daily as needed (for lower back pain).   Yes Historical Provider, MD  formoterol  (FORADIL AEROLIZER) 12 MCG capsule for inhaler Place 1 capsule (12 mcg total) into inhaler and inhale 2 (two) times daily. 08/30/12  Yes Barbaraann Share, MD  furosemide (LASIX) 40 MG tablet Take 1 tablet (40 mg total) by mouth 2 (two) times daily. Take one tablet twice daily 12/09/12  Yes Rollene Rotunda, MD  gabapentin (NEURONTIN) 300 MG capsule Take 300 mg by mouth 2 (two) times daily.    Yes Historical Provider, MD  HYDROcodone-acetaminophen (HYCET) 7.5-325 mg/15 ml solution Take 15 mL by mouth every 4 (four) hours as needed for pain. 12/13/12 12/13/13 Yes Tiffany L Reed, DO  losartan (COZAAR) 25 MG tablet Take 12.5 mg by mouth 2 (two) times daily.   Yes Historical Provider, MD  Multiple Vitamin (MULTIVITAMIN WITH MINERALS) TABS tablet Take 1 tablet by mouth daily.   Yes Historical Provider, MD  oxybutynin (DITROPAN) 5 MG tablet Take 5 mg by mouth daily as needed (for dysuria.).    Yes Historical Provider, MD  pantoprazole (PROTONIX) 40 MG tablet Take 1 tablet (40 mg total) by mouth 2 (two) times daily. 11/08/12  Yes Alison Murray, MD  pravastatin (PRAVACHOL) 40 MG tablet Take 40 mg by mouth every evening.   Yes Historical Provider, MD  vitamin C (ASCORBIC ACID) 500 MG tablet Take 1,000 mg by mouth daily.   Yes Historical Provider, MD  Vitamin D, Ergocalciferol, (DRISDOL) 50000 UNITS CAPS Take 50,000 Units by mouth every 7 (seven) days.     Yes Historical Provider, MD    Social History:  reports that she has never smoked. She has never used smokeless tobacco. She reports that she does not drink alcohol or use illicit drugs.  Family History  Problem Relation Age of Onset  . Heart disease Son   . Colon cancer Sister   . Lung cancer Father   . Heart disease Mother   . Heart disease Brother   . Hypertension Mother   . Diabetes Brother     Review of Systems:  Constitutional: Denies fever, chills, diaphoresis, appetite change and fatigue.  HEENT: Denies photophobia, eye pain, redness, hearing loss,  ear pain, congestion, sore throat, rhinorrhea, sneezing, mouth sores, trouble swallowing, neck pain, neck stiffness and tinnitus.   Respiratory: Denies SOB above her baseline, cough, chest tightness,  and wheezing.   Cardiovascular: Denies chest pain, palpitations and leg swelling.  Gastrointestinal: Denies nausea, vomiting, abdominal pain, diarrhea, constipation, blood in stool and abdominal distention.  Genitourinary: Denies dysuria, urgency, frequency, hematuria, flank pain and difficulty urinating.  Endocrine: Denies: hot or cold intolerance, sweats, changes in hair or nails, polyuria, polydipsia.  Musculoskeletal: Denies myalgias, back pain, joint swelling, arthralgias and gait problem.  Skin: Denies pallor, rash and wound.  Neurological: Denies dizziness, seizures, syncope, weakness, light-headedness, numbness and headaches.  Hematological: Denies adenopathy. Easy bruising, personal or family bleeding history  Psychiatric/Behavioral: Denies suicidal ideation, mood changes, confusion, nervousness, sleep disturbance and agitation   Physical Exam: Blood pressure 157/65, pulse 61, temperature 98.4 F (36.9 C), temperature source Oral, resp. rate 18, height 5' 2.99" (1.6 m), weight 69.6 kg (153 lb 7 oz), SpO2 96.00%. General: Alert, awake, oriented x3, in no acute distress. HEENT: Normocephalic, atraumatic, pupils equal round reactive to light, extraocular movements intact, moist mucous membranes, wears corrective lenses. Neck: Supple, no JVD, no lymphadenopathy, no bruits, no goiter. Cardiovascular: Regular rate and rhythm, no murmurs, rubs or gallops. Lungs: Clear to auscultation bilaterally. Abdomen: Soft, nontender, nondistended, positive bowel sounds, no masses or organomegaly noted. Extremities: 1+ pitting edema bilaterally, positive pedal pulses, negative Homans sign. Neurologic: Grossly intact and nonfocal.  Labs on Admission:  Results for orders placed during the hospital encounter  of 12/22/12 (from the past 48 hour(s))  CBC WITH DIFFERENTIAL     Status: Abnormal   Collection Time    12/22/12  4:20 PM      Result Value Range   WBC 6.4  4.0 - 10.5 K/uL   RBC 3.90  3.87 - 5.11 MIL/uL   Hemoglobin 12.2  12.0 - 15.0 g/dL   HCT 16.1  09.6 - 04.5 %   MCV 94.9  78.0 - 100.0 fL   MCH 31.3  26.0 - 34.0 pg   MCHC 33.0  30.0 - 36.0 g/dL   RDW 40.9 (*) 81.1 - 91.4 %   Platelets 217  150 - 400 K/uL   Neutrophils Relative % 64  43 - 77 %   Neutro Abs 4.1  1.7 - 7.7 K/uL   Lymphocytes Relative 18  12 - 46 %   Lymphs Abs 1.2  0.7 - 4.0 K/uL   Monocytes Relative 13 (*) 3 - 12 %   Monocytes Absolute 0.8  0.1 - 1.0 K/uL   Eosinophils Relative 4  0 - 5 %   Eosinophils Absolute 0.3  0.0 - 0.7 K/uL   Basophils Relative 1  0 - 1 %   Basophils Absolute 0.0  0.0 - 0.1 K/uL  BASIC METABOLIC PANEL     Status: Abnormal   Collection Time    12/22/12  4:20 PM      Result Value Range   Sodium 132 (*) 135 - 145 mEq/L   Potassium 2.9 (*) 3.5 - 5.1 mEq/L   Comment: NO VISIBLE HEMOLYSIS   Chloride 91 (*) 96 - 112 mEq/L   CO2 31  19 - 32 mEq/L   Glucose, Bld 111 (*) 70 - 99 mg/dL   BUN 15  6 - 23 mg/dL   Creatinine, Ser 7.82 (*) 0.50 - 1.10 mg/dL   Calcium 9.2  8.4 - 95.6 mg/dL   GFR calc non Af Amer 44 (*) >90 mL/min   GFR calc Af Amer 50 (*) >90 mL/min   Comment: (NOTE)     The eGFR has been calculated using the CKD EPI equation.     This calculation has not been validated in all clinical situations.     eGFR's persistently <90 mL/min signify possible Chronic Kidney     Disease.  TROPONIN I     Status: None   Collection Time    12/22/12  4:20 PM  Result Value Range   Troponin I <0.30  <0.30 ng/mL   Comment:            Due to the release kinetics of cTnI,     a negative result within the first hours     of the onset of symptoms does not rule out     myocardial infarction with certainty.     If myocardial infarction is still suspected,     repeat the test at appropriate  intervals.  PRO B NATRIURETIC PEPTIDE     Status: None   Collection Time    12/22/12  4:20 PM      Result Value Range   Pro B Natriuretic peptide (BNP) 298.6  0 - 450 pg/mL    Radiological Exams on Admission: Ct Chest W Contrast  12/22/2012   *RADIOLOGY REPORT*  Clinical Data: Non-small cell lung cancer post radiation therapy, history COPD, hypertension, nonischemic cardiomyopathy, CHF, asthma  CT CHEST WITH CONTRAST  Technique:  Multidetector CT imaging of the chest was performed following the standard protocol during bolus administration of intravenous contrast. Sagittal and coronal MPR images reconstructed from axial data set.  Contrast: 80mL OMNIPAQUE IOHEXOL 300 MG/ML  SOLN Dilute oral contrast.  Comparison: 06/28/2012, PET CT 07/13/2012  Findings: Left subclavian transvenous pacemaker leads within right atrium, right ventricle and coronary sinus. Atherosclerotic calcifications aorta with mild aneurysmal dilatation of ascending thoracic aorta 4.0 x 4.0 cm image 27. Large saddle embolus identified at bifurcation of right pulmonary artery. Additional bilateral lower lobe pulmonary emboli. Small probable cyst left lobe liver image 45 unchanged. Remaining visualized portions of upper abdomen unremarkable.  Again identified large area of opacity at the medial left lower lobe containing calcified granulomata which may represent a combination of progressive atelectasis and underlying tumor. Borderline enlarged precarinal lymph node 10 mm image 21 previously 11 mm. Borderline enlarged AP window lymph node 10 mm image 23 previously 8 mm. Few additional calcified mediastinal lymph nodes compatible with granulomatous disease. Scattered peribronchial thickening with dependent atelectasis and right lower lobe. Scattered areas of ground-glass opacity in both lungs appear grossly unchanged. No new infiltrate, pleural effusion, or pneumothorax. Bones appear diffusely demineralized  IMPRESSION: Pulmonary embolism  including saddle embolus at the bifurcation of the right pulmonary artery as well emboli in bilateral lower lobes. Small partially loculated left basilar pleural effusion new since previous exam. Increased size of left lower lobe opacity likely representing combination of atelectasis and underlying tumor. Stable upper normal-sized AP window and precarinal lymph nodes. Esophageal wall thickening distally potentially related to radiation therapy change.  Critical Value/emergent results were called by telephone at the time of interpretation on 12/22/2012 at 1356 hours to Sonda Rumble RN at the [facility], who verbally acknowledged these results.   Original Report Authenticated By: Ulyses Southward, M.D.    Assessment/Plan Principal Problem:   PE (pulmonary embolism) Active Problems:   SYSTOLIC HEART FAILURE, CHRONIC   Biventricular implantable cardioverter-defibrillator in situ   Lung cancer, lower lobe   Radiation esophagitis   Pulmonary embolism -With saddle embolus and large clot burden. -Given how hemodynamically stable she is, I believe it is safe to admit her to telemetry and not to step down. -We'll start her on a heparin drip. -Will likely need to be on anticoagulation for the rest of her life. Given her history of lung cancer she may benefit from lifelong Lovenox versus an oral agent such as Coumadin or Xarelto.  Chronic systolic CHF -Appears well compensated at this  time. -Normal saline lock IV fluids.  History of lung cancer and radiation esophagitis -Will need outpatient followup with Dr. Mitzi Hansen once ready for discharge from the hospital.  DVT prophylaxis -On therapeutic anticoagulation.   CODE STATUS -Full code.  Time Spent on Admission: 75 minutes  HERNANDEZ ACOSTA,ESTELA [facility] Pager: [phone] 12/22/2012, 6:25 PM

## 2012-12-22 NOTE — Progress Notes (Signed)
EDCM consulted to see patient regarding home health needs.  Patient reports that she is now active with [place] for home health RN.  She reports, "They are working on getting physical therapy for me.  I would also like to have a nursing aide."  Patient reports that she lives alone in her apartment but, "I have a lot of help from my family."  Patient reports she can feed, dress and take care of herself at home.  She currently has a walker "with a seat", cane, wheelchair, and shower chair at home.  She reports she has a handicapped toilet, and the shower has safety rails in it.  She reports she doesn't usually need the walker to get around, but she uses it toward the end of the day when she becomes tired.  Patient stated she was just discharged from [facility] last Tuesday.  EDCM offered support to patient and family at bedside.  [facility] text [facility] coordinator Venia Minks regarding admission.  No further needs at this time.

## 2012-12-22 NOTE — ED Notes (Signed)
Pt arrived to Ed with a complaint of a blood clot.  Pt seen today for a CT [place] to measure effectiveness of radiation treatment for lung cancer.  Pt was called by the nurse for Dr. Mitzi Hansen and told to present to the Ed due to a visible blood clot on her CT.

## 2012-12-23 ENCOUNTER — Ambulatory Visit: Admission: RE | Admit: 2012-12-23 | Payer: Medicare Other | Source: Ambulatory Visit | Admitting: Radiation Oncology

## 2012-12-23 LAB — BASIC METABOLIC PANEL
BUN: 12 mg/dL (ref 6–23)
Chloride: 99 mEq/L (ref 96–112)
Glucose, Bld: 107 mg/dL — ABNORMAL HIGH (ref 70–99)
Potassium: 2.7 mEq/L — CL (ref 3.5–5.1)
Sodium: 138 mEq/L (ref 135–145)

## 2012-12-23 LAB — CBC
Hemoglobin: 11.1 g/dL — ABNORMAL LOW (ref 12.0–15.0)
MCH: 31.3 pg (ref 26.0–34.0)
MCV: 94.6 fL (ref 78.0–100.0)
RBC: 3.55 MIL/uL — ABNORMAL LOW (ref 3.87–5.11)
WBC: 5.2 10*3/uL (ref 4.0–10.5)

## 2012-12-23 LAB — POTASSIUM: Potassium: 3.4 mEq/L — ABNORMAL LOW (ref 3.5–5.1)

## 2012-12-23 MED ORDER — POTASSIUM CHLORIDE CRYS ER 20 MEQ PO TBCR
40.0000 meq | EXTENDED_RELEASE_TABLET | Freq: Two times a day (BID) | ORAL | Status: AC
  Administered 2012-12-23 – 2012-12-24 (×4): 40 meq via ORAL
  Filled 2012-12-23 (×4): qty 2

## 2012-12-23 MED ORDER — POTASSIUM CHLORIDE 10 MEQ/100ML IV SOLN
10.0000 meq | INTRAVENOUS | Status: AC
  Administered 2012-12-23 (×4): 10 meq via INTRAVENOUS
  Filled 2012-12-23 (×4): qty 100

## 2012-12-23 NOTE — Progress Notes (Addendum)
CARE MANAGEMENT NOTE 12/23/2012  Patient:  LAVONE, BARRIENTES   Account Number:  [id number]  Date Initiated:  12/23/2012  Documentation initiated by:  Ezekiel Ina  Subjective/Objective Assessment:   pt admitted after CT showed Pulmonary Embolism with saddle Embolus     Action/Plan:   from home   Anticipated DC Date:  12/25/2012   Anticipated DC Plan:  HOME W HOME HEALTH SERVICES      DC Planning Services  CM consult      [facility] Choice  Resumption Of Svcs/PTA Provider   Choice offered to / List presented to:             Status of service:  In process, will continue to follow Medicare Important Message given?  NA - LOS <3 / Initial given by admissions (If response is "NO", the following Medicare IM given date fields will be blank) Date Medicare IM given:   Date Additional Medicare IM given:    Discharge Disposition:  HOME W HOME HEALTH SERVICES  Per UR Regulation:  Reviewed for med. necessity/level of care/duration of stay  If discussed at Long Length of Stay Meetings, dates discussed:    Comments:  12/23/12 MMcGibboney, RN, BSN Lovenox coverage check with insurance revealed that pt will need pre authorization. MD will need to call [phone] for pre authorization for Lovenox. Pt was active with [facility] for HHPT, HHRN will need resumption orders.  Chart reviewed.

## 2012-12-23 NOTE — Progress Notes (Addendum)
[facility] PROGRESS NOTE  Heather Bullock ZOX:[id number] DOB: 01/15/1929 DOA: 12/22/2012 PCP: Heather Einstein, MD  Assessment/Plan:  1. Pulmonary embolism  -saddle embolus and large clot burden.  -continue IV heparin today, transition to therapeutic lovenox tomorrow if stable -will need Lifelong anticoagulation -CM consult for Lovenox cost/affordability  2. Chronic systolic CHF EF 25% -Appears well compensated at this time.  -continue PO lasix, Coreg, ARB  3. History of lung cancer and radiation esophagitis  - followup with Dr. Mitzi Bullock  - declines further XRT due to severity of side effects   4. Radiation esophagitis -slowly improving, but PO intake still limited by Dysphagia -Nutrition consult for supplementation  5. COPD -stable, nebs PRN  DVT prophylaxis  -On therapeutic anticoagulation.   Keep on Tele Ambulate tomorrow  Code Status: Full Family Communication:none at bedside Disposition Plan: home when improved    HPI/Subjective: Feels ok, no CP, chronic dyspnea unchanged from prior,  has chronic dysphagia Objective: Filed Vitals:   12/23/12 0639  BP: 114/59  Pulse: 65  Temp: 97.9 F (36.6 C)  Resp: 18    Intake/Output Summary (Last 24 hours) at 12/23/12 1116 Last data filed at 12/23/12 1043  Gross per 24 hour  Intake   1120 ml  Output   1100 ml  Net     20 ml   Filed Weights   12/22/12 1730 12/22/12 1843 12/23/12 0639  Weight: 69.6 kg (153 lb 7 oz) 66.5 kg (146 lb 9.7 oz) 66.8 kg (147 lb 4.3 oz)    Exam:   General:AAOx3  Cardiovascular: S1S2/RRR  Respiratory: fine basilar crcakles  Abdomen: soft, NT, BS present  Musculoskeletal: no edema c/c  Data Reviewed: Basic Metabolic Panel:  Recent Labs Lab 12/22/12 1620 12/23/12 0350  NA 132* 138  K 2.9* 2.7*  CL 91* 99  CO2 31 29  GLUCOSE 111* 107*  BUN 15 12  CREATININE 1.14* 1.02  CALCIUM 9.2 9.0   Liver Function Tests: No results found for this basename: AST, ALT, ALKPHOS,  BILITOT, PROT, ALBUMIN,  in the last 168 hours No results found for this basename: LIPASE, AMYLASE,  in the last 168 hours No results found for this basename: AMMONIA,  in the last 168 hours CBC:  Recent Labs Lab 12/22/12 1620 12/23/12 0350  WBC 6.4 5.2  NEUTROABS 4.1  --   HGB 12.2 11.1*  HCT 37.0 33.6*  MCV 94.9 94.6  PLT 217 177   Cardiac Enzymes:  Recent Labs Lab 12/22/12 1620  TROPONINI <0.30   BNP (last 3 results)  Recent Labs  12/26/11 1043 11/02/12 1627 12/22/12 1620  PROBNP 63.0 325.7 298.6   CBG: No results found for this basename: GLUCAP,  in the last 168 hours  No results found for this or any previous visit (from the past 240 hour(s)).   Studies: Ct Chest W Contrast  12/22/2012   *RADIOLOGY REPORT*  Clinical Data: Non-small cell lung cancer post radiation therapy, history COPD, hypertension, nonischemic cardiomyopathy, CHF, asthma  CT CHEST WITH CONTRAST  Technique:  Multidetector CT imaging of the chest was performed following the standard protocol during bolus administration of intravenous contrast. Sagittal and coronal MPR images reconstructed from axial data set.  Contrast: 80mL OMNIPAQUE IOHEXOL 300 MG/ML  SOLN Dilute oral contrast.  Comparison: 06/28/2012, PET CT 07/13/2012  Findings: Left subclavian transvenous pacemaker leads within right atrium, right ventricle and coronary sinus. Atherosclerotic calcifications aorta with mild aneurysmal dilatation of ascending thoracic aorta 4.0 x 4.0 cm image 27. Large saddle embolus  identified at bifurcation of right pulmonary artery. Additional bilateral lower lobe pulmonary emboli. Small probable cyst left lobe liver image 45 unchanged. Remaining visualized portions of upper abdomen unremarkable.  Again identified large area of opacity at the medial left lower lobe containing calcified granulomata which may represent a combination of progressive atelectasis and underlying tumor. Borderline enlarged precarinal lymph  node 10 mm image 21 previously 11 mm. Borderline enlarged AP window lymph node 10 mm image 23 previously 8 mm. Few additional calcified mediastinal lymph nodes compatible with granulomatous disease. Scattered peribronchial thickening with dependent atelectasis and right lower lobe. Scattered areas of ground-glass opacity in both lungs appear grossly unchanged. No new infiltrate, pleural effusion, or pneumothorax. Bones appear diffusely demineralized  IMPRESSION: Pulmonary embolism including saddle embolus at the bifurcation of the right pulmonary artery as well emboli in bilateral lower lobes. Small partially loculated left basilar pleural effusion new since previous exam. Increased size of left lower lobe opacity likely representing combination of atelectasis and underlying tumor. Stable upper normal-sized AP window and precarinal lymph nodes. Esophageal wall thickening distally potentially related to radiation therapy change.  Critical Value/emergent results were called by telephone at the time of interpretation on 12/22/2012 at 1356 hours to Heather Rumble RN at the [facility], who verbally acknowledged these results.   Original Report Authenticated By: Heather Bullock, M.D.    Scheduled Meds: . carvedilol  3.125 mg Oral BID WC  . furosemide  40 mg Oral BID  . gabapentin  300 mg Oral BID  . losartan  12.5 mg Oral BID  . multivitamin with minerals  1 tablet Oral Daily  . pantoprazole  40 mg Oral BID  . potassium chloride  10 mEq Intravenous Q1 Hr x 4  . potassium chloride  40 mEq Oral BID  . salmeterol  1 puff Inhalation Q12H  . simvastatin  5 mg Oral q1800  . sodium chloride  3 mL Intravenous Q12H  . sodium chloride  3 mL Intravenous Q12H  . vitamin C  1,000 mg Oral Daily  . [START ON 12/27/2012] Vitamin D (Ergocalciferol)  50,000 Units Oral Q7 days   Continuous Infusions: . heparin 1,000 Units/hr (12/22/12 1935)    Principal Problem:   PE (pulmonary embolism) Active Problems:   SYSTOLIC  HEART FAILURE, CHRONIC   Biventricular implantable cardioverter-defibrillator in situ   Lung cancer, lower lobe   Radiation esophagitis    Time spent:36min   Heather Bullock  [facility] Pager [phone] If 7PM-7AM, please contact night-coverage at [email or web address], password [email or web address] 12/23/2012, 11:16 AM  LOS: 1 day

## 2012-12-23 NOTE — Progress Notes (Signed)
CRITICAL VALUE ALERT  Critical value received:  K- 2.7  Date of notification:  12/23/2012   Time of notification:  5:03 AM   Critical value read back:yes  Nurse who received alert:  B.Clelia Croft  MD notified (1st page):  Schorr  Time of first page:  5:03 AM   MD notified (2nd page):  Time of second page:  Responding MD:  Schorr  Time MD responded:  5:16 AM

## 2012-12-23 NOTE — Progress Notes (Signed)
INITIAL NUTRITION ASSESSMENT  DOCUMENTATION CODES Per approved criteria  -Severe malnutrition in the context of chronic illness  Pt meets criteria for SEVERE MALNUTRITION in the context of Chronic illness as evidenced by 10% wt loss in less than 3 months and estimated energy intake <75% of estimated energy requirements for 3 months.   INTERVENTION: Provide Mighty shake once daily Provide Multivitamin with minerals daily Recommend liberalizing diet to regular Encourage PO intake >50% of 3 meals daily  NUTRITION DIAGNOSIS: Inadequate oral intake related to esophagitis as evidenced by 11% wt loss in less than 3 months.   Goal: Pt to meet >/= 90% of their estimated nutrition needs   Monitor:  PO intake Weight Labs  Reason for Assessment: Consult/MST  79 y.o. female  Admitting Dx: PE (pulmonary embolism)  ASSESSMENT: 79 year old white woman with history of systolic CHF with an ejection fraction of 25-30% who is status post AICD placement, she has also been diagnosed with left lower lobe lung cancer and has been undergoing radiation treatment which resulted in severe radiation esophagitis. She went for a followup CT scan today as per Dr. Mitzi Hansen, her radiation oncologist, and was asked to come back to the emergency department when she was found to have a large PE with saddle embolus. Pt reports that for the past few months she has only been eating creamy soups and grits daily due to esophagitis; all food burned as it went down. During the past few months she was receiving MED PASS (nutritional supplement) QID. Pt's usually body weight was 162 lbs 3 months ago and she reports 20 lbs wt loss in the past 3 months. Pt states that yesterday was the first time she was able to eat regular food and she ate about 50% of her breakfast. Pt states she has never eaten a lot- she typically eats 3 small meals daily and reports eating 50% of her tray is a good/normal amount for her. Pt is willing to drink 1  supplement daily. Pt also reports that she doesn't typically follow a heart healthy diet but, she does watch her salt intake and she does not eat high fat foods.    Height: Ht Readings from Last 1 Encounters:  12/22/12 5\' 2"  (1.575 m)    Weight: Wt Readings from Last 1 Encounters:  12/23/12 147 lb 4.3 oz (66.8 kg)    Ideal Body Weight: 110 lbs  % Ideal Body Weight: 134%  Wt Readings from Last 10 Encounters:  12/23/12 147 lb 4.3 oz (66.8 kg)  12/14/12 153 lb 6.4 oz (69.582 kg)  12/09/12 155 lb 6.4 oz (70.489 kg)  11/26/12 153 lb 10.6 oz (69.7 kg)  11/02/12 158 lb (71.668 kg)  11/02/12 158 lb (71.668 kg)  11/02/12 154 lb 14.4 oz (70.262 kg)  10/25/12 156 lb 4.8 oz (70.897 kg)  10/20/12 159 lb 8 oz (72.349 kg)  10/04/12 165 lb 9.6 oz (75.116 kg)    Usual Body Weight: 162 lbs  % Usual Body Weight: 90%  BMI:  Body mass index is 26.93 kg/(m^2).  Estimated Nutritional Needs: Kcal: 9562-1308 Protein: 80-90 grams Fluid: 2 L/day  Skin: WDL  Diet Order: Cardiac  EDUCATION NEEDS: -No education needs identified at this time   Intake/Output Summary (Last 24 hours) at 12/23/12 1004 Last data filed at 12/23/12 0752  Gross per 24 hour  Intake    880 ml  Output    700 ml  Net    180 ml    Last BM: 8/27  Labs:   Recent Labs Lab 12/22/12 1620 12/23/12 0350  NA 132* 138  K 2.9* 2.7*  CL 91* 99  CO2 31 29  BUN 15 12  CREATININE 1.14* 1.02  CALCIUM 9.2 9.0  GLUCOSE 111* 107*    CBG (last 3)  No results found for this basename: GLUCAP,  in the last 72 hours  Scheduled Meds: . carvedilol  3.125 mg Oral BID WC  . furosemide  40 mg Oral BID  . gabapentin  300 mg Oral BID  . losartan  12.5 mg Oral BID  . multivitamin with minerals  1 tablet Oral Daily  . pantoprazole  40 mg Oral BID  . potassium chloride  40 mEq Oral BID  . salmeterol  1 puff Inhalation Q12H  . simvastatin  5 mg Oral q1800  . sodium chloride  3 mL Intravenous Q12H  . sodium chloride  3 mL  Intravenous Q12H  . vitamin C  1,000 mg Oral Daily  . [START ON 12/27/2012] Vitamin D (Ergocalciferol)  50,000 Units Oral Q7 days    Continuous Infusions: . heparin 1,000 Units/hr (12/22/12 1935)    Past Medical History  Diagnosis Date  . LBBB (left bundle branch block)   . Nonischemic cardiomyopathy     EF 25-30% echo 2010, 45% echo 2012  . Hypertension   . Diverticulitis   . ICD (implantable cardiac defibrillator) in place     CRT-D [employer]  . Chronic fatigue   . Fibromyalgia   . Shortness of breath     followed by Dr. Shelle Iron  . Elevated uric acid   . CRI (chronic renal insufficiency)   . Abdominal bloating   . Osteopenia   . CHF (congestive heart failure)   . Arthritis   . COPD (chronic obstructive pulmonary disease)   . Asthma   . Non-small cell lung cancer     Past Surgical History  Procedure Laterality Date  . Crt-d-st. jude's    . Replacement total knee    . Abdominal hysterectomy    . Appendectomy    . Neck surgery    . Tonsillectomy    . Neck surgery  02/2007    cervical  . Laser surgery left eye  2013  . Oral surgery/infection in root of teeth  2013  . Video bronchoscopy Bilateral 08/12/2012    Procedure: VIDEO BRONCHOSCOPY WITHOUT FLUORO;  Surgeon: Barbaraann Share, MD;  Location: [facility];  Service: Cardiopulmonary;  Laterality: Bilateral;  . Esophagogastroduodenoscopy N/A 11/05/2012    Procedure: ESOPHAGOGASTRODUODENOSCOPY (EGD);  Surgeon: Vertell Novak., MD;  Location: [facility];  Service: Endoscopy;  Laterality: N/A;    Ian Malkin RD, LDN Inpatient Clinical Dietitian Pager: [phone] After Hours Pager: [phone]

## 2012-12-23 NOTE — Progress Notes (Signed)
Brief Rx Consult note-f/u  for Heparin  Assessement:  HL= 0.49 units/ml  No Infusion problems or bleeding reported by RN  Plan:  No change  Recheck HL @ 10am today  Lorenza Evangelist 12/23/2012 5:12 AM

## 2012-12-23 NOTE — Progress Notes (Signed)
ANTICOAGULATION CONSULT NOTE - Follow Up  Pharmacy Consult for Heparin Indication: pulmonary embolus  Allergies  Allergen Reactions  . Other Other (See Comments)    No blood products - pt is Jehovah Witness Effegren caused muscle spasms  . Bacitracin Other (See Comments)    Per pt MAR  . Gelatin Other (See Comments)    Per pt MAR  . Imuran [Azathioprine] Other (See Comments)    Per pt MAR  . Methylsulfonylmethane Other (See Comments)    Per pt MAR  . Metoclopramide Hcl Other (See Comments)    Per pt MAR  . Nabumetone Other (See Comments)    Per pt MAR  . Neomycin-Bacitracin Zn-Polymyx Other (See Comments)    Per pt MAR  . Nitroglycerin Other (See Comments)    Per pt MAR  . Nsaids Other (See Comments)    Per pt MAR  . Nutritional Supplements Other (See Comments)    Glycernat - per pt MAR  . Plaquenil [Hydroxychloroquine Sulfate] Other (See Comments)    Per pt MAR  . Tizanidine     Lowers blood pressure    Patient Measurements: Height: 5\' 2"  (157.5 cm) Weight: 147 lb 4.3 oz (66.8 kg) IBW/kg (Calculated) : 50.1 Heparin Dosing Weight: 66.5kg  Vital Signs: Temp: 97.9 F (36.6 C) (08/28 0639) Temp src: Oral (08/28 0639) BP: 114/59 mmHg (08/28 0639) Pulse Rate: 65 (08/28 0639)  Labs:  Recent Labs  12/22/12 1620 12/23/12 0350 12/23/12 0938  HGB 12.2 11.1*  --   HCT 37.0 33.6*  --   PLT 217 177  --   APTT 40*  --   --   LABPROT 13.0  --   --   INR 1.00  --   --   HEPARINUNFRC  --  0.49 0.65  CREATININE 1.14* 1.02  --   TROPONINI <0.30  --   --     Estimated Creatinine Clearance: 38.1 ml/min (by C-G formula based on Cr of 1.02).   Assessment: 79 yo female with lung cancer, found to have PE on routine CT scan. Admitted 8/27 and started on IV heparin per pharmacy. 1st and 2nd heparin levels are in goal range. Plts wnl. No bleeding events reported in chart notes. Plan to transfer to Lovenox tomorrow if patient is stable - noted.  Goal of Therapy:  Heparin  level 0.3-0.7 units/ml Monitor platelets by anticoagulation protocol: Yes   Plan:   Continue Heparin 1000 units/hr  Daily heparin level, CBC  F/U possible transition to Lovenox tomorrow  Darrol Angel, PharmD Pager: [phone] 12/23/2012,11:24 AM

## 2012-12-24 ENCOUNTER — Telehealth: Payer: Self-pay | Admitting: Dietician

## 2012-12-24 LAB — CBC
HCT: 35.5 % — ABNORMAL LOW (ref 36.0–46.0)
MCV: 97.5 fL (ref 78.0–100.0)
RBC: 3.64 MIL/uL — ABNORMAL LOW (ref 3.87–5.11)
WBC: 5.8 10*3/uL (ref 4.0–10.5)

## 2012-12-24 LAB — BASIC METABOLIC PANEL
BUN: 9 mg/dL (ref 6–23)
CO2: 28 mEq/L (ref 19–32)
Chloride: 103 mEq/L (ref 96–112)
Creatinine, Ser: 1.01 mg/dL (ref 0.50–1.10)

## 2012-12-24 MED ORDER — FUROSEMIDE 40 MG PO TABS
40.0000 mg | ORAL_TABLET | Freq: Two times a day (BID) | ORAL | Status: DC
  Administered 2012-12-24 – 2012-12-27 (×6): 40 mg via ORAL
  Filled 2012-12-24 (×8): qty 1

## 2012-12-24 MED ORDER — ENSURE PUDDING PO PUDG
1.0000 | Freq: Two times a day (BID) | ORAL | Status: DC
  Administered 2012-12-25 – 2012-12-27 (×4): 1 via ORAL
  Filled 2012-12-24 (×8): qty 1

## 2012-12-24 MED ORDER — ENOXAPARIN (LOVENOX) PATIENT EDUCATION KIT
PACK | Freq: Once | Status: AC
  Administered 2012-12-24: 12:00:00
  Filled 2012-12-24: qty 1

## 2012-12-24 MED ORDER — FUROSEMIDE 10 MG/ML IJ SOLN
40.0000 mg | Freq: Once | INTRAMUSCULAR | Status: AC
  Administered 2012-12-24: 40 mg via INTRAVENOUS
  Filled 2012-12-24: qty 4

## 2012-12-24 MED ORDER — ENOXAPARIN SODIUM 80 MG/0.8ML ~~LOC~~ SOLN
70.0000 mg | Freq: Once | SUBCUTANEOUS | Status: AC
  Administered 2012-12-24: 70 mg via SUBCUTANEOUS
  Filled 2012-12-24: qty 0.8

## 2012-12-24 MED ORDER — ENOXAPARIN SODIUM 80 MG/0.8ML ~~LOC~~ SOLN
70.0000 mg | Freq: Two times a day (BID) | SUBCUTANEOUS | Status: DC
  Administered 2012-12-24 – 2012-12-26 (×5): 70 mg via SUBCUTANEOUS
  Filled 2012-12-24 (×8): qty 0.8

## 2012-12-24 NOTE — Progress Notes (Signed)
NUTRITION FOLLOW UP  Intervention:   Provide Mighty shake once daily  Provide Ensure Pudding BID Provide Multivitamin with minerals daily  Recommend liberalizing diet to regular  Encourage PO intake >50% of 3 meals daily  Nutrition Dx:   Inadequate oral intake related to esophagitis as evidenced by 11% wt loss in less than 3 months; ongoing- pt eating <25% of meals today (per pt)  Goal:   Pt to meet >/= 90% of their estimated nutrition needs; not met  Monitor:   PO intake; 75% of meals 8/28, <25% of meals 8/29  Assessment:   8/28 :Pt reports that for the past few months she has only been eating creamy soups and grits daily due to esophagitis; all food burned as it went down. During the past few months she was receiving MED PASS (nutritional supplement) QID. Pt's usually body weight was 162 lbs 3 months ago and she reports 20 lbs wt loss in the past 3 months. Pt states that yesterday was the first time she was able to eat regular food and she ate about 50% of her breakfast. Pt states she has never eaten a lot- she typically eats 3 small meals daily and reports eating 50% of her tray is a good/normal amount for her. Pt is willing to drink 1 supplement daily. Pt also reports that she doesn't typically follow a heart healthy diet but, she does watch her salt intake and she does not eat high fat foods.   8/29: RD re-consulted today. Pt reports she had a rough night, was in a lot of pain and didn't sleep well, and has had a very poor appetite today. Pt reports eating <25% at both meals today but states she has been able to swallow well without pain. Pt states that she tried to order soup and quesadilla but was refused both items due to heart healthy diet. Reviewed high fat and high sodium foods for pt to avoid if diet is liberalized. Pt willing to try additional supplements due to poor PO intake.   Height: Ht Readings from Last 1 Encounters:  12/22/12 5\' 2"  (1.575 m)    Weight Status:   Wt  Readings from Last 1 Encounters:  12/24/12 147 lb 4.3 oz (66.8 kg)    Re-estimated needs:  Kcal: 4540-9811  Protein: 80-90 grams  Fluid: 2 L/day  Skin: intact  Diet Order: Cardiac   Intake/Output Summary (Last 24 hours) at 12/24/12 1658 Last data filed at 12/24/12 1500  Gross per 24 hour  Intake    760 ml  Output   2550 ml  Net  -1790 ml    Last BM: 8/27   Labs:   Recent Labs Lab 12/22/12 1620 12/23/12 0350 12/23/12 1600 12/24/12 0500  NA 132* 138  --  138  K 2.9* 2.7* 3.4* 4.1  CL 91* 99  --  103  CO2 31 29  --  28  BUN 15 12  --  9  CREATININE 1.14* 1.02  --  1.01  CALCIUM 9.2 9.0  --  9.2  GLUCOSE 111* 107*  --  109*    CBG (last 3)  No results found for this basename: GLUCAP,  in the last 72 hours  Scheduled Meds: . carvedilol  3.125 mg Oral BID WC  . enoxaparin (LOVENOX) injection  70 mg Subcutaneous Q12H  . furosemide  40 mg Oral BID  . gabapentin  300 mg Oral BID  . losartan  12.5 mg Oral BID  . multivitamin with  minerals  1 tablet Oral Daily  . pantoprazole  40 mg Oral BID  . potassium chloride  40 mEq Oral BID  . salmeterol  1 puff Inhalation Q12H  . simvastatin  5 mg Oral q1800  . sodium chloride  3 mL Intravenous Q12H  . sodium chloride  3 mL Intravenous Q12H  . vitamin C  1,000 mg Oral Daily  . [START ON 12/27/2012] Vitamin D (Ergocalciferol)  50,000 Units Oral Q7 days    Continuous Infusions:   Ian Malkin RD, LDN Inpatient Clinical Dietitian Pager: [phone] After Hours Pager: [phone]

## 2012-12-24 NOTE — Progress Notes (Addendum)
ANTICOAGULATION CONSULT NOTE - Follow Up  Pharmacy Consult for Heparin Indication: pulmonary embolus  Allergies  Allergen Reactions  . Other Other (See Comments)    No blood products - pt is Jehovah Witness Effegren caused muscle spasms  . Bacitracin Other (See Comments)    Per pt MAR  . Gelatin Other (See Comments)    Per pt MAR  . Imuran [Azathioprine] Other (See Comments)    Per pt MAR  . Methylsulfonylmethane Other (See Comments)    Per pt MAR  . Metoclopramide Hcl Other (See Comments)    Per pt MAR  . Nabumetone Other (See Comments)    Per pt MAR  . Neomycin-Bacitracin Zn-Polymyx Other (See Comments)    Per pt MAR  . Nitroglycerin Other (See Comments)    Per pt MAR  . Nsaids Other (See Comments)    Per pt MAR  . Nutritional Supplements Other (See Comments)    Glycernat - per pt MAR  . Plaquenil [Hydroxychloroquine Sulfate] Other (See Comments)    Per pt MAR  . Tizanidine     Lowers blood pressure    Patient Measurements: Height: 5\' 2"  (157.5 cm) Weight: 147 lb 4.3 oz (66.8 kg) IBW/kg (Calculated) : 50.1 Heparin Dosing Weight: 66.5kg  Vital Signs: Temp: 98.3 F (36.8 C) (08/29 0500) Temp src: Oral (08/29 0500) BP: 124/70 mmHg (08/29 0500) Pulse Rate: 80 (08/29 0500)  Labs:  Recent Labs  12/22/12 1620 12/23/12 0350 12/23/12 0938 12/24/12 0500  HGB 12.2 11.1*  --  11.5*  HCT 37.0 33.6*  --  35.5*  PLT 217 177  --  187  APTT 40*  --   --   --   LABPROT 13.0  --   --   --   INR 1.00  --   --   --   HEPARINUNFRC  --  0.49 0.65 0.67  CREATININE 1.14* 1.02  --  1.01  TROPONINI <0.30  --   --   --     Estimated Creatinine Clearance: 38.5 ml/min (by C-G formula based on Cr of 1.01).   Assessment: 79 yo female with lung cancer, found to have PE on routine CT scan. Admitted 8/27 and started on IV heparin per pharmacy. Heparin level remains in goal range. Plts wnl. No bleeding events reported in chart notes. Possible plance to change to Lovenox today -  will await MD assessment.   Goal of Therapy:  Heparin level 0.3-0.7 units/ml Monitor platelets by anticoagulation protocol: Yes   Plan:   Continue Heparin 1000 units/hr  Daily heparin level, CBC  F/U possible transition to Lovenox today  Darrol Angel, PharmD Pager: [phone] 12/24/2012,7:01 AM   ADDENDUM: Order to change IV heparin to SQ Lovenox for PE. Need to stop heparin drip, wait 1 hour, then given the first dose of Lovenox. Coordinated with RN - plan is to stop the heparin drip at 11:30am and give the first dose of Lovenox as 12:30. Will d/c all heparin levels. Monitor CBC at least q72h.  Plan: 1) Stop heparin at 11:30 2) Start Lovenox 1mg /kg q12h at 12:30 3) D/C heparin levels  Darrol Angel, PharmD Pager: [phone] 12/24/2012 10:50 AM

## 2012-12-24 NOTE — Progress Notes (Addendum)
[facility] PROGRESS NOTE  Priyah Schmuck JYN:[id number] DOB: [birth year] DOA: 12/22/2012 PCP: Karlene Einstein, MD  Assessment/Plan:  1. Pulmonary embolism  -saddle embolus and large clot burden.  -symptomatic today with chest pain and worsening dyspnea -Transition from IV heparin to SQ Lovenox today,  -lovenox teaching -will need Lifelong anticoagulation -CM consult for Lovenox cost/affordability, i called insurance, pre-auth not needed  2. Acute on Chronic systolic CHF EF 25% -lasix IV x1 -then continue PO lasix, Coreg, ARB  3. History of lung cancer and radiation esophagitis  - followup with Dr. Mitzi Hansen  - declines further XRT due to severity of side effects   4. Radiation esophagitis -slowly improving, but PO intake still limited by Dysphagia -Nutrition consult for supplementation  5. COPD -stable, nebs PRN  DVT prophylaxis  -On therapeutic anticoagulation.   Keep on Tele Ambulate tomorrow  Code Status: Full Family Communication:none at bedside Disposition Plan: home in couple of days if stable    HPI/Subjective: C.o Shortness of breath and intermittent chest pain today, chronic dyspnea unchanged from prior,  has chronic dysphagia  Objective: Filed Vitals:   12/24/12 0500  BP: 124/70  Pulse: 80  Temp: 98.3 F (36.8 C)  Resp: 20    Intake/Output Summary (Last 24 hours) at 12/24/12 1101 Last data filed at 12/24/12 0900  Gross per 24 hour  Intake    960 ml  Output    850 ml  Net    110 ml   Filed Weights   12/22/12 1843 12/23/12 0639 12/24/12 0500  Weight: 66.5 kg (146 lb 9.7 oz) 66.8 kg (147 lb 4.3 oz) 66.8 kg (147 lb 4.3 oz)    Exam:   General:AAOx3  Cardiovascular: S1S2/RRR  Respiratory: fine basilar crcakles  Abdomen: soft, NT, BS present  Musculoskeletal: no edema c/c  Data Reviewed: Basic Metabolic Panel:  Recent Labs Lab 12/22/12 1620 12/23/12 0350 12/23/12 1600 12/24/12 0500  NA 132* 138  --  138  K 2.9* 2.7* 3.4* 4.1   CL 91* 99  --  103  CO2 31 29  --  28  GLUCOSE 111* 107*  --  109*  BUN 15 12  --  9  CREATININE 1.14* 1.02  --  1.01  CALCIUM 9.2 9.0  --  9.2   Liver Function Tests: No results found for this basename: AST, ALT, ALKPHOS, BILITOT, PROT, ALBUMIN,  in the last 168 hours No results found for this basename: LIPASE, AMYLASE,  in the last 168 hours No results found for this basename: AMMONIA,  in the last 168 hours CBC:  Recent Labs Lab 12/22/12 1620 12/23/12 0350 12/24/12 0500  WBC 6.4 5.2 5.8  NEUTROABS 4.1  --   --   HGB 12.2 11.1* 11.5*  HCT 37.0 33.6* 35.5*  MCV 94.9 94.6 97.5  PLT 217 177 187   Cardiac Enzymes:  Recent Labs Lab 12/22/12 1620  TROPONINI <0.30   BNP (last 3 results)  Recent Labs  12/26/11 1043 11/02/12 1627 12/22/12 1620  PROBNP 63.0 325.7 298.6   CBG: No results found for this basename: GLUCAP,  in the last 168 hours  No results found for this or any previous visit (from the past 240 hour(s)).   Studies: Ct Chest W Contrast  12/22/2012   *RADIOLOGY REPORT*  Clinical Data: Non-small cell lung cancer post radiation therapy, history COPD, hypertension, nonischemic cardiomyopathy, CHF, asthma  CT CHEST WITH CONTRAST  Technique:  Multidetector CT imaging of the chest was performed following the standard protocol during  bolus administration of intravenous contrast. Sagittal and coronal MPR images reconstructed from axial data set.  Contrast: 80mL OMNIPAQUE IOHEXOL 300 MG/ML  SOLN Dilute oral contrast.  Comparison: 06/28/2012, PET CT 07/13/2012  Findings: Left subclavian transvenous pacemaker leads within right atrium, right ventricle and coronary sinus. Atherosclerotic calcifications aorta with mild aneurysmal dilatation of ascending thoracic aorta 4.0 x 4.0 cm image 27. Large saddle embolus identified at bifurcation of right pulmonary artery. Additional bilateral lower lobe pulmonary emboli. Small probable cyst left lobe liver image 45 unchanged.  Remaining visualized portions of upper abdomen unremarkable.  Again identified large area of opacity at the medial left lower lobe containing calcified granulomata which may represent a combination of progressive atelectasis and underlying tumor. Borderline enlarged precarinal lymph node 10 mm image 21 previously 11 mm. Borderline enlarged AP window lymph node 10 mm image 23 previously 8 mm. Few additional calcified mediastinal lymph nodes compatible with granulomatous disease. Scattered peribronchial thickening with dependent atelectasis and right lower lobe. Scattered areas of ground-glass opacity in both lungs appear grossly unchanged. No new infiltrate, pleural effusion, or pneumothorax. Bones appear diffusely demineralized  IMPRESSION: Pulmonary embolism including saddle embolus at the bifurcation of the right pulmonary artery as well emboli in bilateral lower lobes. Small partially loculated left basilar pleural effusion new since previous exam. Increased size of left lower lobe opacity likely representing combination of atelectasis and underlying tumor. Stable upper normal-sized AP window and precarinal lymph nodes. Esophageal wall thickening distally potentially related to radiation therapy change.  Critical Value/emergent results were called by telephone at the time of interpretation on 12/22/2012 at 1356 hours to Sonda Rumble RN at the [facility], who verbally acknowledged these results.   Original Report Authenticated By: Ulyses Southward, M.D.    Scheduled Meds: . carvedilol  3.125 mg Oral BID WC  . enoxaparin (LOVENOX) injection  70 mg Subcutaneous Once  . enoxaparin (LOVENOX) injection  70 mg Subcutaneous Q12H  . enoxaparin   Does not apply Once  . furosemide  40 mg Oral BID  . gabapentin  300 mg Oral BID  . losartan  12.5 mg Oral BID  . multivitamin with minerals  1 tablet Oral Daily  . pantoprazole  40 mg Oral BID  . potassium chloride  40 mEq Oral BID  . salmeterol  1 puff Inhalation  Q12H  . simvastatin  5 mg Oral q1800  . sodium chloride  3 mL Intravenous Q12H  . sodium chloride  3 mL Intravenous Q12H  . vitamin C  1,000 mg Oral Daily  . [START ON 12/27/2012] Vitamin D (Ergocalciferol)  50,000 Units Oral Q7 days   Continuous Infusions:    Principal Problem:   PE (pulmonary embolism) Active Problems:   SYSTOLIC HEART FAILURE, CHRONIC   Biventricular implantable cardioverter-defibrillator in situ   Lung cancer, lower lobe   Radiation esophagitis    Time spent:48min   Miguelina Fore  [facility] Pager [phone] If 7PM-7AM, please contact night-coverage at [email or web address], password [facility] 12/24/2012, 11:01 AM  LOS: 2 days

## 2012-12-24 NOTE — Progress Notes (Signed)
PER [employer] LOVENOX IS NOT COVERED HOWEVER GENERIC IS AND COPAY FOR 30DAY SUPPLY IS $6.00.

## 2012-12-25 LAB — CBC
HCT: 37.8 % (ref 36.0–46.0)
MCH: 30.7 pg (ref 26.0–34.0)
MCHC: 31.7 g/dL (ref 30.0–36.0)
MCV: 96.7 fL (ref 78.0–100.0)
RDW: 16.1 % — ABNORMAL HIGH (ref 11.5–15.5)

## 2012-12-25 LAB — BASIC METABOLIC PANEL
BUN: 10 mg/dL (ref 6–23)
Creatinine, Ser: 1.15 mg/dL — ABNORMAL HIGH (ref 0.50–1.10)
GFR calc Af Amer: 50 mL/min — ABNORMAL LOW (ref >90)
GFR calc non Af Amer: 43 mL/min — ABNORMAL LOW (ref >90)

## 2012-12-25 NOTE — Progress Notes (Addendum)
[facility] PROGRESS NOTE  Kalani Baray UJW:[id number] DOB: Oct 08, 1928 DOA: 12/22/2012 PCP: Karlene Einstein, MD  Assessment/Plan:  1. Pulmonary embolism  -saddle embolus and large clot burden.  -less symptomatic today, improved chest pain and dyspnea -Transitioned from IV heparin to SQ Lovenox 8/29,  -lovenox teaching today -will need Lifelong anticoagulation -CM consult for Lovenox cost/affordability appreciated, she is able to afford the generic form of lovenox,  i called insurance, pre-auth not needed  2. Acute on Chronic systolic CHF EF 25% -lasix IV [id number] -continue PO lasix, Coreg, ARB  3. History of lung cancer and radiation esophagitis  - followup with Dr. Mitzi Hansen  - declines further XRT due to severity of side effects   4. Radiation esophagitis -slowly improving, but PO intake still limited by Dysphagia -Nutrition consult for supplementation, diet liberalized  5. COPD -stable, nebs PRN  DVT prophylaxis  -On therapeutic anticoagulation.   Keep on Tele Ambulate today, PT eval  Code Status: Full Family Communication:none at bedside Disposition Plan: home Monday if stable    HPI/Subjective: Breathing and chest pain better  Objective: Filed Vitals:   12/25/12 0607  BP: 106/53  Pulse: 60  Temp: 98 F (36.7 C)  Resp: 18    Intake/Output Summary (Last 24 hours) at 12/25/12 1128 Last data filed at 12/25/12 1023  Gross per 24 hour  Intake   1080 ml  Output   2725 ml  Net  -1645 ml   Filed Weights   12/23/12 0639 12/24/12 0500 12/25/12 0607  Weight: 66.8 kg (147 lb 4.3 oz) 66.8 kg (147 lb 4.3 oz) 65.318 kg (144 lb)    Exam:   General:AAOx3  Cardiovascular: S1S2/RRR  Respiratory: fine basilar crackles  Abdomen: soft, NT, BS present  Musculoskeletal: no edema c/c  Data Reviewed: Basic Metabolic Panel:  Recent Labs Lab 12/22/12 1620 12/23/12 0350 12/23/12 1600 12/24/12 0500 12/25/12 0546  NA 132* 138  --  138 136  K 2.9*  2.7* 3.4* 4.1 4.0  CL 91* 99  --  103 99  CO2 31 29  --  28 28  GLUCOSE 111* 107*  --  109* 105*  BUN 15 12  --  9 10  CREATININE 1.14* 1.02  --  1.01 1.15*  CALCIUM 9.2 9.0  --  9.2 9.9   Liver Function Tests: No results found for this basename: AST, ALT, ALKPHOS, BILITOT, PROT, ALBUMIN,  in the last 168 hours No results found for this basename: LIPASE, AMYLASE,  in the last 168 hours No results found for this basename: AMMONIA,  in the last 168 hours CBC:  Recent Labs Lab 12/22/12 1620 12/23/12 0350 12/24/12 0500 12/25/12 0546  WBC 6.4 5.2 5.8 5.1  NEUTROABS 4.1  --   --   --   HGB 12.2 11.1* 11.5* 12.0  HCT 37.0 33.6* 35.5* 37.8  MCV 94.9 94.6 97.5 96.7  PLT 217 177 187 179   Cardiac Enzymes:  Recent Labs Lab 12/22/12 1620  TROPONINI <0.30   BNP (last 3 results)  Recent Labs  11/02/12 1627 12/22/12 1620  PROBNP 325.7 298.6   CBG: No results found for this basename: GLUCAP,  in the last 168 hours  No results found for this or any previous visit (from the past 240 hour(s)).   Studies: No results found.  Scheduled Meds: . carvedilol  3.125 mg Oral BID WC  . enoxaparin (LOVENOX) injection  70 mg Subcutaneous Q12H  . feeding supplement  1 Container Oral BID PC  . furosemide  40 mg Oral BID  . gabapentin  300 mg Oral BID  . losartan  12.5 mg Oral BID  . multivitamin with minerals  1 tablet Oral Daily  . pantoprazole  40 mg Oral BID  . salmeterol  1 puff Inhalation Q12H  . simvastatin  5 mg Oral q1800  . sodium chloride  3 mL Intravenous Q12H  . sodium chloride  3 mL Intravenous Q12H  . vitamin C  1,000 mg Oral Daily  . [START ON 12/27/2012] Vitamin D (Ergocalciferol)  50,000 Units Oral Q7 days   Continuous Infusions:    Principal Problem:   PE (pulmonary embolism) Active Problems:   SYSTOLIC HEART FAILURE, CHRONIC   Biventricular implantable cardioverter-defibrillator in situ   Lung cancer, lower lobe   Radiation esophagitis    Time  spent:66min   Korben Carcione  [facility] Pager [phone] If 7PM-7AM, please contact night-coverage at [email or web address], password [facility] 12/25/2012, 11:28 AM  LOS: 3 days

## 2012-12-25 NOTE — Evaluation (Signed)
Physical Therapy Evaluation Patient Details Name: Heather Bullock MRN: [id number] DOB: 21-Sep-1928 Today's Date: 12/25/2012 Time: 4098-1191 PT Time Calculation (min): 13 min  PT Assessment / Plan / Recommendation History of Present Illness  79 y.o. female admitted to [facility] on 12/22/12 with history of systolic CHF with an ejection fraction of 25-30% who is status post AICD placement, she has also been diagnosed with left lower lobe lung cancer and has been undergoing radiation treatment which resulted in severe radiation esophagitis. She went for a followup CT scan today as per Dr. Mitzi Hansen, her radiation oncologist, and was asked to come back to the emergency department when she was found to have a large PE with saddle embolus. Patient has not been symptomatic in that she has not had any chest pain, no shortness of breath above her baseline, has not felt palpitations, is not hypoxemic.    Clinical Impression  Pt is progressing well with mobility and reports she is at her baseline DOE.  She would likely be supervision to mod I if she used her rollator.  We will assess her level of independence with rollator next visit.  PT to follow acutely.      PT Assessment  Patient needs continued PT services    Follow Up Recommendations  No PT follow up;Supervision - Intermittent    Does the patient have the potential to tolerate intense rehabilitation     NA  Barriers to Discharge Other (comment) (None) None    Equipment Recommendations  None recommended by PT    Recommendations for Other Services   NA  Frequency Min 3X/week    Precautions / Restrictions   monitor O2 sats and DOE  Pertinent Vitals/Pain O2 sats on RA with DOE 2-3/4 with gait mid 90s.      Mobility  Bed Mobility Bed Mobility: Sitting - Scoot to Edge of Bed Sitting - Scoot to Edge of Bed: 6: Modified independent (Device/Increase time);With rail Details for Bed Mobility Assistance: used rail to get closer to the side of the bed.  She was  sitting EOB when I entered the room Transfers Transfers: Sit to Stand;Stand to Sit Sit to Stand: 5: Supervision;With upper extremity assist;From bed Stand to Sit: 5: Supervision;With upper extremity assist;To bed Details for Transfer Assistance: supervision for safety Ambulation/Gait Ambulation/Gait Assistance: 4: Min guard Ambulation Distance (Feet): 120 Feet Assistive device: 1 person hand held assist;Other (Comment) (IV pole) Ambulation/Gait Assistance Details: min hand held assist for stability with gait, DOE 3/4 towards the last 50' of gait requiring standing reat breaks.  O2 sats remain in the mid 90s during gait.   Gait Pattern: Step-through pattern;Shuffle        PT Diagnosis: Difficulty walking;Generalized weakness  PT Problem List: Decreased strength;Decreased activity tolerance;Decreased balance;Decreased mobility;Cardiopulmonary status limiting activity PT Treatment Interventions: DME instruction;Gait training;Functional mobility training;Therapeutic activities;Therapeutic exercise;Balance training;Patient/family education;Neuromuscular re-education     PT Goals(Current goals can be found in the care plan section) Acute Rehab PT Goals Patient Stated Goal: to go home and not back to rehab PT Goal Formulation: With patient Time For Goal Achievement: 01/08/13 Potential to Achieve Goals: Good  Visit Information  Last PT Received On: 12/25/12 Assistance Needed: +1 History of Present Illness: 79 y.o. female admitted to [facility] on 12/22/12 with history of systolic CHF with an ejection fraction of 25-30% who is status post AICD placement, she has also been diagnosed with left lower lobe lung cancer and has been undergoing radiation treatment which resulted in severe radiation esophagitis. She  went for a followup CT scan today as per Dr. Mitzi Hansen, her radiation oncologist, and was asked to come back to the emergency department when she was found to have a large PE with saddle embolus.  Patient has not been symptomatic in that she has not had any chest pain, no shortness of breath above her baseline, has not felt palpitations, is not hypoxemic.         Prior Functioning  Home Living Family/patient expects to be discharged to:: Private residence Living Arrangements: Alone Available Help at Discharge: Family;Available PRN/intermittently Type of Home: Apartment Home Access: Level entry Home Layout: One level Home Equipment: Shower seat;Wheelchair - manual;Cane - single point;Walker - standard;Walker - 4 wheels;Grab bars - tub/shower;Grab bars - toilet;Hand held shower head Prior Function Level of Independence: Independent with assistive device(s) Comments: generally independent.  Has housekeeper PRN and someone cooks for her and she re-heats it.   Communication Communication: No difficulties Dominant Hand: Right    Cognition  Cognition Arousal/Alertness: Awake/alert Behavior During Therapy: WFL for tasks assessed/performed Overall Cognitive Status: Within Functional Limits for tasks assessed    Extremity/Trunk Assessment Upper Extremity Assessment Upper Extremity Assessment: Overall WFL for tasks assessed Lower Extremity Assessment Lower Extremity Assessment: Generalized weakness Cervical / Trunk Assessment Cervical / Trunk Assessment: Normal   Balance Balance Balance Assessed: Yes Static Sitting Balance Static Sitting - Balance Support: No upper extremity supported;Feet supported Static Sitting - Level of Assistance: 7: Independent Static Standing Balance Static Standing - Balance Support: No upper extremity supported Static Standing - Level of Assistance: 5: Stand by assistance Dynamic Standing Balance Dynamic Standing - Balance Support: Bilateral upper extremity supported Dynamic Standing - Level of Assistance: 4: Min assist  End of Session PT - End of Session Activity Tolerance: Patient limited by fatigue;Other (comment) (limited by DOE, but this is her  baseline) Patient left: in bed;with call bell/phone within reach (seated EOB)    Lurena Joiner B. Lurline Caver, PT, DPT [phone]   12/25/2012, 4:17 PM

## 2012-12-26 DIAGNOSIS — N189 Chronic kidney disease, unspecified: Secondary | ICD-10-CM

## 2012-12-26 DIAGNOSIS — D638 Anemia in other chronic diseases classified elsewhere: Secondary | ICD-10-CM

## 2012-12-26 DIAGNOSIS — I509 Heart failure, unspecified: Secondary | ICD-10-CM

## 2012-12-26 DIAGNOSIS — N179 Acute kidney failure, unspecified: Secondary | ICD-10-CM

## 2012-12-26 DIAGNOSIS — J449 Chronic obstructive pulmonary disease, unspecified: Secondary | ICD-10-CM

## 2012-12-26 DIAGNOSIS — I2699 Other pulmonary embolism without acute cor pulmonale: Secondary | ICD-10-CM

## 2012-12-26 LAB — CBC
HCT: 38.8 % (ref 36.0–46.0)
MCHC: 32.2 g/dL (ref 30.0–36.0)
RDW: 15.9 % — ABNORMAL HIGH (ref 11.5–15.5)

## 2012-12-26 NOTE — Progress Notes (Signed)
[facility] PROGRESS NOTE  Laiya Wisby WUJ:[id number] DOB: 1928/11/14 DOA: 12/22/2012 PCP: Karlene Einstein, MD  Assessment/Plan:  1. Pulmonary embolism  -saddle embolus and large clot burden.  -more symptomatic today with chest pain -Transitioned from IV heparin to SQ Lovenox 8/29,  -lovenox teaching today -will need Lifelong anticoagulation -CM consult for Lovenox cost/affordability appreciated, she is able to afford the generic form of lovenox,  i called insurance, pre-auth not needed  2. Acute on Chronic systolic CHF EF 25% -lasix IV x1 8/28 -continue PO lasix, Coreg, ARB  3. History of lung cancer and radiation esophagitis  - followup with Dr. Mitzi Hansen  - declines further XRT due to severity of side effects   4. Radiation esophagitis -slowly improving, but PO intake still limited by Dysphagia -Nutrition consult for supplementation, diet liberalized  5. COPD -stable, nebs PRN  DVT prophylaxis  -On therapeutic anticoagulation.   Keep on Tele Ambulate today, PT eval  Code Status: Full Family Communication:none at bedside Disposition Plan: home Monday if stable    HPI/Subjective: More chest pain since overnight, severe and pleuritic  Objective: Filed Vitals:   12/26/12 1300  BP: 106/52  Pulse: 71  Temp: 98 F (36.7 C)  Resp: 18    Intake/Output Summary (Last 24 hours) at 12/26/12 1619 Last data filed at 12/26/12 1400  Gross per 24 hour  Intake    870 ml  Output   1250 ml  Net   -380 ml   Filed Weights   12/24/12 0500 12/25/12 0607 12/26/12 0502  Weight: 66.8 kg (147 lb 4.3 oz) 65.318 kg (144 lb) 65.499 kg (144 lb 6.4 oz)    Exam:   General:AAOx3  Cardiovascular: S1S2/RRR  Respiratory: fine basilar crackles  Abdomen: soft, NT, BS present  Musculoskeletal: no edema c/c  Data Reviewed: Basic Metabolic Panel:  Recent Labs Lab 12/22/12 1620 12/23/12 0350 12/23/12 1600 12/24/12 0500 12/25/12 0546  NA 132* 138  --  138 136  K 2.9*  2.7* 3.4* 4.1 4.0  CL 91* 99  --  103 99  CO2 31 29  --  28 28  GLUCOSE 111* 107*  --  109* 105*  BUN 15 12  --  9 10  CREATININE 1.14* 1.02  --  1.01 1.15*  CALCIUM 9.2 9.0  --  9.2 9.9   Liver Function Tests: No results found for this basename: AST, ALT, ALKPHOS, BILITOT, PROT, ALBUMIN,  in the last 168 hours No results found for this basename: LIPASE, AMYLASE,  in the last 168 hours No results found for this basename: AMMONIA,  in the last 168 hours CBC:  Recent Labs Lab 12/22/12 1620 12/23/12 0350 12/24/12 0500 12/25/12 0546 12/26/12 0433  WBC 6.4 5.2 5.8 5.1 6.0  NEUTROABS 4.1  --   --   --   --   HGB 12.2 11.1* 11.5* 12.0 12.5  HCT 37.0 33.6* 35.5* 37.8 38.8  MCV 94.9 94.6 97.5 96.7 96.5  PLT 217 177 187 179 181   Cardiac Enzymes:  Recent Labs Lab 12/22/12 1620  TROPONINI <0.30   BNP (last 3 results)  Recent Labs  11/02/12 1627 12/22/12 1620  PROBNP 325.7 298.6   CBG: No results found for this basename: GLUCAP,  in the last 168 hours  No results found for this or any previous visit (from the past 240 hour(s)).   Studies: No results found.  Scheduled Meds: . carvedilol  3.125 mg Oral BID WC  . enoxaparin (LOVENOX) injection  70 mg Subcutaneous  Q12H  . feeding supplement  1 Container Oral BID PC  . furosemide  40 mg Oral BID  . gabapentin  300 mg Oral BID  . losartan  12.5 mg Oral BID  . multivitamin with minerals  1 tablet Oral Daily  . pantoprazole  40 mg Oral BID  . salmeterol  1 puff Inhalation Q12H  . simvastatin  5 mg Oral q1800  . sodium chloride  3 mL Intravenous Q12H  . sodium chloride  3 mL Intravenous Q12H  . vitamin C  1,000 mg Oral Daily  . [START ON 12/27/2012] Vitamin D (Ergocalciferol)  50,000 Units Oral Q7 days   Continuous Infusions:    Principal Problem:   PE (pulmonary embolism) Active Problems:   SYSTOLIC HEART FAILURE, CHRONIC   Biventricular implantable cardioverter-defibrillator in situ   Lung cancer, lower lobe    Radiation esophagitis    Time spent:75min   Taisia Fantini  [facility] Pager [phone] If 7PM-7AM, please contact night-coverage at [email or web address], password [facility] 12/26/2012, 4:19 PM  LOS: 4 days

## 2012-12-27 DIAGNOSIS — I5022 Chronic systolic (congestive) heart failure: Secondary | ICD-10-CM

## 2012-12-27 DIAGNOSIS — D630 Anemia in neoplastic disease: Secondary | ICD-10-CM

## 2012-12-27 LAB — CBC
MCH: 31.2 pg (ref 26.0–34.0)
Platelets: 184 10*3/uL (ref 150–400)
RBC: 3.82 MIL/uL — ABNORMAL LOW (ref 3.87–5.11)
WBC: 5.9 10*3/uL (ref 4.0–10.5)

## 2012-12-27 LAB — BASIC METABOLIC PANEL
CO2: 29 mEq/L (ref 19–32)
Calcium: 9.7 mg/dL (ref 8.4–10.5)
GFR calc Af Amer: 43 mL/min — ABNORMAL LOW (ref >90)
Sodium: 133 mEq/L — ABNORMAL LOW (ref 135–145)

## 2012-12-27 MED ORDER — ENOXAPARIN SODIUM 80 MG/0.8ML ~~LOC~~ SOLN: 70.0000 mg | Freq: Every day | SUBCUTANEOUS | Status: AC

## 2012-12-27 MED ORDER — ENOXAPARIN SODIUM 80 MG/0.8ML ~~LOC~~ SOLN
70.0000 mg | SUBCUTANEOUS | Status: DC
  Filled 2012-12-27: qty 0.8

## 2012-12-27 NOTE — Progress Notes (Signed)
COPD GOLD patient: has appt with Dr. Shelle Iron on 01/14/13. Pt given COPD Gold print out and verbalizes understanding of program.

## 2012-12-27 NOTE — Progress Notes (Signed)
Physical Therapy Treatment Patient Details Name: Heather Bullock MRN: [id number] DOB: [birth year] Today's Date: 12/27/2012 Time: 4098-1191 PT Time Calculation (min): 29 min  PT Assessment / Plan / Recommendation  History of Present Illness     PT Comments   Pt very knowledgable of her current condition and limitations.  Assisted OOB to amb to BR to void and wash up, then amb in bathroom.  Pt took appropriate rest breaks and demon good safety cognition.   Follow Up Recommendations  No PT follow up;Supervision - Intermittent     Does the patient have the potential to tolerate intense rehabilitation     Barriers to Discharge        Equipment Recommendations  None recommended by PT    Recommendations for Other Services    Frequency Min 3X/week   Progress towards PT Goals Progress towards PT goals: Progressing toward goals  Plan      Precautions / Restrictions Precautions Precaution Comments: B saddle PE's w/ limited activity tolerance Restrictions Weight Bearing Restrictions: No    Pertinent Vitals/Pain Pt c/o "some" chest pain which she describes as "limited due to her blood clots"    Mobility  Bed Mobility Bed Mobility: Supine to Sit;Sit to Supine Sitting - Scoot to Edge of Bed: 6: Modified independent (Device/Increase time) Sit to Supine: 6: Modified independent (Device/Increase time) Details for Bed Mobility Assistance: increased time Transfers Transfers: Sit to Stand;Stand to Sit Sit to Stand: 6: Modified independent (Device/Increase time);From chair/3-in-1;From bed Stand to Sit: 6: Modified independent (Device/Increase time);To toilet;To bed Details for Transfer Assistance: good safety cognition Ambulation/Gait Ambulation/Gait Assistance: 5: Supervision Ambulation Distance (Feet): 75 Feet Assistive device: Rolling walker Ambulation/Gait Assistance Details: used RW for energy conservation as pt was fatigued from using BR.  RA during gait 94% with HR 96. Pt aware of her  limites and stated "I get tiered easily and have to stop".  Gait Pattern: Step-through pattern Gait velocity: decreased     PT Goals (current goals can now be found in the care plan section)    Visit Information  Last PT Received On: 12/27/12    Subjective Data      Cognition       Balance     End of Session PT - End of Session Equipment Utilized During Treatment: Gait belt Activity Tolerance: Patient tolerated treatment well Patient left: in bed;with call bell/phone within reach   Felecia Shelling  PTA [facility]  Acute  Rehab Pager      [phone]

## 2012-12-27 NOTE — Care Management Note (Signed)
    Page 1 of 2   12/27/2012     3:33:54 PM   CARE MANAGEMENT NOTE 12/27/2012  Patient:  Heather Bullock, Heather Bullock   Account Number:  [id number]  Date Initiated:  12/23/2012  Documentation initiated by:  Ezekiel Ina  Subjective/Objective Assessment:   pt admitted after CT showed Pulmonary Embolism with saddle Embolus     Action/Plan:   from home   Anticipated DC Date:  12/27/2012   Anticipated DC Plan:  HOME W HOME HEALTH SERVICES      DC Planning Services  CM consult      [facility] Choice  Resumption Of Svcs/PTA Provider   Choice offered to / List presented to:  C-1 Patient        HH arranged  HH-1 RN      [facility] agency  [facility]   Status of service:  Completed, signed off Medicare Important Message given?  NA - LOS <3 / Initial given by admissions (If response is "NO", the following Medicare IM given date fields will be blank) Date Medicare IM given:   Date Additional Medicare IM given:    Discharge Disposition:  HOME W HOME HEALTH SERVICES  Per UR Regulation:  Reviewed for med. necessity/level of care/duration of stay  If discussed at Long Length of Stay Meetings, dates discussed:    Comments:  12/27/12 Carney Saxton RN,BSN NCM [phone] D/C HOME W/HHRN-[facility] RESUMED.FAXED W/CONFIRMATION TO [facility] FAX#[phone],SPOKE TO [facility] REP ON PHONE.  12/24/12 MMcGibboney, RN, BSN PER [employer] LOVENOX IS NOT COVERED HOWEVER GENERIC IS AND COPAY FOR 30DAY SUPPLY IS $6.00.   12/23/12 MMcGibboney, RN, BSN Lovenox coverage check with insurance revealed that pt will need pre authorization. MD will need to call [phone] for pre authorization for Lovenox. Pt was active with [facility] for HHPT, HHRN will need resumption orders.  Chart reviewed.

## 2012-12-27 NOTE — Progress Notes (Signed)
ANTICOAGULATION CONSULT NOTE - Follow Up  Pharmacy Consult for Lovenox Indication: pulmonary embolus  Allergies  Allergen Reactions  . Other Other (See Comments)    No blood products - pt is Jehovah Witness Effegren caused muscle spasms  . Bacitracin Other (See Comments)    Per pt MAR  . Gelatin Other (See Comments)    Per pt MAR  . Imuran [Azathioprine] Other (See Comments)    Per pt MAR  . Methylsulfonylmethane Other (See Comments)    Per pt MAR  . Metoclopramide Hcl Other (See Comments)    Per pt MAR  . Nabumetone Other (See Comments)    Per pt MAR  . Neomycin-Bacitracin Zn-Polymyx Other (See Comments)    Per pt MAR  . Nitroglycerin Other (See Comments)    Per pt MAR  . Nsaids Other (See Comments)    Per pt MAR  . Nutritional Supplements Other (See Comments)    Glycernat - per pt MAR  . Plaquenil [Hydroxychloroquine Sulfate] Other (See Comments)    Per pt MAR  . Tizanidine     Lowers blood pressure    Patient Measurements: Height: 5\' 2"  (157.5 cm) Weight: 145 lb 1 oz (65.8 kg) IBW/kg (Calculated) : 50.1 Heparin Dosing Weight: 66.5kg  Vital Signs: Temp: 98.1 F (36.7 C) (09/01 0433) Temp src: Oral (09/01 0433) BP: 106/60 mmHg (09/01 0433) Pulse Rate: 73 (09/01 0433)  Labs:  Recent Labs  12/25/12 0546 12/26/12 0433 12/27/12 0400  HGB 12.0 12.5 11.9*  HCT 37.8 38.8 36.9  PLT 179 181 184  CREATININE 1.15*  --  1.31*    Estimated Creatinine Clearance: 29.5 ml/min (by C-G formula based on Cr of 1.31).   Assessment: 79 yo female with lung cancer, found to have PE on routine CT scan. Admitted 8/27 and started on IV heparin, changed to LMWH on 8/29.     Pt has CKD, SCr has ranged from ~1 - 1.5 over last few months per records in [facility].    9/1 SCr = 1.31, CrCl of 29.5.    Pt currently receiving LMWH 1mg /kg q 12 hours, however for CrCl < 30 ml/min, usual dose is 1mg /kg q 24 hours.  Spoke with Dr. Jomarie Longs regarding risks/benefits of adjusting dose, and ok  to reduce LMWH to 1mg /kg/24 hours.   Spoke with RN to remind pt if d/c'd to fill prescription today as next dose due tonight  Goal of Therapy:  Anti-Xa levels: 0.6-1 unit/ml Monitor platelets by anticoagulation protocol: Yes   Plan:   Lovenox 70mg  SQ q 24 hours  F/u renal function   Haynes Hoehn, PharmD 12/27/2012, 7:59 AM  Pager: [phone]

## 2012-12-27 NOTE — Discharge Summary (Signed)
Physician Discharge Summary  Heather Bullock QMV:[id number] DOB: [birth year] DOA: 12/22/2012  PCP: [name]  Admit date: 12/22/2012 Discharge date: 12/27/2012  Time spent:50 minutes  Recommendations for Outpatient Follow-up:  1. PCP Dr.Ehinger in 1 week 2. Dr. Shelle Iron in 2 weeks  Discharge Diagnoses:  Principal Problem:   PE (pulmonary embolism)-Large clot burden   SYSTOLIC HEART FAILURE, CHRONIC EF 25%   Biventricular implantable cardioverter-defibrillator in situ   Lung cancer, lower lobe   Radiation esophagitis   COPD   Discharge Condition: stable  Diet recommendation:low sodium, heart healthy  Filed Weights   12/25/12 0607 12/26/12 0502 12/27/12 0433  Weight: 65.318 kg (144 lb) 65.499 kg (144 lb 6.4 oz) 65.8 kg (145 lb 1 oz)    History of present illness:  Patient is a very pleasant 79 year old white woman with history of systolic CHF EF of 25-30%, COPD, s/p AICD placement, she has also been diagnosed with left lower lobe lung cancer and has been undergoing radiation treatment which resulted in severe radiation esophagitis. She went for a followup CT scan 8/27 as per Dr. Mitzi Hansen, her radiation oncologist, and was asked to come back to the emergency department when she was found to have a large PE with saddle embolus. Patient has not been symptomatic in that she has not had any chest pain, no shortness of breath above her baseline, has not felt palpitations, was not hypoxemic, however she did relate that about a week prior she had significant pain behind her left knee that has since resolved.   Hospital Course:  . Pulmonary embolism  -saddle embolus and large clot burden, initially was not symptomatic at all, subsequently started having more shortness of breath and intermittent Pleuritic chest pain, which has improved now. -She was started on IV heparin on admission and transitioned to SQ Enoxaparin after 48hours. -Due to concomitant malignancy, Long term Enoxaparin was felt to be the  preferred anticoagulation option, she was transitioned from IV heparin to SQ Lovenox on 8/29,  -she has been stable hemodynamically during 5 days of hospitalization -completed lovenox teaching and is able to self administer the injections prior to discharge.  -will need Lifelong anticoagulation   2. Acute on Chronic systolic CHF EF 25%  -received lasix IV x1 8/28  -improved -continue PO lasix, Coreg, ARB  -FU with [place] cardiology  3. History of lung cancer and radiation esophagitis  - followup with Dr. Mitzi Hansen  - declines further XRT due to severity of side effects   4. Radiation esophagitis  -slowly improving, but PO intake still limited by Dysphagia  -Nutrition consult for supplementation, diet liberalized during hospitalization  5. COPD  -stable, nebs PRN      Discharge Exam: Filed Vitals:   12/27/12 0433  BP: 106/60  Pulse: 73  Temp: 98.1 F (36.7 C)  Resp: 18    General: AAox3, no distress Cardiovascular: S1S2/RRR Respiratory: few fine basilar crackles  Discharge Instructions  Discharge Orders   Future Appointments Provider Department Dept Phone   01/04/2013 11:10 AM [facility]  IMAGING [phone]   Patient should wear two piece clothing and wear no powder or deodorant. Patient should arrive 15 minutes early.   01/04/2013 4:00 PM Duke Salvia, MD [facility] Main Office [place]) [phone]   01/14/2013 3:15 PM Barbaraann Share, MD [facility] [phone]   01/20/2013 10:45 AM Rollene Rotunda, MD [facility] Main Office [facility]) [phone]   Future Orders Complete By Expires   Diet - low  sodium heart healthy  As directed    Increase activity slowly  As directed        Medication List         albuterol (5 MG/ML) 0.5% nebulizer solution  Commonly known as:  PROVENTIL  Take 0.5 mLs (2.5 mg total) by nebulization every 4 (four) hours as needed for wheezing or shortness of breath.     carvedilol 3.125 MG  tablet  Commonly known as:  COREG  Take 1 tablet (3.125 mg total) by mouth 2 (two) times daily with a meal.     diazepam 5 MG tablet  Commonly known as:  VALIUM  Take 5 mg by mouth at bedtime as needed for anxiety or sleep.     diclofenac 1.3 % Ptch  Commonly known as:  FLECTOR  Place 1 patch onto the skin daily as needed (for lower back pain).     enoxaparin 80 MG/0.8ML injection  Commonly known as:  LOVENOX  Inject 0.7 mLs (70 mg total) into the skin daily.     formoterol 12 MCG capsule for inhaler  Commonly known as:  FORADIL AEROLIZER  Place 1 capsule (12 mcg total) into inhaler and inhale 2 (two) times daily.     furosemide 40 MG tablet  Commonly known as:  LASIX  Take 1 tablet (40 mg total) by mouth 2 (two) times daily. Take one tablet twice daily     gabapentin 300 MG capsule  Commonly known as:  NEURONTIN  Take 300 mg by mouth 2 (two) times daily.     HYDROcodone-acetaminophen 7.5-325 mg/15 ml solution  Commonly known as:  HYCET  Take 15 mL by mouth every 4 (four) hours as needed for pain.     losartan 25 MG tablet  Commonly known as:  COZAAR  Take 12.5 mg by mouth 2 (two) times daily.     multivitamin with minerals Tabs tablet  Take 1 tablet by mouth daily.     oxybutynin 5 MG tablet  Commonly known as:  DITROPAN  Take 5 mg by mouth daily as needed (for dysuria.).     pantoprazole 40 MG tablet  Commonly known as:  PROTONIX  Take 1 tablet (40 mg total) by mouth 2 (two) times daily.     pravastatin 40 MG tablet  Commonly known as:  PRAVACHOL  Take 40 mg by mouth every evening.     vitamin C 500 MG tablet  Commonly known as:  ASCORBIC ACID  Take 1,000 mg by mouth daily.     Vitamin D (Ergocalciferol) 50000 UNITS Caps capsule  Commonly known as:  DRISDOL  Take 50,000 Units by mouth every 7 (seven) days.       Allergies  Allergen Reactions  . Other Other (See Comments)    No blood products - pt is Jehovah Witness Effegren caused muscle spasms  .  Bacitracin Other (See Comments)    Per pt MAR  . Gelatin Other (See Comments)    Per pt MAR  . Imuran [Azathioprine] Other (See Comments)    Per pt MAR  . Methylsulfonylmethane Other (See Comments)    Per pt MAR  . Metoclopramide Hcl Other (See Comments)    Per pt MAR  . Nabumetone Other (See Comments)    Per pt MAR  . Neomycin-Bacitracin Zn-Polymyx Other (See Comments)    Per pt MAR  . Nitroglycerin Other (See Comments)    Per pt MAR  . Nsaids Other (See Comments)    Per pt  MAR  . Nutritional Supplements Other (See Comments)    Glycernat - per pt MAR  . Plaquenil [Hydroxychloroquine Sulfate] Other (See Comments)    Per pt MAR  . Tizanidine     Lowers blood pressure       Follow-up Information   Follow up with DASANAYAKA,GAYANI, MD. Schedule an appointment as soon as possible for a visit in 1 week.   Specialty:  Internal Medicine   Contact information:   [address] [place] [address] [phone]       Follow up with Barbaraann Share, MD. Schedule an appointment as soon as possible for a visit in 2 weeks.   Specialty:  Pulmonary Disease   Contact information:   [address] [place] [address] [phone]        The results of significant diagnostics from this hospitalization (including imaging, microbiology, ancillary and laboratory) are listed below for reference.    Significant Diagnostic Studies: Ct Chest W Contrast  12/22/2012   *RADIOLOGY REPORT*  Clinical Data: Non-small cell lung cancer post radiation therapy, history COPD, hypertension, nonischemic cardiomyopathy, CHF, asthma  CT CHEST WITH CONTRAST  Technique:  Multidetector CT imaging of the chest was performed following the standard protocol during bolus administration of intravenous contrast. Sagittal and coronal MPR images reconstructed from axial data set.  Contrast: 80mL OMNIPAQUE IOHEXOL 300 MG/ML  SOLN Dilute oral contrast.  Comparison: 06/28/2012, PET CT 07/13/2012  Findings: Left  subclavian transvenous pacemaker leads within right atrium, right ventricle and coronary sinus. Atherosclerotic calcifications aorta with mild aneurysmal dilatation of ascending thoracic aorta 4.0 x 4.0 cm image 27. Large saddle embolus identified at bifurcation of right pulmonary artery. Additional bilateral lower lobe pulmonary emboli. Small probable cyst left lobe liver image 45 unchanged. Remaining visualized portions of upper abdomen unremarkable.  Again identified large area of opacity at the medial left lower lobe containing calcified granulomata which may represent a combination of progressive atelectasis and underlying tumor. Borderline enlarged precarinal lymph node 10 mm image 21 previously 11 mm. Borderline enlarged AP window lymph node 10 mm image 23 previously 8 mm. Few additional calcified mediastinal lymph nodes compatible with granulomatous disease. Scattered peribronchial thickening with dependent atelectasis and right lower lobe. Scattered areas of ground-glass opacity in both lungs appear grossly unchanged. No new infiltrate, pleural effusion, or pneumothorax. Bones appear diffusely demineralized  IMPRESSION: Pulmonary embolism including saddle embolus at the bifurcation of the right pulmonary artery as well emboli in bilateral lower lobes. Small partially loculated left basilar pleural effusion new since previous exam. Increased size of left lower lobe opacity likely representing combination of atelectasis and underlying tumor. Stable upper normal-sized AP window and precarinal lymph nodes. Esophageal wall thickening distally potentially related to radiation therapy change.  Critical Value/emergent results were called by telephone at the time of interpretation on 12/22/2012 at 1356 hours to Sonda Rumble RN at the [facility], who verbally acknowledged these results.   Original Report Authenticated By: Ulyses Southward, M.D.    Microbiology: No results found for this or any previous visit (from  the past 240 hour(s)).   Labs: Basic Metabolic Panel:  Recent Labs Lab 12/22/12 1620 12/23/12 0350 12/23/12 1600 12/24/12 0500 12/25/12 0546 12/27/12 0400  NA 132* 138  --  138 136 133*  K 2.9* 2.7* 3.4* 4.1 4.0 3.6  CL 91* 99  --  103 99 95*  CO2 31 29  --  28 28 29   GLUCOSE 111* 107*  --  109* 105*  105*  BUN 15 12  --  9 10 13   CREATININE 1.14* 1.02  --  1.01 1.15* 1.31*  CALCIUM 9.2 9.0  --  9.2 9.9 9.7   Liver Function Tests: No results found for this basename: AST, ALT, ALKPHOS, BILITOT, PROT, ALBUMIN,  in the last 168 hours No results found for this basename: LIPASE, AMYLASE,  in the last 168 hours No results found for this basename: AMMONIA,  in the last 168 hours CBC:  Recent Labs Lab 12/22/12 1620 12/23/12 0350 12/24/12 0500 12/25/12 0546 12/26/12 0433 12/27/12 0400  WBC 6.4 5.2 5.8 5.1 6.0 5.9  NEUTROABS 4.1  --   --   --   --   --   HGB 12.2 11.1* 11.5* 12.0 12.5 11.9*  HCT 37.0 33.6* 35.5* 37.8 38.8 36.9  MCV 94.9 94.6 97.5 96.7 96.5 96.6  PLT 217 177 187 179 181 184   Cardiac Enzymes:  Recent Labs Lab 12/22/12 1620  TROPONINI <0.30   BNP: BNP (last 3 results)  Recent Labs  11/02/12 1627 12/22/12 1620  PROBNP 325.7 298.6   CBG: No results found for this basename: GLUCAP,  in the last 168 hours     Signed:  Markise Haymer  [facility] 12/27/2012, 11:42 AM

## 2012-12-27 NOTE — Progress Notes (Signed)
Patient ID: Heather Bullock, female   DOB: [birth year], 79 y.o.   MRN: [id number]        HISTORY & PHYSICAL  DATE: 12/02/2012   FACILITY: [facility]  LEVEL OF CARE: SNF (31)  ALLERGIES:  Allergies  Allergen Reactions  . Other Other (See Comments)    No blood products - pt is Jehovah Witness Effegren caused muscle spasms  . Bacitracin Other (See Comments)    Per pt MAR  . Gelatin Other (See Comments)    Per pt MAR  . Imuran [Azathioprine] Other (See Comments)    Per pt MAR  . Methylsulfonylmethane Other (See Comments)    Per pt MAR  . Metoclopramide Hcl Other (See Comments)    Per pt MAR  . Nabumetone Other (See Comments)    Per pt MAR  . Neomycin-Bacitracin Zn-Polymyx Other (See Comments)    Per pt MAR  . Nitroglycerin Other (See Comments)    Per pt MAR  . Nsaids Other (See Comments)    Per pt MAR  . Nutritional Supplements Other (See Comments)    Glycernat - per pt MAR  . Plaquenil [Hydroxychloroquine Sulfate] Other (See Comments)    Per pt MAR  . Tizanidine     Lowers blood pressure    CHIEF COMPLAINT:  Manage radiation esophagitis, hypertension, and anemia.    HISTORY OF PRESENT ILLNESS:  The patient is an 79 year-old, Caucasian female.    RADIATION ESOPHAGITIS:  The patient was recently diagnosed and was treated with radiation therapy, which was complicated by ulcerative esophagitis.  EGD in July showed severe ulcerative esophagitis.   Therefore, GI recommended PPI therapy and sucralfate.  Patient still complains of abdominal pain, nausea, and food getting stuck in the abdomen.  She denies vomiting.   HTN: Pt 's HTN remains stable.  Denies CP, sob, DOE, pedal edema, headaches, dizziness or visual disturbances.  No complications from the medications currently being used.  Last BP :    128/72.    ANEMIA: The anemia has been stable. The patient denies fatigue, melena or hematochezia. The patient is currently not on iron.   The anemia is secondary to  neoplasm.  Last hemoglobins are:  11.1, 11.9.    PAST MEDICAL HISTORY :  Past Medical History  Diagnosis Date  . LBBB (left bundle branch block)   . Nonischemic cardiomyopathy     EF 25-30% echo 2010, 45% echo 2012  . Hypertension   . Diverticulitis   . ICD (implantable cardiac defibrillator) in place     CRT-D [employer]  . Chronic fatigue   . Fibromyalgia   . Shortness of breath     followed by Dr. Shelle Iron  . Elevated uric acid   . CRI (chronic renal insufficiency)   . Abdominal bloating   . Osteopenia   . CHF (congestive heart failure)   . Arthritis   . COPD (chronic obstructive pulmonary disease)   . Asthma   . Non-small cell lung cancer     PAST SURGICAL HISTORY: Past Surgical History  Procedure Laterality Date  . Crt-d-st. jude's    . Replacement total knee    . Abdominal hysterectomy    . Appendectomy    . Neck surgery    . Tonsillectomy    . Neck surgery  02/2007    cervical  . Laser surgery left eye  2013  . Oral surgery/infection in root of teeth  2013  . Video bronchoscopy Bilateral 08/12/2012  Procedure: VIDEO BRONCHOSCOPY WITHOUT FLUORO;  Surgeon: Barbaraann Share, MD;  Location: [facility];  Service: Cardiopulmonary;  Laterality: Bilateral;  . Esophagogastroduodenoscopy N/A 11/05/2012    Procedure: ESOPHAGOGASTRODUODENOSCOPY (EGD);  Surgeon: Vertell Novak., MD;  Location: [facility];  Service: Endoscopy;  Laterality: N/A;    SOCIAL HISTORY:  reports that she has never smoked. She has never used smokeless tobacco. She reports that she does not drink alcohol or use illicit drugs.  FAMILY HISTORY:  Family History  Problem Relation Age of Onset  . Heart disease Son   . Colon cancer Sister   . Lung cancer Father   . Heart disease Mother   . Heart disease Brother   . Hypertension Mother   . Diabetes Brother     CURRENT MEDICATIONS: Reviewed per [facility]  REVIEW OF SYSTEMS:  See HPI otherwise 14 point ROS is negative.  PHYSICAL EXAMINATION  VS:   T 99       P 60      RR 20      BP 128/72      POX 92% room air        WT (Lb)  GENERAL: no acute distress, normal body habitus SKIN: warm & dry, no suspicious lesions or rashes, no excessive dryness EYES: conjunctivae normal, sclerae normal, normal eye lids MOUTH/THROAT: lips without lesions,no lesions in the mouth,tongue is without lesions,uvula elevates in midline NECK: supple, trachea midline, no neck masses, no thyroid tenderness, no thyromegaly LYMPHATICS: no LAN in the neck, no supraclavicular LAN RESPIRATORY: breathing is even & unlabored, BS CTAB CARDIAC: RRR, no murmur,no extra heart sounds EDEMA/VARICOSITIES:  +2 bilateral lower extremity edema  ARTERIAL:  pedal pulses nonpalpable   GI:  ABDOMEN: abdomen soft, normal BS, no masses, no tenderness  LIVER/SPLEEN: no hepatomegaly, no splenomegaly MUSCULOSKELETAL: HEAD: normal to inspection & palpation BACK: no kyphosis, scoliosis or spinal processes tenderness EXTREMITIES: LEFT UPPER EXTREMITY: full range of motion, normal strength & tone RIGHT UPPER EXTREMITY:  full range of motion, normal strength & tone LEFT LOWER EXTREMITY:  full range of motion, normal strength & tone RIGHT LOWER EXTREMITY: strength intact, range of motion moderate  PSYCHIATRIC: the patient is alert & oriented to person, affect & behavior appropriate  LABS/RADIOLOGY: Thoracic spine x-ray:  No acute fracture.    Lumbar spine x-ray:  No acute fracture.    Sacrum/coccyx x-ray:  No fracture.    CT of the abdomen and pelvis showed left lower lobe airspace disease with small effusion.  Cannot exclude pneumonia.    Acute abdominal series:  No acute findings.  Left lower lobe consolidation, appears chronic.  Nonobstructive bowel gas pattern.    Creatinine 1.2, otherwise BMP normal.    Magnesium 2.5.    Hemoglobin 11.1, MCV 95.6, platelets 177, WBC 4.9.    Urine culture grew E.coli significantly.    ASSESSMENT/PLAN:  Radiation esophagitis.  Continue  current management.  Patient is still having symptoms.    Non-small cell lung cancer.  Status post radiation treatment.   Hypertension.  Stable.    Anemia of neoplasm.  Reassess hemoglobin level.    Severe protein calorie malnutrition.  Continue nutritional supplements.    Check CBC and BMP.    I have reviewed patient's medical records received at admission/from hospitalization.  CPT CODE: 19147

## 2012-12-28 ENCOUNTER — Encounter: Payer: Medicare Other | Admitting: Internal Medicine

## 2012-12-30 ENCOUNTER — Telehealth: Payer: Self-pay | Admitting: Oncology

## 2012-12-30 NOTE — Telephone Encounter (Signed)
Called Heather Bullock to see how she is doing.  She said that she is very weak due to having a blood clot in her lung.  Asked her if she would like to reschedule her follow up appointment with Dr. Mitzi Hansen.  She said she was too weak and was wondering if Dr. Mitzi Hansen could call her.  Advised her that Dr. Mitzi Hansen would be sent a message to call her.  She requested a call in the morning because she has an appointment in the afternoon with her pcp.

## 2012-12-31 ENCOUNTER — Telehealth: Payer: Self-pay | Admitting: Cardiology

## 2012-12-31 ENCOUNTER — Encounter: Payer: Self-pay | Admitting: Radiation Oncology

## 2012-12-31 NOTE — Telephone Encounter (Signed)
That appt date is fine.

## 2012-12-31 NOTE — Telephone Encounter (Signed)
New problem    Patient in hospital for pulmonary embolic. Has upcoming appt on  9/25.  Please advise on if Dr. Bliss Lions think appt should be move up or not

## 2012-12-31 NOTE — Progress Notes (Addendum)
I spoke to the patient on the phone today. She has been discharged after hospitalization for her pulmonary embolism. She has been placed on anti-coagulation and she is seeing her primary care physician today.  The patient states that she really has gone down some since she was last seen by me. She has multiple medical comorbidities and her overall performance status has declined she states. The patient did have endoscopy performed in July which showed significant esophagitis likely secondary to her radiation treatment. This has continued to be an issue for her. It appears that she does better with liquids and she is taking dose on a daily basis.  The patient is interested in possible hospice and she will further discuss this with her primary care physician. She is not very interested in further aggressive treatment. We discussed possible followup options and we did this after reviewing the results of her recent CT scan.  At her request, I did not make any definitive followup plans with her. She is aware that I would be happy to see her at any point, either in a couple of weeks after she further recovers from her hospital stay or possibly in several months after a longer period of time. Either one is fine. The patient will let us know when and if she wishes to schedule followup with me.

## 2012-12-31 NOTE — Telephone Encounter (Signed)
Will forward to MD to see if he wants appt moved up or if 9/25 is OK.

## 2013-01-04 ENCOUNTER — Encounter: Payer: Self-pay | Admitting: Internal Medicine

## 2013-01-04 ENCOUNTER — Ambulatory Visit: Payer: Medicare Other

## 2013-01-04 ENCOUNTER — Ambulatory Visit (INDEPENDENT_AMBULATORY_CARE_PROVIDER_SITE_OTHER): Admitting: Internal Medicine

## 2013-01-04 VITALS — BP 106/61 | HR 64 | Ht 63.0 in | Wt 144.4 lb

## 2013-01-04 DIAGNOSIS — Z95810 Presence of automatic (implantable) cardiac defibrillator: Secondary | ICD-10-CM

## 2013-01-04 DIAGNOSIS — I5022 Chronic systolic (congestive) heart failure: Secondary | ICD-10-CM

## 2013-01-04 NOTE — Progress Notes (Signed)
Patient Care Team: Angela Cox, MD as PCP - General (Internal Medicine) Rollene Rotunda, MD (Cardiology) Duke Salvia, MD (Cardiology) Susy Frizzle, MD (Rheumatology) Cristi Loron, MD (Neurosurgery) Melvyn Novas, MD (Neurology) Barrie Folk, MD (Gastroenterology)   HPI  Heather Bullock is a 79 y.o. female    seen in followup for an ICD implanted initially as part of CRT with subsequen discontinuation of the LV lead. There is initial improvement in her symptoms and then she has been challenged by dyspnea. She has been seen by multiple specialists for this. There is some question as to whether she had neuromuscular disease. It is described in the chart as "of unknown origin".. She has been diagnosed with lung cancer that is not treatable. We have discussed turning of defibrillator.  Past Medical History  Diagnosis Date  . LBBB (left bundle branch block)   . Nonischemic cardiomyopathy     EF 25-30% echo 2010, 45% echo 2012  . Hypertension   . Diverticulitis   . ICD (implantable cardiac defibrillator) in place     CRT-D [employer]  . Chronic fatigue   . Fibromyalgia   . Shortness of breath     followed by Dr. Shelle Iron  . Elevated uric acid   . CRI (chronic renal insufficiency)   . Abdominal bloating   . Osteopenia   . CHF (congestive heart failure)   . Arthritis   . COPD (chronic obstructive pulmonary disease)   . Asthma   . Non-small cell lung cancer     Past Surgical History  Procedure Laterality Date  . Crt-d-st. jude's    . Replacement total knee    . Abdominal hysterectomy    . Appendectomy    . Neck surgery    . Tonsillectomy    . Neck surgery  02/2007    cervical  . Laser surgery left eye  2013  . Oral surgery/infection in root of teeth  2013  . Video bronchoscopy Bilateral 08/12/2012    Procedure: VIDEO BRONCHOSCOPY WITHOUT FLUORO;  Surgeon: Barbaraann Share, MD;  Location: [facility];  Service: Cardiopulmonary;  Laterality: Bilateral;  .  Esophagogastroduodenoscopy N/A 11/05/2012    Procedure: ESOPHAGOGASTRODUODENOSCOPY (EGD);  Surgeon: Vertell Novak., MD;  Location: [facility];  Service: Endoscopy;  Laterality: N/A;    Current Outpatient Prescriptions  Medication Sig Dispense Refill  . albuterol (PROVENTIL) (5 MG/ML) 0.5% nebulizer solution Take 0.5 mLs (2.5 mg total) by nebulization every 4 (four) hours as needed for wheezing or shortness of breath.  20 mL  12  . carvedilol (COREG) 3.125 MG tablet Take 1 tablet (3.125 mg total) by mouth 2 (two) times daily with a meal.  180 tablet  6  . diazepam (VALIUM) 5 MG tablet Take 5 mg by mouth at bedtime as needed for anxiety or sleep.      Marland Kitchen enoxaparin (LOVENOX) 80 MG/0.8ML injection Inject 0.7 mLs (70 mg total) into the skin daily.  30 Syringe  0  . formoterol (FORADIL AEROLIZER) 12 MCG capsule for inhaler Place 1 capsule (12 mcg total) into inhaler and inhale 2 (two) times daily.  60 capsule  6  . furosemide (LASIX) 40 MG tablet Take 1 tablet (40 mg total) by mouth 2 (two) times daily. Take one tablet twice daily  180 tablet  3  . gabapentin (NEURONTIN) 300 MG capsule Take 300 mg by mouth 2 (two) times daily.       Marland Kitchen HYDROcodone-acetaminophen (HYCET) 7.5-325 mg/15 ml solution  Take 15 mL by mouth every 4 (four) hours as needed for pain.  240 mL  5  . losartan (COZAAR) 25 MG tablet Take 12.5 mg by mouth 2 (two) times daily.      . pantoprazole (PROTONIX) 40 MG tablet Take 1 tablet (40 mg total) by mouth 2 (two) times daily.  60 tablet  3  . pravastatin (PRAVACHOL) 40 MG tablet Take 40 mg by mouth every evening.      . vitamin C (ASCORBIC ACID) 500 MG tablet Take 1,000 mg by mouth daily.      . Vitamin D, Ergocalciferol, (DRISDOL) 50000 UNITS CAPS Take 50,000 Units by mouth every 7 (seven) days.         No current facility-administered medications for this visit.    Allergies  Allergen Reactions  . Other Other (See Comments)    No blood products - pt is Jehovah  Witness Effegren caused muscle spasms  . Bacitracin Other (See Comments)    Per pt MAR  . Gelatin Other (See Comments)    Per pt MAR  . Imuran [Azathioprine] Other (See Comments)    Per pt MAR  . Methylsulfonylmethane Other (See Comments)    Per pt MAR  . Metoclopramide Hcl Other (See Comments)    Per pt MAR  . Nabumetone Other (See Comments)    Per pt MAR  . Neomycin-Bacitracin Zn-Polymyx Other (See Comments)    Per pt MAR  . Nitroglycerin Other (See Comments)    Per pt MAR  . Nsaids Other (See Comments)    Per pt MAR  . Nutritional Supplements Other (See Comments)    Glycernat - per pt MAR  . Plaquenil [Hydroxychloroquine Sulfate] Other (See Comments)    Per pt MAR  . Tizanidine     Lowers blood pressure    Review of Systems negative except from HPI and PMH  Physical Exam BP 106/61  Pulse 64  Ht 5\' 3"  (1.6 m)  Wt 144 lb 6.4 oz (65.499 kg)  BMI 25.59 kg/m2 Well developed and nourished in no acute distress HENT normal Neck supple with JVP-flat Clear Regular rate and rhythm, no murmurs or gallops Abd-soft with active BS No Clubbing cyanosis edema Skin-warm and dry A & Oriented  Grossly normal sensory and motor function    Assessment and  Plan

## 2013-01-04 NOTE — Assessment & Plan Note (Signed)
After a lengthy discussion, her defibrillator has been inactivated. This is consistent with a DO NOT RESUSCITATE request

## 2013-01-04 NOTE — Telephone Encounter (Signed)
Heather Bullock aware that Dr. Antoine Poche is ok with the appointment date.

## 2013-01-04 NOTE — Patient Instructions (Addendum)
Your physician recommends that you continue on your current medications as directed. Please refer to the Current Medication list given to you today.  No follow up per Dr. Graciela Husbands

## 2013-01-05 LAB — ICD DEVICE OBSERVATION
AL AMPLITUDE: 1.8 mv
AL THRESHOLD: 0.75 V
DEVICE MODEL ICD: 718227
FVT: 0
HV IMPEDENCE: 44 Ohm
PACEART VT: 0
RV LEAD AMPLITUDE: 12 mv
RV LEAD IMPEDENCE ICD: 512.5 Ohm
TOT-0009: 0
TOT-0010: 13
VF: 0

## 2013-01-14 ENCOUNTER — Encounter: Payer: Self-pay | Admitting: Pulmonary Disease

## 2013-01-14 ENCOUNTER — Ambulatory Visit (INDEPENDENT_AMBULATORY_CARE_PROVIDER_SITE_OTHER): Admitting: Pulmonary Disease

## 2013-01-14 VITALS — BP 110/60 | HR 63 | Temp 98.3°F | Ht 62.5 in | Wt 145.4 lb

## 2013-01-14 DIAGNOSIS — C3430 Malignant neoplasm of lower lobe, unspecified bronchus or lung: Secondary | ICD-10-CM

## 2013-01-14 DIAGNOSIS — J984 Other disorders of lung: Secondary | ICD-10-CM | POA: Insufficient documentation

## 2013-01-14 DIAGNOSIS — J449 Chronic obstructive pulmonary disease, unspecified: Secondary | ICD-10-CM

## 2013-01-14 NOTE — Assessment & Plan Note (Signed)
The patient has restrictive physiology secondary to neuromuscular disease that I suspect is related to her prior cervical spine surgery.  She has been unable to lie flat or sleep because of severe dyspnea, and did very well on bilevel.  She now is having issues with the bilevel, and feels that she sleeps just as well without it.  I suspect that using her hospital bed in the upright position has decreased her need for noninvasive ventilatory support while sleeping.  The patient feels the bilevel is impairing her quality of life at this time, and therefore I have asked her to discontinue.  We can always start back if she begins to have issues again with her sleeping.  She has made it clear that she is moving toward more palliative care, and therefore I will see her back on an as-needed basis.

## 2013-01-14 NOTE — Progress Notes (Signed)
  Subjective:    Patient ID: Heather Bullock, female    DOB: 16-Apr-1929, 79 y.o.   MRN: [id number]  HPI The patient comes in today for followup of her known COPD, as well as restrictive lung disease associated with neuromuscular weakness.  She has also been diagnosed with non-small cell lung cancer, and has been receiving radiation.  This has been complicated by radiation-induced esophagitis.  The patient is staying on her Foradil, and feels that it does help her breathing quite a bit.  She has not been using her bilevel device, but now has a hospital bed that allows her to sleep more in the upright position.  She has tried to bilevel again, but has not been able to tolerate because she feels the pressure is not set appropriately.  She is also been recently diagnosed with an acute pulmonary embolus, for which she is on chronic anticoagulation.  She has also been referred to hospice, and does not want any further aggressive intervention.   Review of Systems  Constitutional: Negative for fever and unexpected weight change.  HENT: Positive for trouble swallowing. Negative for ear pain, nosebleeds, congestion, sore throat, rhinorrhea, sneezing, dental problem, postnasal drip and sinus pressure.   Eyes: Negative for redness and itching.  Respiratory: Positive for shortness of breath. Negative for cough, chest tightness and wheezing.   Cardiovascular: Negative for palpitations and leg swelling.  Gastrointestinal: Negative for nausea and vomiting.  Genitourinary: Negative for dysuria.  Musculoskeletal: Negative for joint swelling.  Skin: Negative for rash.  Neurological: Negative for headaches.  Hematological: Does not bruise/bleed easily.  Psychiatric/Behavioral: Negative for dysphoric mood. The patient is not nervous/anxious.        Objective:   Physical Exam Well-developed female in no acute distress Nose without purulence or discharge noted Neck without lymphadenopathy or thyromegaly Chest with  very diminished breath sounds because of poor depth of inspiration. Cardiac exam with regular rate and rhythm Lower extremities mild edema, no cyanosis Alert and oriented, moves all 4 extremities.       Assessment & Plan:

## 2013-01-14 NOTE — Patient Instructions (Addendum)
Stay on foradil for your copd.  Ok to come off bipap if you feel it is not helping.  If this changes, please let me know so that we can adjust pressure. followup with me as needed.

## 2013-01-14 NOTE — Assessment & Plan Note (Signed)
The patient is doing well on her Foradil, and feels that it does help her.  I've asked her to continue on this medication.

## 2013-01-14 NOTE — Assessment & Plan Note (Signed)
Has been treated with radiation, complicated by radiation esophagitis.  The patient wishes no further treatment, and is now in hospice.

## 2013-01-20 ENCOUNTER — Ambulatory Visit (INDEPENDENT_AMBULATORY_CARE_PROVIDER_SITE_OTHER): Admitting: Cardiology

## 2013-01-20 ENCOUNTER — Encounter: Payer: Self-pay | Admitting: Cardiology

## 2013-01-20 VITALS — BP 107/62 | HR 68 | Ht 62.5 in | Wt 145.0 lb

## 2013-01-20 DIAGNOSIS — I10 Essential (primary) hypertension: Secondary | ICD-10-CM

## 2013-01-20 DIAGNOSIS — I5022 Chronic systolic (congestive) heart failure: Secondary | ICD-10-CM

## 2013-01-20 DIAGNOSIS — Z95810 Presence of automatic (implantable) cardiac defibrillator: Secondary | ICD-10-CM

## 2013-01-20 MED ORDER — FUROSEMIDE 40 MG PO TABS: ORAL_TABLET | ORAL | Status: AC

## 2013-01-20 NOTE — Patient Instructions (Addendum)
Please take Lasix 80 mg in the am and 40 mg in the pm Continue all other medications as listed.  Follow up in 6 months with Dr Antoine Poche.  You will receive a letter in the mail 2 months before you are due.  Please call us when you receive this letter to schedule your follow up appointment.

## 2013-01-20 NOTE — Progress Notes (Signed)
HPI The patient presents for followup of dyspnea.  She has been treated for lung cancer and has had radiation therapy. However, she had severe side effects with this and is refusing further treatment. She is now in hospice. She did have a pulmonary embolism and was hospitalized for this. She actually now is somewhat comfortable. She seems to be a little short of breath than she was previously. Her pain is less than previous. She sleeps in a hospital but she's not describing any PND or orthopnea. She's not having any chest pressure, neck or arm discomfort.  Allergies  Allergen Reactions  . Other Other (See Comments)    No blood products - pt is Jehovah Witness Effegren caused muscle spasms  . Bacitracin Other (See Comments)    Per pt MAR  . Gelatin Other (See Comments)    Per pt MAR  . Imuran [Azathioprine] Other (See Comments)    Per pt MAR  . Methylsulfonylmethane Other (See Comments)    Per pt MAR  . Metoclopramide Hcl Other (See Comments)    Per pt MAR  . Nabumetone Other (See Comments)    Per pt MAR  . Neomycin-Bacitracin Zn-Polymyx Other (See Comments)    Per pt MAR  . Nitroglycerin Other (See Comments)    Per pt MAR  . Nsaids Other (See Comments)    Per pt MAR  . Nutritional Supplements Other (See Comments)    Glycernat - per pt MAR  . Plaquenil [Hydroxychloroquine Sulfate] Other (See Comments)    Per pt MAR  . Tizanidine     Lowers blood pressure    Current Outpatient Prescriptions  Medication Sig Dispense Refill  . carvedilol (COREG) 3.125 MG tablet Take 1 tablet (3.125 mg total) by mouth 2 (two) times daily with a meal.  180 tablet  6  . diazepam (VALIUM) 5 MG tablet Take 5 mg by mouth at bedtime as needed for anxiety or sleep.      Marland Kitchen enoxaparin (LOVENOX) 80 MG/0.8ML injection Inject 0.7 mLs (70 mg total) into the skin daily.  30 Syringe  0  . formoterol (FORADIL AEROLIZER) 12 MCG capsule for inhaler Place 1 capsule (12 mcg total) into inhaler and inhale 2 (two)  times daily.  60 capsule  6  . furosemide (LASIX) 40 MG tablet Take 1 tablet (40 mg total) by mouth 2 (two) times daily. Take one tablet twice daily  180 tablet  3  . gabapentin (NEURONTIN) 300 MG capsule Take 300 mg by mouth 2 (two) times daily.       Marland Kitchen HYDROcodone-acetaminophen (HYCET) 7.5-325 mg/15 ml solution Take 15 mL by mouth every 4 (four) hours as needed for pain.  240 mL  5  . losartan (COZAAR) 25 MG tablet Take 12.5 mg by mouth 2 (two) times daily.      . Multiple Vitamins-Minerals (MULTIVITAMIN WITH MINERALS) tablet Take 1 tablet by mouth daily.      . pantoprazole (PROTONIX) 40 MG tablet Take 1 tablet (40 mg total) by mouth 2 (two) times daily.  60 tablet  3  . pravastatin (PRAVACHOL) 40 MG tablet Take 40 mg by mouth every evening.      . vitamin C (ASCORBIC ACID) 500 MG tablet Take 1,000 mg by mouth daily.      . Vitamin D, Ergocalciferol, (DRISDOL) 50000 UNITS CAPS Take 50,000 Units by mouth every 7 (seven) days.         No current facility-administered medications for this visit.  Past Medical History  Diagnosis Date  . LBBB (left bundle branch block)   . Nonischemic cardiomyopathy     EF 25-30% echo 2010, 45% echo 2012  . Hypertension   . Diverticulitis   . ICD (implantable cardiac defibrillator) in place     CRT-D [employer]  . Chronic fatigue   . Fibromyalgia   . Shortness of breath     followed by Dr. Shelle Iron  . Elevated uric acid   . CRI (chronic renal insufficiency)   . Abdominal bloating   . Osteopenia   . CHF (congestive heart failure)   . Arthritis   . COPD (chronic obstructive pulmonary disease)   . Asthma   . Non-small cell lung cancer     Past Surgical History  Procedure Laterality Date  . Crt-d-st. jude's    . Replacement total knee    . Abdominal hysterectomy    . Appendectomy    . Neck surgery    . Tonsillectomy    . Neck surgery  02/2007    cervical  . Laser surgery left eye  2013  . Oral surgery/infection in root of teeth  2013  .  Video bronchoscopy Bilateral 08/12/2012    Procedure: VIDEO BRONCHOSCOPY WITHOUT FLUORO;  Surgeon: Barbaraann Share, MD;  Location: [facility];  Service: Cardiopulmonary;  Laterality: Bilateral;  . Esophagogastroduodenoscopy N/A 11/05/2012    Procedure: ESOPHAGOGASTRODUODENOSCOPY (EGD);  Surgeon: Vertell Novak., MD;  Location: [facility];  Service: Endoscopy;  Laterality: N/A;    ROS: As stated in the HPI and negative for all other systems.  PHYSICAL EXAM BP 107/62  Pulse 68  Ht 5' 2.5" (1.588 m)  Wt 145 lb (65.772 kg)  BMI 26.08 kg/m2 PHYSICAL EXAM GEN:  No distress NECK:  No jugular venous distention at 90 degrees, waveform within normal limits, carotid upstroke brisk and symmetric, no bruits, no thyromegaly LUNGS:  Clear to auscultation bilaterally BACK:  No CVA tenderness CHEST:  Unremarkable HEART:  S1 and S2 within normal limits, no S3, no S4, no clicks, no rubs, no murmurs ABD:  Positive bowel sounds normal in frequency in pitch, no bruits, no rebound, no guarding, unable to assess midline mass or bruit with the patient seated. EXT:  2 plus pulses throughout, mild edema, no cyanosis no clubbing SKIN:  No rashes no nodules  EKG:  Sinus rhythm, rate 68, left bundle branch block.    ASSESSMENT AN  PLAN  HYPOTENSION -  She tolerated the titration of Cozaar at the last visit. She will remain on the meds as listed.  SYSTOLIC HEART FAILURE, CHRONIC -  She seems to be euvolemic. I will continue the medications with a slight adjustment in her diuretics reducing the PM dose to Lasix 40.we discussed how she might reduce the dose further in the future if she has decreased by mouth intake. We discussed reducing her blood pressure medication/heart failure meds if her blood pressure falls in the future.  DYSPNEA - The etiology of this seems to be multifactorial.  She does not seem to have overt volume overload as above. Medicine changes as above.

## 2013-01-28 ENCOUNTER — Encounter: Payer: Self-pay | Admitting: Radiation Oncology

## 2013-01-31 ENCOUNTER — Other Ambulatory Visit: Payer: Self-pay | Admitting: Radiation Oncology

## 2013-01-31 ENCOUNTER — Encounter: Payer: Self-pay | Admitting: Internal Medicine

## 2013-01-31 NOTE — Progress Notes (Signed)
I had a long conversation today with the patient. She had some questions regarding her last CT scan. This showed possible persistent tumor in the setting of additional consolidation in the left lower lobe. The exact dimensions of the residual tumor I believe are difficult to clearly establish given the surrounding lung changes. I discussed  this with the patient and the role sometimes of additional imaging such as a PET scan to help clarify such situations. However, the patient is not suitable for additional aggressive treatment and she does not wish this. Therefore additional imaging would not change her management. She understands this issue. At this time, the patient does not have metastatic disease and did not have significant regional disease on her imaging. Given the possibility/likelihood of persistent disease, this will likely regress at some rate. It is difficult to say how status is and I discussed this with the patient in detail.  The patient does have ongoing esophagitis. She states that this really has been persistent essentially no substantial improvement over the last month. In reviewing her records, she previously took Carafate but she did not feel that this helped. The patient may benefit from this there's lidocaine for her esophagitis and she gave me contact information for her contact at hospice to discuss this possible addition.   The patient knows to contact us if we can be of further assistance.  She does not wish to proceed any more further management for her lung cancer and therefore no followup was scheduled for her. She is actively followed by hospice

## 2013-02-10 ENCOUNTER — Ambulatory Visit: Admission: RE | Admit: 2013-02-10 | Source: Ambulatory Visit | Admitting: Radiation Oncology

## 2013-03-03 ENCOUNTER — Other Ambulatory Visit: Payer: Self-pay

## 2013-03-28 DEATH — deceased

## 2013-08-05 NOTE — Progress Notes (Signed)
According to [facility] and Record patient deceased as of 03/27/13 .

## 2013-09-30 IMAGING — CT CT ABD-PELV W/ CM
1 of 3 series · 14 of 32 positions shown, 19 images · IV contrast (OMNIPAQUE 300)
Comparison: CT 08/22/2003

CLINICAL DATA: Epigastric pain, nausea, vomiting.

CT ABDOMEN AND PELVIS WITH CONTRAST
TECHNIQUE: Multidetector CT imaging of the abdomen and pelvis was
performed following the standard protocol during bolus
administration of intravenous contrast.
Contrast: 100mL OMNIPAQUE IOHEXOL 300 MG/ML  SOLN,

[Series 2: abd/pel with · axial · 0.79mm/px · z∈[-294,+62]mm · 14 of 81 slices shown, 19 images]
[im 5/81  soft-tissue]
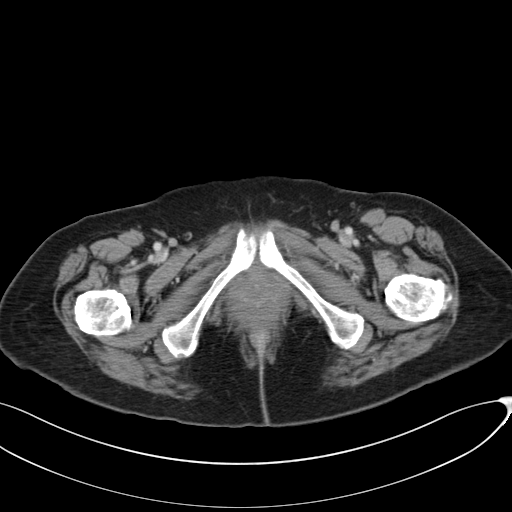
[im 5/81  bone]
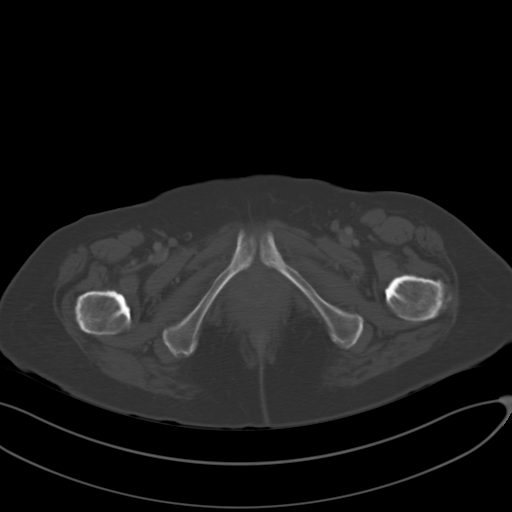
[im 9/81  soft-tissue]
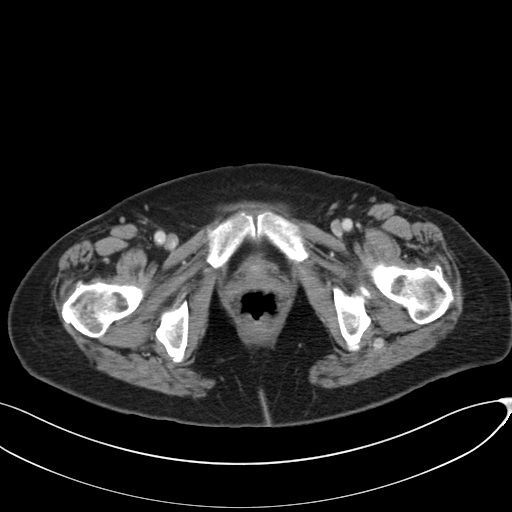
[im 18/81  soft-tissue]
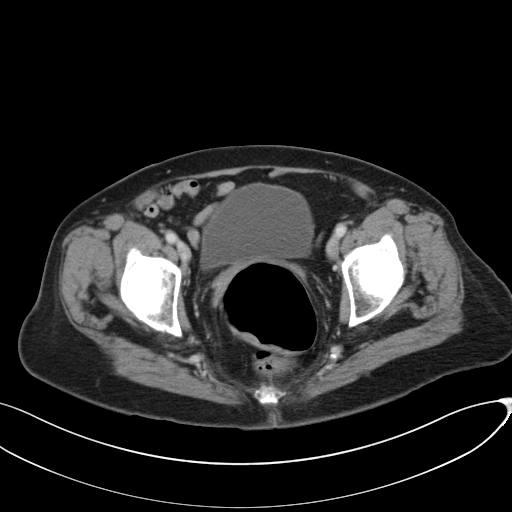
[im 23/81  soft-tissue]
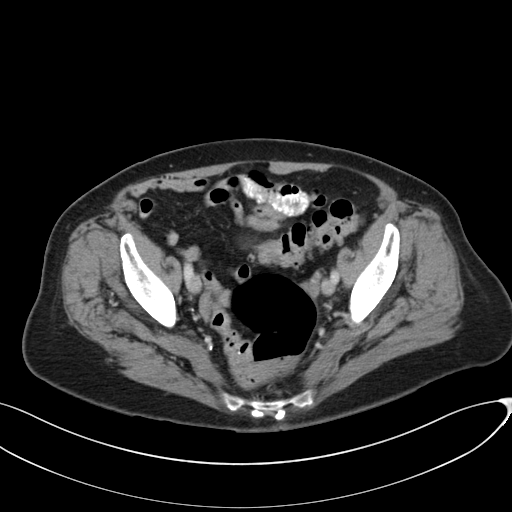
[im 27/81  soft-tissue]
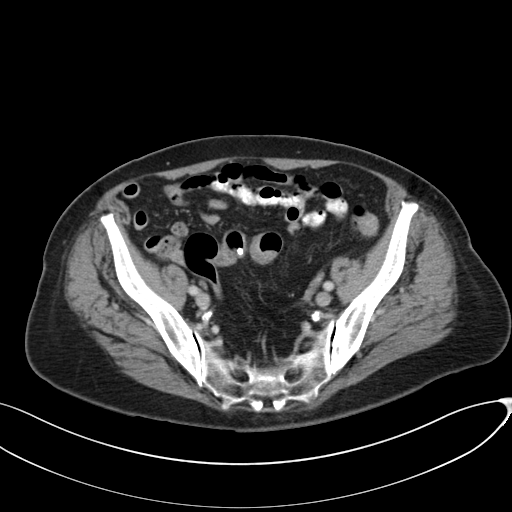
[im 36/81  soft-tissue]
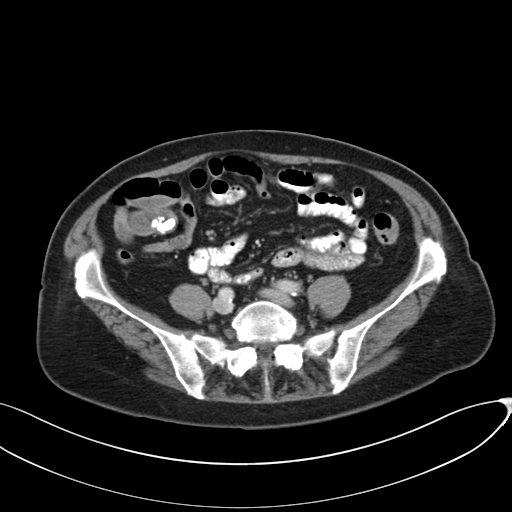
[im 41/81  soft-tissue]
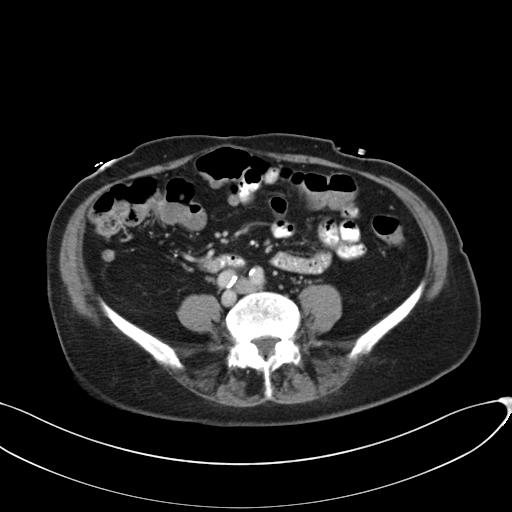
[im 45/81  soft-tissue]
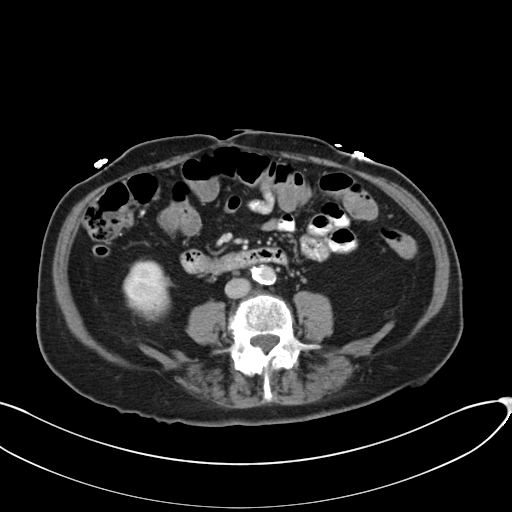
[im 54/81  soft-tissue]
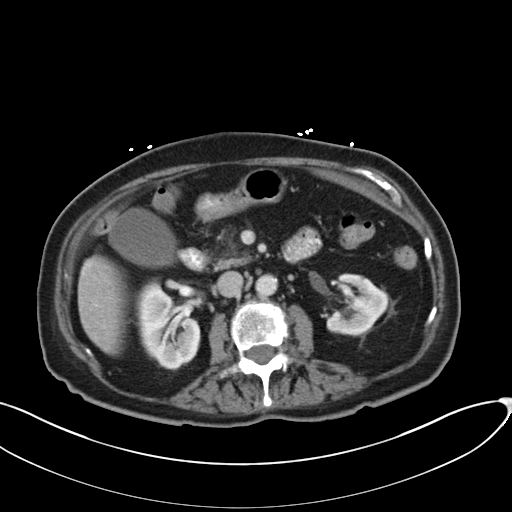
[im 54/81  bone]
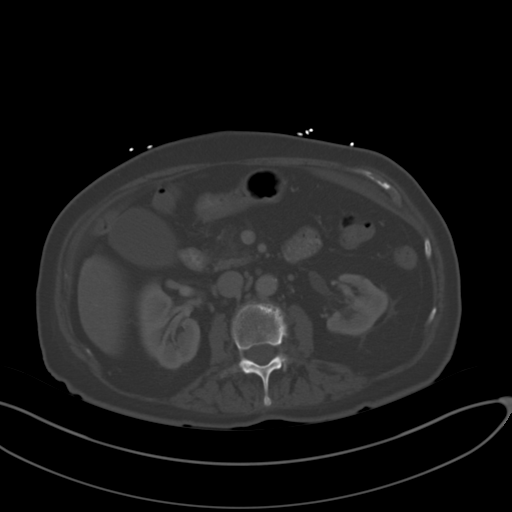
[im 58/81  soft-tissue]
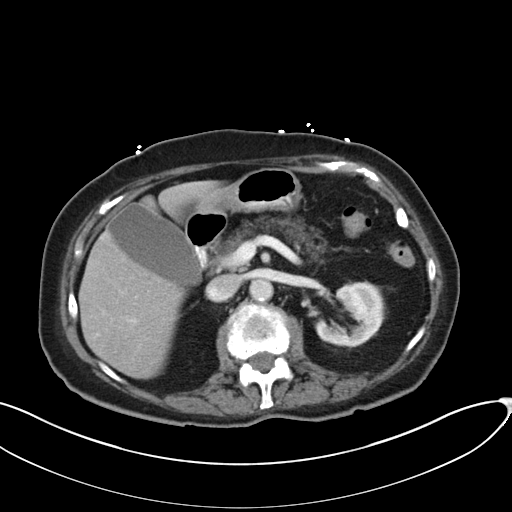
[im 63/81  soft-tissue]
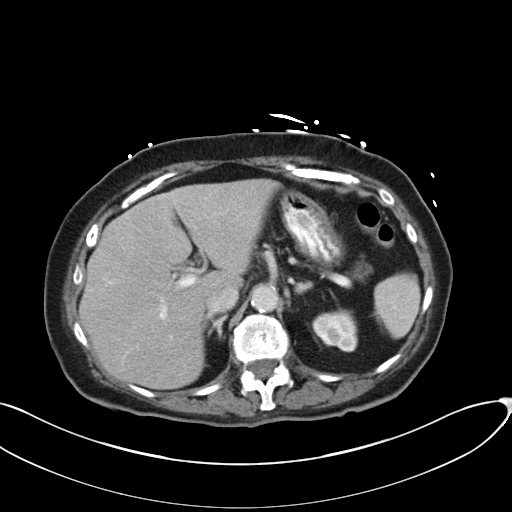
[im 63/81  lung]
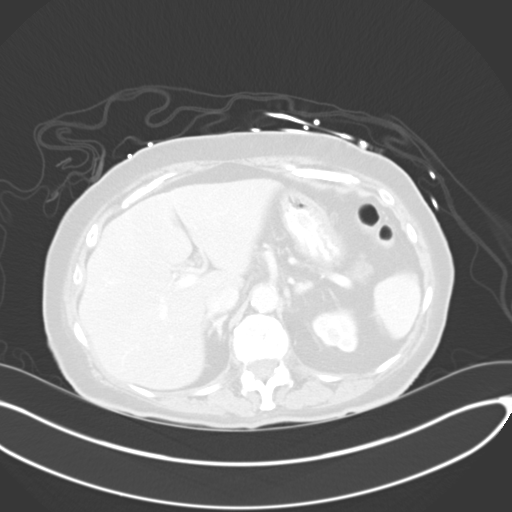
[im 67/81  lung]
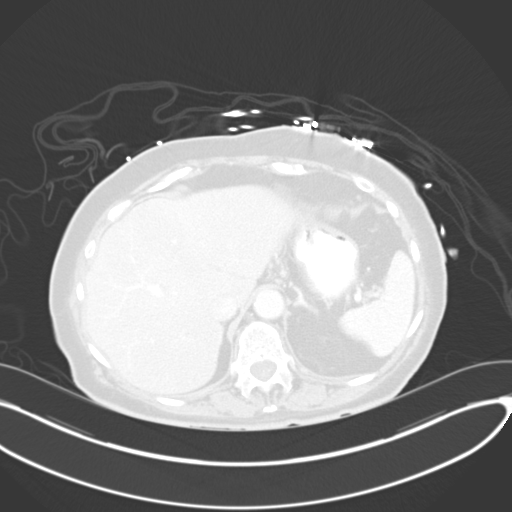
[im 72/81  soft-tissue]
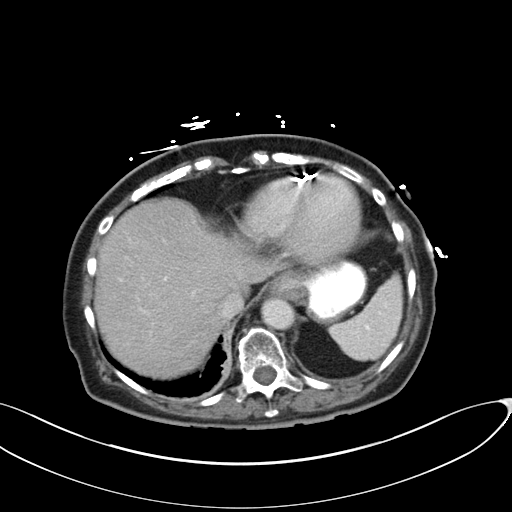
[im 72/81  lung]
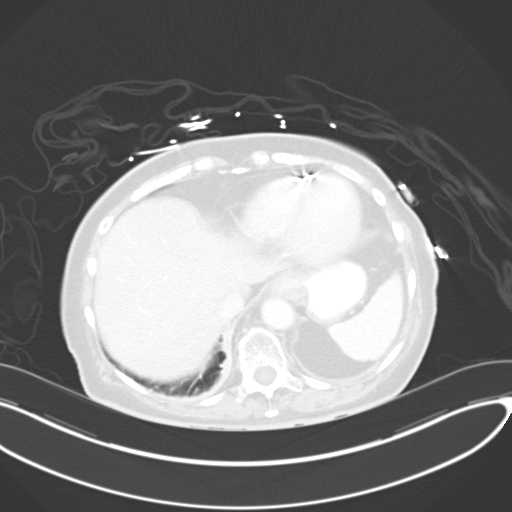
[im 76/81  soft-tissue]
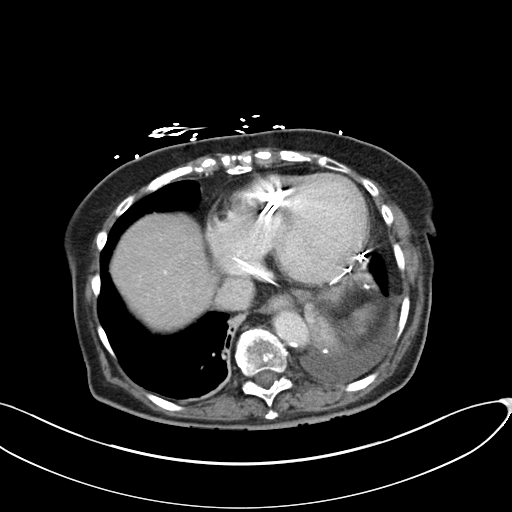
[im 76/81  lung]
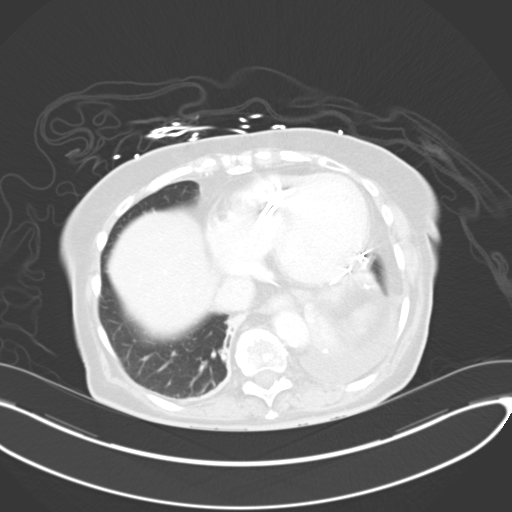

[14 of 32 positions shown; findings below may reference images not displayed]

FINDINGS: Left lower lobe airspace disease and small left pleural
effusion partially imaged.  Minimal atelectasis medially and
posteriorly in the right lung base.  The heart is mildly enlarged.
Pacer wires noted.

Liver, spleen, adrenals and kidneys grossly unremarkable.  There is
mild distention of the gallbladder.  No visible stones, wall
thickening or pericholecystic fluid.  Diffuse atrophy of the
pancreas without focal abnormality.

Sigmoid diverticulosis.  No active diverticulitis.  Small bowel is
decompressed as is the stomach, grossly unremarkable.  Urinary
bladder unremarkable.

Prior hysterectomy.  No adnexal masses.  Aorta and iliac vessels
are calcified, non-aneurysmal.

Degenerative disc and facet disease throughout the lumbar spine.
No acute findings.
IMPRESSION: Left lower lobe airspace disease with small left effusion.  Cannot
exclude pneumonia.

Cardiomegaly.

Sigmoid diverticulosis.

## 2014-02-27 ENCOUNTER — Encounter: Payer: Self-pay | Admitting: Cardiology
